# Patient Record
Sex: Female | Born: 1955 | Race: White | Hispanic: No | Marital: Married | Smoking: Never smoker
Health system: Southern US, Community
[De-identification: ages and names within clinical notes are randomized; demographics above are authoritative.]

## PROBLEM LIST (undated history)

## (undated) DIAGNOSIS — E669 Obesity, unspecified: Secondary | ICD-10-CM

## (undated) DIAGNOSIS — T7840XA Allergy, unspecified, initial encounter: Secondary | ICD-10-CM

## (undated) DIAGNOSIS — E039 Hypothyroidism, unspecified: Secondary | ICD-10-CM

## (undated) DIAGNOSIS — I7300 Raynaud's syndrome without gangrene: Secondary | ICD-10-CM

## (undated) DIAGNOSIS — M1990 Unspecified osteoarthritis, unspecified site: Secondary | ICD-10-CM

## (undated) DIAGNOSIS — I10 Essential (primary) hypertension: Secondary | ICD-10-CM

## (undated) DIAGNOSIS — R0989 Other specified symptoms and signs involving the circulatory and respiratory systems: Secondary | ICD-10-CM

## (undated) DIAGNOSIS — Z8619 Personal history of other infectious and parasitic diseases: Secondary | ICD-10-CM

## (undated) DIAGNOSIS — K5900 Constipation, unspecified: Secondary | ICD-10-CM

## (undated) DIAGNOSIS — R739 Hyperglycemia, unspecified: Secondary | ICD-10-CM

## (undated) DIAGNOSIS — N3281 Overactive bladder: Secondary | ICD-10-CM

## (undated) DIAGNOSIS — Z0000 Encounter for general adult medical examination without abnormal findings: Secondary | ICD-10-CM

## (undated) DIAGNOSIS — F411 Generalized anxiety disorder: Secondary | ICD-10-CM

## (undated) DIAGNOSIS — E1169 Type 2 diabetes mellitus with other specified complication: Secondary | ICD-10-CM

## (undated) DIAGNOSIS — E1165 Type 2 diabetes mellitus with hyperglycemia: Secondary | ICD-10-CM

## (undated) HISTORY — DX: Type 2 diabetes mellitus with hyperglycemia: E11.65

## (undated) HISTORY — DX: Other specified symptoms and signs involving the circulatory and respiratory systems: R09.89

## (undated) HISTORY — DX: Raynaud's syndrome without gangrene: I73.00

## (undated) HISTORY — DX: Constipation, unspecified: K59.00

## (undated) HISTORY — DX: Overactive bladder: N32.81

## (undated) HISTORY — DX: Obesity, unspecified: E66.9

## (undated) HISTORY — DX: Personal history of other infectious and parasitic diseases: Z86.19

## (undated) HISTORY — PX: TONSILLECTOMY: SHX5217

## (undated) HISTORY — DX: Allergy, unspecified, initial encounter: T78.40XA

## (undated) HISTORY — DX: Generalized anxiety disorder: F41.1

## (undated) HISTORY — DX: Essential (primary) hypertension: I10

## (undated) HISTORY — DX: Hyperglycemia, unspecified: R73.9

## (undated) HISTORY — DX: Type 2 diabetes mellitus with other specified complication: E11.69

## (undated) HISTORY — DX: Hypothyroidism, unspecified: E03.9

## (undated) HISTORY — DX: Unspecified osteoarthritis, unspecified site: M19.90

## (undated) HISTORY — DX: Encounter for general adult medical examination without abnormal findings: Z00.00

---

## 2000-02-17 ENCOUNTER — Other Ambulatory Visit: Admission: RE | Admit: 2000-02-17 | Discharge: 2000-02-17 | Payer: Self-pay | Admitting: Family Medicine

## 2000-12-14 HISTORY — PX: ABDOMINAL HYSTERECTOMY: SHX81

## 2010-02-24 LAB — HM COLONOSCOPY: HM Colonoscopy: NORMAL

## 2010-12-16 LAB — HM PAP SMEAR: HM Pap smear: NORMAL

## 2011-02-16 LAB — HM MAMMOGRAPHY: HM Mammogram: NORMAL

## 2012-02-09 ENCOUNTER — Telehealth: Payer: Self-pay | Admitting: Internal Medicine

## 2012-02-09 ENCOUNTER — Encounter: Payer: Self-pay | Admitting: Internal Medicine

## 2012-02-09 ENCOUNTER — Ambulatory Visit (INDEPENDENT_AMBULATORY_CARE_PROVIDER_SITE_OTHER): Payer: BC Managed Care – PPO | Admitting: Internal Medicine

## 2012-02-09 VITALS — BP 112/80 | HR 87 | Temp 98.3°F | Resp 18 | Ht 62.25 in | Wt 187.0 lb

## 2012-02-09 DIAGNOSIS — E039 Hypothyroidism, unspecified: Secondary | ICD-10-CM

## 2012-02-09 DIAGNOSIS — G5600 Carpal tunnel syndrome, unspecified upper limb: Secondary | ICD-10-CM

## 2012-02-09 DIAGNOSIS — Z1211 Encounter for screening for malignant neoplasm of colon: Secondary | ICD-10-CM | POA: Insufficient documentation

## 2012-02-09 DIAGNOSIS — L719 Rosacea, unspecified: Secondary | ICD-10-CM

## 2012-02-09 DIAGNOSIS — Z0000 Encounter for general adult medical examination without abnormal findings: Secondary | ICD-10-CM

## 2012-02-09 DIAGNOSIS — M549 Dorsalgia, unspecified: Secondary | ICD-10-CM

## 2012-02-09 DIAGNOSIS — J45909 Unspecified asthma, uncomplicated: Secondary | ICD-10-CM

## 2012-02-09 DIAGNOSIS — G8929 Other chronic pain: Secondary | ICD-10-CM | POA: Insufficient documentation

## 2012-02-09 HISTORY — DX: Encounter for general adult medical examination without abnormal findings: Z00.00

## 2012-02-09 LAB — CBC WITH DIFFERENTIAL/PLATELET
Basophils Absolute: 0.1 10*3/uL (ref 0.0–0.1)
Basophils Relative: 1 % (ref 0–1)
MCHC: 34.6 g/dL (ref 30.0–36.0)
Monocytes Absolute: 0.7 10*3/uL (ref 0.1–1.0)
Neutro Abs: 5 10*3/uL (ref 1.7–7.7)
Neutrophils Relative %: 65 % (ref 43–77)
RDW: 12.9 % (ref 11.5–15.5)

## 2012-02-09 LAB — BASIC METABOLIC PANEL
Glucose, Bld: 173 mg/dL — ABNORMAL HIGH (ref 70–99)
Potassium: 4.4 mEq/L (ref 3.5–5.3)
Sodium: 138 mEq/L (ref 135–145)

## 2012-02-09 LAB — HEPATIC FUNCTION PANEL
ALT: 34 U/L (ref 0–35)
Albumin: 4.2 g/dL (ref 3.5–5.2)
Bilirubin, Direct: 0.2 mg/dL (ref 0.0–0.3)
Total Protein: 6.4 g/dL (ref 6.0–8.3)

## 2012-02-09 LAB — LIPID PANEL
Cholesterol: 175 mg/dL (ref 0–200)
HDL: 38 mg/dL — ABNORMAL LOW (ref >39)
Total CHOL/HDL Ratio: 4.6 Ratio
VLDL: 23 mg/dL (ref 0–40)

## 2012-02-09 LAB — TSH: TSH: 2.628 u[IU]/mL (ref 0.350–4.500)

## 2012-02-09 LAB — VITAMIN B12: Vitamin B-12: 214 pg/mL (ref 211–911)

## 2012-02-09 MED ORDER — DICLOFENAC SODIUM 50 MG PO TBEC: DELAYED_RELEASE_TABLET | ORAL | Status: AC

## 2012-02-09 NOTE — Assessment & Plan Note (Signed)
Obtain tsh and free t4 

## 2012-02-09 NOTE — Progress Notes (Signed)
  Subjective:    Patient ID: Tiffany Mckenzie, female    DOB: 06-02-1956, 56 y.o.   MRN: [id number]  HPI Pt presents to clinic for annual physical and evaluation of multiple medical problems. Notes h/o chronic lbp without radicular sx's, paresthesia or weakness. Has been tx'ed by two chiropractors and underwent plain radiographs within past several months. Was told she had arthritis/degeneration. Sometimes takes aleve prn. Has not undergone PT. Also complains of intermittent numbness of hands worse at night. No neck pain or radicular arm pain. No other alleviating or exacerbating factors. Mammogram and pap smear utd through gyn.   Past Medical History  Diagnosis Date  . Asthma   . Arthritis   . History of chicken pox   . Allergic rhinitis   . History of hepatitis     age 60  . Hypertension   . Hypothyroidism    Past Surgical History  Procedure Date  . Tonsillectomy   . Abdominal hysterectomy 2002    reports that she has quit smoking. She has never used smokeless tobacco. She reports that she does not drink alcohol or use illicit drugs. family history includes Alcohol abuse in her father; Arthritis in her mother; Breast cancer in her maternal grandmother; Colon cancer in her paternal grandmother; Heart disease in her maternal grandfather and mother; Hypertension in her mother; and Stroke in her paternal grandmother. Allergies  Allergen Reactions  . Biaxin     Rash  . Codeine     vomiting    Review of Systems  Musculoskeletal: Positive for back pain and arthralgias.  Neurological: Positive for numbness. Negative for weakness.  All other systems reviewed and are negative.       Objective:   Physical Exam  Nursing note and vitals reviewed. Constitutional: She appears well-developed and well-nourished. No distress.  HENT:  Head: Normocephalic and atraumatic.  Right Ear: External ear normal.  Left Ear: External ear normal.  Nose: Nose normal.  Mouth/Throat: Oropharynx is clear and  moist. No oropharyngeal exudate.  Eyes: Conjunctivae and EOM are normal. Pupils are equal, round, and reactive to light. No scleral icterus.  Neck: Neck supple. No JVD present. No thyromegaly present.  Cardiovascular: Normal rate, regular rhythm and normal heart sounds.  Exam reveals no gallop and no friction rub.   No murmur heard. Pulmonary/Chest: Effort normal and breath sounds normal. No respiratory distress. She has no wheezes. She has no rales.  Abdominal: Soft. Bowel sounds are normal. She exhibits no distension and no mass. There is no hepatosplenomegaly. There is no tenderness. There is no rebound and no guarding.  Musculoskeletal:       No midline ls tenderness or bony abn. Mild tenderness to palpation at left lower paraspinal muscle insertion. Gait nl. +phalen's left. No thenar muscle wasting.  Lymphadenopathy:    She has no cervical adenopathy.  Neurological: She is alert.  Skin: Skin is warm and dry. She is not diaphoretic.  Psychiatric: She has a normal mood and affect.          Assessment & Plan:

## 2012-02-09 NOTE — Telephone Encounter (Signed)
Lab orders entered for August of 2013.

## 2012-02-09 NOTE — Assessment & Plan Note (Signed)
Nl exam. Obtain cpe labs. EKG obtained demonstrates nsr 86 with nl intervals and axis. Mammogram and pap smear utd through gyn

## 2012-02-09 NOTE — Assessment & Plan Note (Signed)
Attempt bilateral wrist splints at night. Followup if no improvement or worsening.

## 2012-02-09 NOTE — Assessment & Plan Note (Signed)
Stop aleve and attempt voltaren prn with food and no other nsaids. Recommend PT- states she will consider. Followup if no improvement or worsening.

## 2012-02-09 NOTE — Patient Instructions (Signed)
Please schedule tsh and free t4 prior to next visit (dx-hypothyroidism)

## 2012-02-10 LAB — URINALYSIS, MICROSCOPIC ONLY: Bacteria, UA: NONE SEEN

## 2012-02-10 LAB — URINALYSIS, ROUTINE W REFLEX MICROSCOPIC
Bilirubin Urine: NEGATIVE
Hgb urine dipstick: NEGATIVE
Protein, ur: NEGATIVE mg/dL
Urobilinogen, UA: 0.2 mg/dL (ref 0.0–1.0)

## 2012-02-18 ENCOUNTER — Telehealth: Payer: Self-pay | Admitting: *Deleted

## 2012-02-18 MED ORDER — OLMESARTAN MEDOXOMIL 20 MG PO TABS: 20.0000 mg | ORAL_TABLET | Freq: Every day | ORAL | Status: DC

## 2012-02-18 NOTE — Telephone Encounter (Signed)
Call placed to patient regarding lab results. She was informed per Dr Rodena Medin instructions and has verbalized understanding. She has requested a refill on Benicar for a 90 day supply to her pharmacy on file.  Rx sent

## 2012-03-22 ENCOUNTER — Other Ambulatory Visit: Payer: Self-pay | Admitting: *Deleted

## 2012-03-22 MED ORDER — LEVOTHYROXINE SODIUM 100 MCG PO TABS: 100.0000 ug | ORAL_TABLET | Freq: Every day | ORAL | Status: DC

## 2012-03-22 NOTE — Telephone Encounter (Signed)
Patient called and left voice message requesting a refill on Levothyroxine. Call was returned to patient to verify refill. She stated she has 2 days remaining and has requested the Rx be sent to pharmacy on file. Rx refill sent to pharmacy.

## 2012-05-11 ENCOUNTER — Telehealth: Payer: Self-pay | Admitting: Internal Medicine

## 2012-05-11 MED ORDER — LEVOTHYROXINE SODIUM 100 MCG PO TABS: 100.0000 ug | ORAL_TABLET | Freq: Every day | ORAL | Status: DC

## 2012-05-11 NOTE — Telephone Encounter (Signed)
Rx refill sent to pharmacy. 

## 2012-05-11 NOTE — Telephone Encounter (Signed)
90 day request  Levothyroxine 

## 2012-08-08 ENCOUNTER — Ambulatory Visit: Payer: BC Managed Care – PPO | Admitting: Internal Medicine

## 2012-08-15 ENCOUNTER — Other Ambulatory Visit: Payer: Self-pay | Admitting: Internal Medicine

## 2012-08-16 NOTE — Telephone Encounter (Signed)
Done/SLS 

## 2012-09-08 NOTE — Telephone Encounter (Signed)
Lab orders released/SLS 

## 2012-09-08 NOTE — Addendum Note (Signed)
Addended by: Regis Bill on: 09/08/2012 03:13 PM   Modules accepted: Orders

## 2012-09-13 ENCOUNTER — Ambulatory Visit (INDEPENDENT_AMBULATORY_CARE_PROVIDER_SITE_OTHER): Payer: BC Managed Care – PPO | Admitting: Internal Medicine

## 2012-09-13 ENCOUNTER — Encounter: Payer: Self-pay | Admitting: Internal Medicine

## 2012-09-13 VITALS — BP 122/78 | HR 83 | Temp 98.0°F | Resp 16 | Wt 188.2 lb

## 2012-09-13 DIAGNOSIS — E538 Deficiency of other specified B group vitamins: Secondary | ICD-10-CM

## 2012-09-13 DIAGNOSIS — R739 Hyperglycemia, unspecified: Secondary | ICD-10-CM

## 2012-09-13 DIAGNOSIS — E669 Obesity, unspecified: Secondary | ICD-10-CM | POA: Insufficient documentation

## 2012-09-13 DIAGNOSIS — E119 Type 2 diabetes mellitus without complications: Secondary | ICD-10-CM | POA: Insufficient documentation

## 2012-09-13 DIAGNOSIS — G8929 Other chronic pain: Secondary | ICD-10-CM

## 2012-09-13 DIAGNOSIS — R7309 Other abnormal glucose: Secondary | ICD-10-CM

## 2012-09-13 DIAGNOSIS — E1169 Type 2 diabetes mellitus with other specified complication: Secondary | ICD-10-CM

## 2012-09-13 DIAGNOSIS — Z23 Encounter for immunization: Secondary | ICD-10-CM

## 2012-09-13 DIAGNOSIS — IMO0001 Reserved for inherently not codable concepts without codable children: Secondary | ICD-10-CM

## 2012-09-13 DIAGNOSIS — M549 Dorsalgia, unspecified: Secondary | ICD-10-CM

## 2012-09-13 HISTORY — DX: Reserved for inherently not codable concepts without codable children: IMO0001

## 2012-09-13 HISTORY — DX: Type 2 diabetes mellitus with other specified complication: E66.9

## 2012-09-13 HISTORY — DX: Hyperglycemia, unspecified: R73.9

## 2012-09-13 HISTORY — DX: Type 2 diabetes mellitus with other specified complication: E11.69

## 2012-09-13 NOTE — Patient Instructions (Signed)
Please schedule non fasting labs in 3-4 weeks a1c-hyperglycemia and b12-b12 def Also please schedule tsh/free t4 -hypothyroidism prior to next visit

## 2012-09-13 NOTE — Assessment & Plan Note (Signed)
Isolated elevation. Obtain A1c when returns to lab in 3-4 weeks.

## 2012-09-13 NOTE — Assessment & Plan Note (Signed)
Proceed with physical therapy.

## 2012-09-13 NOTE — Assessment & Plan Note (Signed)
Take B12 1000 mg daily and check B12 level in approximately 3-4 weeks

## 2012-09-13 NOTE — Progress Notes (Signed)
  Subjective:    Patient ID: Tiffany Mckenzie, female    DOB: Aug 08, 1956, 56 y.o.   MRN: [id number]  HPI Pt presents to clinic for followup of multiple medical problems. Reviewed normal TSH and free T4 in the setting of hyperthyroidism. Reviewed past B12 level depressed. States has difficulty remembering to take her B12 and does not do so a regular basis. Continues to suffer from chronic lumbar back pain status post chiropractic evaluation and plain radiographs from chiropractor. Notes intermittent radiation to the left upper leg without weakness. Has not undergone physical therapy or MRI. Blood pressure reviewed as normotensive  Past Medical History  Diagnosis Date  . Asthma   . Arthritis   . History of chicken pox   . Allergic rhinitis   . History of hepatitis     age 56  . Hypertension   . Hypothyroidism    Past Surgical History  Procedure Date  . Tonsillectomy   . Abdominal hysterectomy 2002    reports that she has quit smoking. She has never used smokeless tobacco. She reports that she does not drink alcohol or use illicit drugs. family history includes Alcohol abuse in her father; Arthritis in her mother; Breast cancer in her maternal grandmother; Colon cancer in her paternal grandmother; Heart disease in her maternal grandfather and mother; Hypertension in her mother; and Stroke in her paternal grandmother. Allergies  Allergen Reactions  . Clarithromycin     Rash  . Codeine     vomiting      Review of Systems see hpi     Objective:   Physical Exam  Physical Exam  Nursing note and vitals reviewed. Constitutional: Appears well-developed and well-nourished. No distress.  HENT:  Head: Normocephalic and atraumatic.  Right Ear: External ear normal.  Left Ear: External ear normal.  Eyes: Conjunctivae are normal. No scleral icterus.  Neck: Neck supple. Carotid bruit is not present.  Cardiovascular: Normal rate, regular rhythm and normal heart sounds.  Exam reveals no gallop and  no friction rub.   No murmur heard. Pulmonary/Chest: Effort normal and breath sounds normal. No respiratory distress. He has no wheezes. no rales.  Lymphadenopathy:    He has no cervical adenopathy.  Neurological:Alert.  Skin: Skin is warm and dry. Not diaphoretic.  Psychiatric: Has a normal mood and affect.        Assessment & Plan:

## 2012-09-13 NOTE — Assessment & Plan Note (Signed)
>>  ASSESSMENT AND PLAN FOR TYPE 2 DIABETES MELLITUS WITH OBESITY WRITTEN ON 09/13/2012 10:28 AM BY HODGIN, DEBBY ORN, MD  Isolated elevation. Obtain A1c when returns to lab in 3-4 weeks.

## 2012-09-14 DIAGNOSIS — Z23 Encounter for immunization: Secondary | ICD-10-CM

## 2012-09-22 ENCOUNTER — Ambulatory Visit: Payer: BC Managed Care – PPO | Attending: Internal Medicine | Admitting: Physical Therapy

## 2012-09-22 DIAGNOSIS — M545 Low back pain, unspecified: Secondary | ICD-10-CM | POA: Insufficient documentation

## 2012-09-22 DIAGNOSIS — IMO0001 Reserved for inherently not codable concepts without codable children: Secondary | ICD-10-CM | POA: Insufficient documentation

## 2012-09-26 ENCOUNTER — Ambulatory Visit: Payer: BC Managed Care – PPO | Admitting: Rehabilitation

## 2012-09-28 ENCOUNTER — Ambulatory Visit: Payer: BC Managed Care – PPO | Admitting: Physical Therapy

## 2012-10-04 ENCOUNTER — Ambulatory Visit: Payer: BC Managed Care – PPO | Admitting: Physical Therapy

## 2012-10-05 ENCOUNTER — Ambulatory Visit: Payer: BC Managed Care – PPO | Admitting: Physical Therapy

## 2012-10-10 ENCOUNTER — Ambulatory Visit: Payer: BC Managed Care – PPO | Admitting: Rehabilitation

## 2012-10-12 ENCOUNTER — Ambulatory Visit: Payer: BC Managed Care – PPO | Admitting: Rehabilitation

## 2012-10-17 ENCOUNTER — Ambulatory Visit: Payer: BC Managed Care – PPO | Attending: Internal Medicine | Admitting: Rehabilitation

## 2012-10-17 DIAGNOSIS — IMO0001 Reserved for inherently not codable concepts without codable children: Secondary | ICD-10-CM | POA: Insufficient documentation

## 2012-10-17 DIAGNOSIS — M545 Low back pain, unspecified: Secondary | ICD-10-CM | POA: Insufficient documentation

## 2012-10-19 ENCOUNTER — Ambulatory Visit: Payer: BC Managed Care – PPO | Admitting: Rehabilitation

## 2012-10-24 ENCOUNTER — Encounter: Payer: BC Managed Care – PPO | Admitting: Rehabilitation

## 2012-10-25 ENCOUNTER — Ambulatory Visit: Payer: BC Managed Care – PPO | Admitting: Rehabilitation

## 2012-10-26 ENCOUNTER — Ambulatory Visit: Payer: BC Managed Care – PPO | Admitting: Physical Therapy

## 2012-10-31 ENCOUNTER — Ambulatory Visit: Payer: BC Managed Care – PPO | Admitting: Physical Therapy

## 2012-11-02 ENCOUNTER — Ambulatory Visit: Payer: BC Managed Care – PPO | Admitting: Rehabilitation

## 2012-11-08 ENCOUNTER — Ambulatory Visit: Payer: BC Managed Care – PPO | Admitting: Physical Therapy

## 2012-11-14 ENCOUNTER — Ambulatory Visit: Payer: BC Managed Care – PPO | Attending: Internal Medicine | Admitting: Physical Therapy

## 2012-11-14 DIAGNOSIS — M545 Low back pain, unspecified: Secondary | ICD-10-CM | POA: Insufficient documentation

## 2012-11-14 DIAGNOSIS — IMO0001 Reserved for inherently not codable concepts without codable children: Secondary | ICD-10-CM | POA: Insufficient documentation

## 2012-11-15 ENCOUNTER — Other Ambulatory Visit: Payer: Self-pay | Admitting: Internal Medicine

## 2012-11-15 NOTE — Telephone Encounter (Signed)
Rx to pharmacy/SLS 

## 2012-11-16 ENCOUNTER — Ambulatory Visit: Payer: BC Managed Care – PPO | Admitting: Physical Therapy

## 2012-11-21 ENCOUNTER — Ambulatory Visit: Payer: BC Managed Care – PPO | Admitting: Physical Therapy

## 2012-11-23 ENCOUNTER — Ambulatory Visit: Payer: BC Managed Care – PPO | Admitting: Physical Therapy

## 2012-11-28 ENCOUNTER — Ambulatory Visit: Payer: BC Managed Care – PPO | Admitting: Physical Therapy

## 2012-11-30 ENCOUNTER — Ambulatory Visit: Payer: BC Managed Care – PPO | Admitting: Physical Therapy

## 2012-12-05 ENCOUNTER — Ambulatory Visit: Payer: BC Managed Care – PPO | Admitting: Physical Therapy

## 2012-12-08 ENCOUNTER — Ambulatory Visit: Payer: BC Managed Care – PPO | Admitting: Physical Therapy

## 2013-02-14 ENCOUNTER — Other Ambulatory Visit: Payer: Self-pay | Admitting: Internal Medicine

## 2013-03-14 ENCOUNTER — Ambulatory Visit: Payer: BC Managed Care – PPO | Admitting: Family Medicine

## 2013-03-16 ENCOUNTER — Encounter: Payer: Self-pay | Admitting: Family Medicine

## 2013-03-16 ENCOUNTER — Ambulatory Visit (INDEPENDENT_AMBULATORY_CARE_PROVIDER_SITE_OTHER): Payer: BC Managed Care – PPO | Admitting: Family Medicine

## 2013-03-16 ENCOUNTER — Other Ambulatory Visit: Payer: Self-pay | Admitting: Family Medicine

## 2013-03-16 VITALS — BP 110/62 | HR 98 | Temp 98.1°F | Ht 62.25 in | Wt 185.1 lb

## 2013-03-16 DIAGNOSIS — N3281 Overactive bladder: Secondary | ICD-10-CM

## 2013-03-16 DIAGNOSIS — N318 Other neuromuscular dysfunction of bladder: Secondary | ICD-10-CM

## 2013-03-16 DIAGNOSIS — J45909 Unspecified asthma, uncomplicated: Secondary | ICD-10-CM

## 2013-03-16 DIAGNOSIS — M549 Dorsalgia, unspecified: Secondary | ICD-10-CM

## 2013-03-16 DIAGNOSIS — E538 Deficiency of other specified B group vitamins: Secondary | ICD-10-CM

## 2013-03-16 DIAGNOSIS — R739 Hyperglycemia, unspecified: Secondary | ICD-10-CM

## 2013-03-16 DIAGNOSIS — G8929 Other chronic pain: Secondary | ICD-10-CM

## 2013-03-16 DIAGNOSIS — E785 Hyperlipidemia, unspecified: Secondary | ICD-10-CM

## 2013-03-16 DIAGNOSIS — R7309 Other abnormal glucose: Secondary | ICD-10-CM

## 2013-03-16 DIAGNOSIS — E039 Hypothyroidism, unspecified: Secondary | ICD-10-CM

## 2013-03-16 LAB — CBC
HCT: 40.4 % (ref 36.0–46.0)
Hemoglobin: 14 g/dL (ref 12.0–15.0)
MCH: 29.8 pg (ref 26.0–34.0)
MCV: 86 fL (ref 78.0–100.0)
RBC: 4.7 MIL/uL (ref 3.87–5.11)

## 2013-03-16 NOTE — Patient Instructions (Addendum)
Next visit annual with labs prior renal, hepatic, tsh, cbc, hgba1c, lipid  DASH Diet The DASH diet stands for "Dietary Approaches to Stop Hypertension." It is a healthy eating plan that has been shown to reduce high blood pressure (hypertension) in as little as 14 days, while also possibly providing other significant health benefits. These other health benefits include reducing the risk of breast cancer after menopause and reducing the risk of type 2 diabetes, heart disease, colon cancer, and stroke. Health benefits also include weight loss and slowing kidney failure in patients with chronic kidney disease.  DIET GUIDELINES  Limit salt (sodium). Your diet should contain less than 1500 mg of sodium daily.  Limit refined or processed carbohydrates. Your diet should include mostly whole grains. Desserts and added sugars should be used sparingly.  Include small amounts of heart-healthy fats. These types of fats include nuts, oils, and tub margarine. Limit saturated and trans fats. These fats have been shown to be harmful in the body. CHOOSING FOODS  The following food groups are based on a 2000 calorie diet. See your Registered Dietitian for individual calorie needs. Grains and Grain Products (6 to 8 servings daily)  Eat More Often: Whole-wheat bread, brown rice, whole-grain or wheat pasta, quinoa, popcorn without added fat or salt (air popped).  Eat Less Often: White bread, white pasta, white rice, cornbread. Vegetables (4 to 5 servings daily)  Eat More Often: Fresh, frozen, and canned vegetables. Vegetables may be raw, steamed, roasted, or grilled with a minimal amount of fat.  Eat Less Often/Avoid: Creamed or fried vegetables. Vegetables in a cheese sauce. Fruit (4 to 5 servings daily)  Eat More Often: All fresh, canned (in natural juice), or frozen fruits. Dried fruits without added sugar. One hundred percent fruit juice ( cup [237 mL] daily).  Eat Less Often: Dried fruits with added  sugar. Canned fruit in light or heavy syrup. [employer], Fish, and Poultry (2 servings or less daily. One serving is 3 to 4 oz [85-114 g]).  Eat More Often: Ninety percent or leaner ground beef, tenderloin, sirloin. Round cuts of beef, chicken breast, [place] breast. All fish. Grill, bake, or broil your meat. Nothing should be fried.  Eat Less Often/Avoid: Fatty cuts of meat, [place], or chicken leg, thigh, or wing. Fried cuts of meat or fish. Dairy (2 to 3 servings)  Eat More Often: Low-fat or fat-free milk, low-fat plain or light yogurt, reduced-fat or part-skim cheese.  Eat Less Often/Avoid: Milk (whole, 2%).Whole milk yogurt. Full-fat cheeses. Nuts, Seeds, and Legumes (4 to 5 servings per week)  Eat More Often: All without added salt.  Eat Less Often/Avoid: Salted nuts and seeds, canned beans with added salt. Fats and Sweets (limited)  Eat More Often: Vegetable oils, tub margarines without trans fats, sugar-free gelatin. Mayonnaise and salad dressings.  Eat Less Often/Avoid: Coconut oils, palm oils, butter, stick margarine, cream, half and half, cookies, candy, pie. FOR MORE INFORMATION The Dash Diet Eating Plan: www.dashdiet.org Document Released: 11/19/2011 Document Revised: 02/22/2012 Document Reviewed: 11/19/2011 [facility] Patient Information 2013 [place], [place].

## 2013-03-17 ENCOUNTER — Other Ambulatory Visit: Payer: Self-pay

## 2013-03-17 ENCOUNTER — Encounter: Payer: Self-pay | Admitting: Family Medicine

## 2013-03-17 DIAGNOSIS — N3281 Overactive bladder: Secondary | ICD-10-CM | POA: Insufficient documentation

## 2013-03-17 HISTORY — DX: Overactive bladder: N32.81

## 2013-03-17 LAB — HEPATIC FUNCTION PANEL
ALT: 31 U/L (ref 0–35)
Albumin: 4.4 g/dL (ref 3.5–5.2)
Total Protein: 6.3 g/dL (ref 6.0–8.3)

## 2013-03-17 LAB — BASIC METABOLIC PANEL
CO2: 32 mEq/L (ref 19–32)
Calcium: 9.3 mg/dL (ref 8.4–10.5)
Chloride: 100 mEq/L (ref 96–112)
Potassium: 4.2 mEq/L (ref 3.5–5.3)
Sodium: 139 mEq/L (ref 135–145)

## 2013-03-17 LAB — HEMOGLOBIN A1C: Mean Plasma Glucose: 217 mg/dL — ABNORMAL HIGH (ref <117)

## 2013-03-17 LAB — TSH: TSH: 2.403 u[IU]/mL (ref 0.350–4.500)

## 2013-03-17 LAB — LIPID PANEL
Cholesterol: 189 mg/dL (ref 0–200)
Triglycerides: 126 mg/dL (ref <150)

## 2013-03-17 NOTE — Assessment & Plan Note (Signed)
>>  ASSESSMENT AND PLAN FOR TYPE 2 DIABETES MELLITUS WITH OBESITY WRITTEN ON 03/17/2013  1:16 PM BY BLYTH, STACEY A, MD  hgba1c returned at 9.2 after visit. Patient started on metformin  and offered a nutrition consult. Minimize simple carbs and increase exercise

## 2013-03-17 NOTE — Telephone Encounter (Signed)
Opened in error

## 2013-03-17 NOTE — Assessment & Plan Note (Signed)
Patient reports the urologist started her on a med.  Cannot say for sure it is working yet

## 2013-03-17 NOTE — Progress Notes (Signed)
Patient ID: Tiffany Mckenzie, female   DOB: 07/08/1956, 57 y.o.   MRN: [id number] Tiffany Mckenzie [id number] 1956-05-28 03/17/2013      Progress Note-Follow Up  Subjective  Chief Complaint  Chief Complaint  Patient presents with  . Follow-up    HPI  Patient is a 57 year old Caucasian female who is here today in followup. She's recently finished a course of physical therapy and did find that helpful for her back. She's fallen with the urologist and they've recently started her on a medication whose name she can't remember. The medication was for overactive bladder and she does think it's helping some. Otherwise she reports feeling well. No recent illness. No fevers or chills. No chest pain, palpitations, shortness of breath, GI complaints noted today  Past Medical History  Diagnosis Date  . Asthma   . Arthritis   . History of chicken pox   . Allergic rhinitis   . History of hepatitis     age 43  . Hypertension   . Hypothyroidism   . Hyperglycemia 09/13/2012  . Type II or unspecified type diabetes mellitus without mention of complication, uncontrolled 09/13/2012  . Overactive bladder 03/17/2013    Seeing urology    Past Surgical History  Procedure Laterality Date  . Tonsillectomy    . Abdominal hysterectomy  2002    Family History  Problem Relation Age of Onset  . Alcohol abuse Father   . Cancer Father     lung  . Diabetes Father   . Arthritis Mother   . Heart disease Mother     Blockage & double bypass  . Hypertension Mother   . Diabetes Mother   . Colon cancer Paternal Grandmother   . Stroke Paternal Grandmother     maternal grandfather  . Breast cancer Maternal Grandmother   . Heart disease Maternal Grandfather   . Scoliosis Daughter   . Cancer Paternal Grandfather     lung    History   Social History  . Marital Status: Married    Spouse Name: N/A    Number of Children: N/A  . Years of Education: N/A   Occupational History  . Not on file.   Social History Main  Topics  . Smoking status: Former Games developer  . Smokeless tobacco: Never Used  . Alcohol Use: No  . Drug Use: No  . Sexually Active: Not on file   Other Topics Concern  . Not on file   Social History Narrative  . No narrative on file    Current Outpatient Prescriptions on File Prior to Visit  Medication Sig Dispense Refill  . albuterol (VENTOLIN HFA) 108 (90 BASE) MCG/ACT inhaler Inhale 2 puffs into the lungs every 6 (six) hours as needed.      Marland Kitchen BENICAR 20 MG tablet TAKE 1 TABLET EVERY DAY  90 tablet  1  . Cyanocobalamin (VITAMIN B 12 PO) Take 1,000 mcg by mouth daily.      Marland Kitchen estradiol (ESTRACE) 1 MG tablet Take 1 mg by mouth daily.      . fluocinonide cream (LIDEX) 0.05 % Apply topically 2 (two) times daily.      Marland Kitchen levothyroxine (SYNTHROID, LEVOTHROID) 100 MCG tablet TAKE 1 TABLET EVERY DAY  90 tablet  1  . LORazepam (ATIVAN) 0.5 MG tablet Take 0.5 mg by mouth 2 (two) times daily as needed.       No current facility-administered medications on file prior to visit.    Allergies  Allergen Reactions  . Clarithromycin  Rash  . Codeine     vomiting    Review of Systems  Review of Systems  Constitutional: Negative for fever and malaise/fatigue.  HENT: Negative for congestion.   Eyes: Negative for discharge.  Respiratory: Negative for shortness of breath.   Cardiovascular: Negative for chest pain, palpitations and leg swelling.  Gastrointestinal: Negative for nausea, abdominal pain and diarrhea.  Genitourinary: Positive for frequency. Negative for dysuria.  Musculoskeletal: Positive for back pain. Negative for falls.  Skin: Negative for rash.  Neurological: Negative for loss of consciousness and headaches.  Endo/Heme/Allergies: Negative for polydipsia.  Psychiatric/Behavioral: Negative for depression and suicidal ideas. The patient is not nervous/anxious and does not have insomnia.     Objective  BP 110/62  Pulse 98  Temp(Src) 98.1 F (36.7 C) (Oral)  Ht 5' 2.25"  (1.581 m)  Wt 185 lb 1.9 oz (83.97 kg)  BMI 33.59 kg/m2  SpO2 94%  Physical Exam  Physical Exam  Constitutional: She is oriented to person, place, and time and well-developed, well-nourished, and in no distress. No distress.  HENT:  Head: Normocephalic and atraumatic.  Eyes: Conjunctivae are normal.  Neck: Neck supple. No thyromegaly present.  Cardiovascular: Normal rate, regular rhythm and normal heart sounds.   No murmur heard. Pulmonary/Chest: Effort normal and breath sounds normal. She has no wheezes.  Abdominal: She exhibits no distension and no mass.  Musculoskeletal: She exhibits no edema.  Lymphadenopathy:    She has no cervical adenopathy.  Neurological: She is alert and oriented to person, place, and time.  Skin: Skin is warm and dry. No rash noted. She is not diaphoretic.  Psychiatric: Memory, affect and judgment normal.    Lab Results  Component Value Date   TSH 2.403 03/16/2013   Lab Results  Component Value Date   WBC 7.6 03/16/2013   HGB 14.0 03/16/2013   HCT 40.4 03/16/2013   MCV 86.0 03/16/2013   PLT 247 03/16/2013   Lab Results  Component Value Date   CREATININE 0.68 03/16/2013   BUN 14 03/16/2013   NA 139 03/16/2013   K 4.2 03/16/2013   CL 100 03/16/2013   CO2 32 03/16/2013   Lab Results  Component Value Date   ALT 31 03/16/2013   AST 18 03/16/2013   ALKPHOS 138* 03/16/2013   BILITOT 0.5 03/16/2013   Lab Results  Component Value Date   CHOL 189 03/16/2013   Lab Results  Component Value Date   HDL 39* 03/16/2013   Lab Results  Component Value Date   LDLCALC 125* 03/16/2013   Lab Results  Component Value Date   TRIG 126 03/16/2013   Lab Results  Component Value Date   CHOLHDL 4.8 03/16/2013     Assessment & Plan  Type II or unspecified type diabetes mellitus without mention of complication, uncontrolled hgba1c returned at 9.2 after visit. Patient started on metformin and offered a nutrition consult. Minimize simple carbs and increase  exercise  Hypothyroidism Thyroid labs wnl, no changes  Chronic back pain Improved with physical therapy, encouraged ongoing physical activity.  Overactive bladder Patient reports the urologist started her on a med.  Cannot say for sure it is working yet  Asthma No recent need for albuterol

## 2013-03-17 NOTE — Assessment & Plan Note (Signed)
No recent need for albuterol 

## 2013-03-17 NOTE — Assessment & Plan Note (Signed)
Thyroid labs wnl, no changes

## 2013-03-17 NOTE — Assessment & Plan Note (Signed)
hgba1c returned at 9.2 after visit. Patient started on metformin and offered a nutrition consult. Minimize simple carbs and increase exercise

## 2013-03-17 NOTE — Assessment & Plan Note (Signed)
Improved with physical therapy, encouraged ongoing physical activity.

## 2013-03-23 ENCOUNTER — Telehealth: Payer: Self-pay | Admitting: *Deleted

## 2013-03-23 NOTE — Telephone Encounter (Signed)
Message copied by Regis Bill on Thu Mar 23, 2013  5:04 PM ------      Message from: Danise Edge A      Created: Fri Mar 17, 2013  8:23 AM       Notify hgba1c is up to 9.2 which means she is officially diabetic, needs to minimize carbs, sheis welcome to come in here to discuss and/or we can refer to nutrition consult. She should start Metformin 500 mg po bid, disp #60 3 rf. Warn her about diarrhea and stomach upset and let her know it usually clears up some in 1-2 weeks ------

## 2013-03-23 NOTE — Telephone Encounter (Signed)
LMOM with contact name and number for return call RE: results and further provider instructions; new Rx pending pharmacy verification/SLS

## 2013-03-27 MED ORDER — METFORMIN HCL 500 MG PO TABS: 500.0000 mg | ORAL_TABLET | Freq: Two times a day (BID) | ORAL | Status: DC

## 2013-03-27 NOTE — Telephone Encounter (Signed)
Pt informed and voiced understanding

## 2013-03-28 ENCOUNTER — Telehealth: Payer: Self-pay | Admitting: Internal Medicine

## 2013-03-28 NOTE — Telephone Encounter (Signed)
OK to have patient start checking sugars roughly once daily and as needed. Would have her check 1/2 the time prior to breakfast and 1/2 the time after her largest meal. Can rx generic glucometer (whatever her insurance will cover, she may need to check with pharmacist) and test strips and lancets. Have her come in with her numbers in 3-4 weeks and to call with any concerns. Want fasting numbers 80-130 and post prandial numbers 100 to 150

## 2013-03-28 NOTE — Telephone Encounter (Signed)
Patient is calling in regards to her new prescription for Metformin .  She was told to take Sig: Take 1 tablet (500 mg total) by mouth 2 (two) times daily with a meal.  She states does she have to take in morning /afternoon or evening or both at same times.  Reviewed instructions with patient on her medication.  She states she normally does not eat breakfast and usually has crackers.  Reviewed information on diabetic Diet and Carbohydrates. Discussed a Nutritional Consult. Caller believes she could benefit from the consult and would like the office to her help her with obtaining Nutritional consult.  She would also like to know should she be testing her blood sugar and how often and should she be given an Rx for supplies. Advised that I would forward questions, and concerns to office. PLEASE CONTACT PATIENT. [phone]

## 2013-03-28 NOTE — Telephone Encounter (Signed)
Spoke with pt & informed that Referral to Nutrition & Diabetic clinic ordered; Metformin should be taken two times daily w/meals [can take at lunch & dinner, if no breakfast eaten]; PCP out of office until Thurs, 04.16.14 RE: testing blood glucose. If provider answers note before, we will return call to patient with testing instructions & supplies needed if provider orders/SLS Patient informed, understood & agreed; Please advise as to if pt is to begin CBG testing & instructions/SLS

## 2013-03-29 NOTE — Telephone Encounter (Signed)
Left a message for patient to return my call. 

## 2013-03-30 MED ORDER — ONETOUCH ULTRA 2 W/DEVICE KIT: PACK | Status: DC

## 2013-03-30 NOTE — Telephone Encounter (Signed)
Patient informed, understood & agreed; she will call me directly back after speaking w/pharmacy to verify insurance coverage so that glucometer & supplies can be ordered/SLS Pharmacy refused to help pt with this matter d/t "not having time today"; informed pt will send OneTouch & if problem with Insurance, we will change on Mon, 04.21.14 [d/t 04.18.14 Holiday]/SLS

## 2013-04-04 ENCOUNTER — Ambulatory Visit: Payer: BC Managed Care – PPO

## 2013-04-21 ENCOUNTER — Ambulatory Visit (INDEPENDENT_AMBULATORY_CARE_PROVIDER_SITE_OTHER): Payer: BC Managed Care – PPO | Admitting: Family Medicine

## 2013-04-21 ENCOUNTER — Telehealth: Payer: Self-pay | Admitting: Family Medicine

## 2013-04-21 ENCOUNTER — Encounter: Payer: Self-pay | Admitting: Family Medicine

## 2013-04-21 VITALS — BP 118/68 | HR 93 | Temp 98.4°F | Ht 62.25 in | Wt 182.0 lb

## 2013-04-21 DIAGNOSIS — IMO0001 Reserved for inherently not codable concepts without codable children: Secondary | ICD-10-CM

## 2013-04-21 DIAGNOSIS — E039 Hypothyroidism, unspecified: Secondary | ICD-10-CM

## 2013-04-21 DIAGNOSIS — Z0000 Encounter for general adult medical examination without abnormal findings: Secondary | ICD-10-CM

## 2013-04-21 DIAGNOSIS — B354 Tinea corporis: Secondary | ICD-10-CM

## 2013-04-21 MED ORDER — FREESTYLE LANCETS MISC: Status: DC

## 2013-04-21 MED ORDER — KETOCONAZOLE 2 % EX SHAM: MEDICATED_SHAMPOO | CUTANEOUS | Status: DC

## 2013-04-21 MED ORDER — GLUCOSE BLOOD VI STRP: ORAL_STRIP | Status: DC

## 2013-04-21 MED ORDER — CLOTRIMAZOLE-BETAMETHASONE 1-0.05 % EX CREA: TOPICAL_CREAM | Freq: Every day | CUTANEOUS | Status: DC

## 2013-04-21 NOTE — Telephone Encounter (Signed)
Labs prior to next visit. Lipid, A1c, renal, cbc, tsh  Patient has appointment in July. She will be going to [facility] lab

## 2013-04-21 NOTE — Patient Instructions (Addendum)
MegaRed krill oil caps daily Avoid trans fats/partially oils in baked goods, peanut butter Probiotic such as Digestive Advantage daily   Diabetes and Exercise Regular exercise is important and can help:   Control blood glucose (sugar).  Decrease blood pressure.    Control blood lipids (cholesterol, triglycerides).  Improve overall health. BENEFITS FROM EXERCISE  Improved fitness.  Improved flexibility.  Improved endurance.  Increased bone density.  Weight control.  Increased muscle strength.  Decreased body fat.  Improvement of the body's use of insulin, a hormone.  Increased insulin sensitivity.  Reduction of insulin needs.  Reduced stress and tension.  Helps you feel better. People with diabetes who add exercise to their lifestyle gain additional benefits, including:  Weight loss.  Reduced appetite.  Improvement of the body's use of blood glucose.  Decreased risk factors for heart disease:  Lowering of cholesterol and triglycerides.  Raising the level of good cholesterol (high-density lipoproteins, HDL).  Lowering blood sugar.  Decreased blood pressure. TYPE 1 DIABETES AND EXERCISE  Exercise will usually lower your blood glucose.  If blood glucose is greater than 240 mg/dl, check urine ketones. If ketones are present, do not exercise.  Location of the insulin injection sites may need to be adjusted with exercise. Avoid injecting insulin into areas of the body that will be exercised. For example, avoid injecting insulin into:  The arms when playing tennis.  The legs when jogging. For more information, discuss this with your caregiver.  Keep a record of:  Food intake.  Type and amount of exercise.  Expected peak times of insulin action.  Blood glucose levels. Do this before, during, and after exercise. Review your records with your caregiver. This will help you to develop guidelines for adjusting food intake and insulin amounts.  TYPE 2  DIABETES AND EXERCISE  Regular physical activity can help control blood glucose.  Exercise is important because it may:  Increase the body's sensitivity to insulin.  Improve blood glucose control.  Exercise reduces the risk of heart disease. It decreases serum cholesterol and triglycerides. It also lowers blood pressure.  Those who take insulin or oral hypoglycemic agents should watch for signs of hypoglycemia. These signs include dizziness, shaking, sweating, chills, and confusion.  Body water is lost during exercise. It must be replaced. This will help to avoid loss of body fluids (dehydration) or heat stroke. Be sure to talk to your caregiver before starting an exercise program to make sure it is safe for you. Remember, any activity is better than none.  Document Released: 02/20/2004 Document Revised: 02/22/2012 Document Reviewed: 06/06/2009 [facility] Patient Information 2013 [place], [place].

## 2013-04-23 ENCOUNTER — Encounter: Payer: Self-pay | Admitting: Family Medicine

## 2013-04-23 NOTE — Assessment & Plan Note (Signed)
New onset, patient not happy with how we notified her, discussed at length and she is agreeable ot continue with plan, is going to see nutritionist. Avoid simple carbs, increase exercise. Tolerating Metformin bid, reassess in 8 weeks or as needed.

## 2013-04-23 NOTE — Progress Notes (Signed)
Patient ID: Tiffany Mckenzie, female   DOB: 1956-02-05, 57 y.o.   MRN: [id number] Tiffany Mckenzie [id number] 01/03/1956 04/23/2013      Progress Note-Follow Up  Subjective  Chief Complaint  Chief Complaint  Patient presents with  . Follow-up    diabetes    HPI  Patient is a 57 year old Caucasian female who has only recently been diagnosed with diabetes. Blood work at her last visit showed a hemoglobin A1c of 9.2. Since that visit we started our metformin and her glucometer. She expresses frustration with how she was notified about her diabetes. But acknowledges she is tolerating the metformin. No polyuria or polydipsia. No recent illness. No fevers or chills. No chest pain, palpitations, shortness of breath, GI or GU concerns. Did have one episode of feeling shaky since her last visit`  Past Medical History  Diagnosis Date  . Asthma   . Arthritis   . History of chicken pox   . Allergic rhinitis   . History of hepatitis     age 57  . Hypertension   . Hypothyroidism   . Hyperglycemia 09/13/2012  . Type II or unspecified type diabetes mellitus without mention of complication, uncontrolled 09/13/2012  . Overactive bladder 03/17/2013    Seeing urology    Past Surgical History  Procedure Laterality Date  . Tonsillectomy    . Abdominal hysterectomy  2002    Family History  Problem Relation Age of Onset  . Alcohol abuse Father   . Cancer Father     lung  . Diabetes Father   . Arthritis Mother   . Heart disease Mother     Blockage & double bypass  . Hypertension Mother   . Diabetes Mother   . Colon cancer Paternal Grandmother   . Stroke Paternal Grandmother     maternal grandfather  . Breast cancer Maternal Grandmother   . Heart disease Maternal Grandfather   . Scoliosis Daughter   . Cancer Paternal Grandfather     lung    History   Social History  . Marital Status: Married    Spouse Name: N/A    Number of Children: N/A  . Years of Education: N/A   Occupational History  .  Not on file.   Social History Main Topics  . Smoking status: Former Games developer  . Smokeless tobacco: Never Used  . Alcohol Use: No  . Drug Use: No  . Sexually Active: Not on file   Other Topics Concern  . Not on file   Social History Narrative  . No narrative on file    Current Outpatient Prescriptions on File Prior to Visit  Medication Sig Dispense Refill  . albuterol (VENTOLIN HFA) 108 (90 BASE) MCG/ACT inhaler Inhale 2 puffs into the lungs every 6 (six) hours as needed.      Marland Kitchen BENICAR 20 MG tablet TAKE 1 TABLET EVERY DAY  90 tablet  1  . Cyanocobalamin (VITAMIN B 12 PO) Take 1,000 mcg by mouth daily.      Marland Kitchen estradiol (ESTRACE) 1 MG tablet Take 1 mg by mouth daily.      . fluocinonide cream (LIDEX) 0.05 % Apply topically 2 (two) times daily.      Marland Kitchen levothyroxine (SYNTHROID, LEVOTHROID) 100 MCG tablet TAKE 1 TABLET EVERY DAY  90 tablet  1  . LORazepam (ATIVAN) 0.5 MG tablet Take 0.5 mg by mouth 2 (two) times daily as needed.      . metFORMIN (GLUCOPHAGE) 500 MG tablet Take 1 tablet (500 mg  total) by mouth 2 (two) times daily with a meal.  60 tablet  3   No current facility-administered medications on file prior to visit.    Allergies  Allergen Reactions  . Clarithromycin     Rash  . Codeine     vomiting    Review of Systems  Review of Systems  Constitutional: Negative for fever and malaise/fatigue.  HENT: Negative for congestion.   Eyes: Negative for discharge.  Respiratory: Negative for shortness of breath.   Cardiovascular: Negative for chest pain, palpitations and leg swelling.  Gastrointestinal: Negative for nausea, abdominal pain and diarrhea.  Genitourinary: Negative for dysuria.  Musculoskeletal: Negative for falls.  Skin: Negative for rash.  Neurological: Negative for loss of consciousness and headaches.  Endo/Heme/Allergies: Negative for polydipsia.  Psychiatric/Behavioral: Negative for depression and suicidal ideas. The patient is not nervous/anxious and  does not have insomnia.     Objective  BP 118/68  Pulse 93  Temp(Src) 98.4 F (36.9 C) (Oral)  Ht 5' 2.25" (1.581 m)  Wt 182 lb 0.6 oz (82.573 kg)  BMI 33.04 kg/m2  SpO2 96%  Physical Exam  Physical Exam  Constitutional: She is oriented to person, place, and time and well-developed, well-nourished, and in no distress. No distress.  HENT:  Head: Normocephalic and atraumatic.  Eyes: Conjunctivae are normal.  Neck: Neck supple. No thyromegaly present.  Cardiovascular: Normal rate, regular rhythm and normal heart sounds.   No murmur heard. Pulmonary/Chest: Effort normal and breath sounds normal. She has no wheezes.  Abdominal: She exhibits no distension and no mass.  Musculoskeletal: She exhibits no edema.  Lymphadenopathy:    She has no cervical adenopathy.  Neurological: She is alert and oriented to person, place, and time.  Skin: Skin is warm and dry. No rash noted. She is not diaphoretic.  Psychiatric: Memory, affect and judgment normal.    Lab Results  Component Value Date   TSH 2.403 03/16/2013   Lab Results  Component Value Date   WBC 7.6 03/16/2013   HGB 14.0 03/16/2013   HCT 40.4 03/16/2013   MCV 86.0 03/16/2013   PLT 247 03/16/2013   Lab Results  Component Value Date   CREATININE 0.68 03/16/2013   BUN 14 03/16/2013   NA 139 03/16/2013   K 4.2 03/16/2013   CL 100 03/16/2013   CO2 32 03/16/2013   Lab Results  Component Value Date   ALT 31 03/16/2013   AST 18 03/16/2013   ALKPHOS 138* 03/16/2013   BILITOT 0.5 03/16/2013   Lab Results  Component Value Date   CHOL 189 03/16/2013   Lab Results  Component Value Date   HDL 39* 03/16/2013   Lab Results  Component Value Date   LDLCALC 125* 03/16/2013   Lab Results  Component Value Date   TRIG 126 03/16/2013   Lab Results  Component Value Date   CHOLHDL 4.8 03/16/2013     Assessment & Plan  Type II or unspecified type diabetes mellitus without mention of complication, uncontrolled New onset, patient not happy with how we  notified her, discussed at length and she is agreeable ot continue with plan, is going to see nutritionist. Avoid simple carbs, increase exercise. Tolerating Metformin bid, reassess in 8 weeks or as needed.  Hypothyroidism Well treated on current dose of Levothyroxine.

## 2013-04-23 NOTE — Assessment & Plan Note (Signed)
>>  ASSESSMENT AND PLAN FOR TYPE 2 DIABETES MELLITUS WITH OBESITY WRITTEN ON 04/23/2013  5:24 PM BY BLYTH, STACEY A, MD  New onset, patient not happy with how we notified her, discussed at length and she is agreeable ot continue with plan, is going to see nutritionist. Avoid simple carbs, increase exercise. Tolerating Metformin  bid, reassess in 8 weeks or as needed.

## 2013-04-23 NOTE — Assessment & Plan Note (Signed)
Well treated on current dose of Levothyroxine 

## 2013-04-24 ENCOUNTER — Encounter: Payer: BC Managed Care – PPO | Attending: Family Medicine | Admitting: *Deleted

## 2013-04-24 ENCOUNTER — Encounter: Payer: Self-pay | Admitting: *Deleted

## 2013-04-24 VITALS — Ht 62.0 in | Wt 183.1 lb

## 2013-04-24 DIAGNOSIS — Z713 Dietary counseling and surveillance: Secondary | ICD-10-CM | POA: Insufficient documentation

## 2013-04-24 DIAGNOSIS — E119 Type 2 diabetes mellitus without complications: Secondary | ICD-10-CM | POA: Insufficient documentation

## 2013-04-24 NOTE — Progress Notes (Signed)
  Medical Nutrition Therapy:  Appt start time: 1630 end time:  1730.  Assessment:  Primary concerns today: new diagnosis of diabetes. Lives with husband. She shops and prepares the meals. Works as Airline pilot at radio station. 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 Enjoys being active and traveling as able. She reports she SMBG twice a day for the past month with range being 130 - 180 including pre and post meals.  MEDICATIONS: see list, Metformin is Diabetes medication   DIETARY INTAKE:  Usual eating pattern includes 2-3 meals and 0-1 snacks per day.  Everyday foods include good variety of all food groups.  Avoided foods include higher calorie foods.    24-hr recall:  B ( AM): skips less often lately. Now might have yogurt with Special K Bar, banana OR Kellogg's To Go Drink  Snk ( AM): not usually, spreads breakfast foods out throughout the morning  L ( PM): bringing more often now with salad OR ham sandwich and chips, unsweet tea or diet soda now. Snk ( PM): not now, used to have al candy bar D ( PM): grilled cheese or hamburger if at home, eat out at steakhouse with meat, starch and vegetable type meal Snk ( PM): not as often now, maybe some cheese OR 1/2 of a  Kellogg's drink Beverages: coffee with sweetener and a creamer, unsweet tea OR diet soda OR water  Usual physical activity: not in the past, starting to walk some now. Does have back and hip problems  Estimated energy needs: 1400 calories 158 g carbohydrates 105 g protein 39 g fat  Progress Towards Goal(s):  In progress.   Nutritional Diagnosis:  NB-1.1 Food and nutrition-related knowledge deficit As related to diabetes management.  As evidenced by A1c of 9.2% on 03/16/2013    Intervention:  Nutrition counseling and diabetes education initiated. Discussed basic physiology of diabetes, SMBG and rationale of checking BG at alternate times of day, A1c, Carb Counting and reading food labels, and benefits of  increased activity.  . Plan:  Aim for 3 Carb Choices per meal (45 grams) +/- 1 either way  Aim for 0-1 Carbs per snack if hungry  Consider reading food labels for Total Carbohydrate of foods Consider options for increasing your activity level as tolerated Contininue checking BG at alternate times per day as directed by MD  Handouts given during visit include: Living Well with Diabetes Carb Counting and Food Label handouts Meal Plan Card  Monitoring/Evaluation:  Dietary intake, exercise, reading food lables, and body weight prn.

## 2013-04-24 NOTE — Patient Instructions (Signed)
Plan:  Aim for 3 Carb Choices per meal (45 grams) +/- 1 either way  Aim for 0-1 Carbs per snack if hungry  Consider reading food labels for Total Carbohydrate of foods Consider options for increasing your activity level as tolerated Contininue checking BG at alternate times per day as directed by MD

## 2013-05-13 ENCOUNTER — Other Ambulatory Visit: Payer: Self-pay | Admitting: Internal Medicine

## 2013-05-15 ENCOUNTER — Other Ambulatory Visit: Payer: Self-pay | Admitting: Internal Medicine

## 2013-06-02 ENCOUNTER — Telehealth: Payer: Self-pay | Admitting: Family Medicine

## 2013-06-02 MED ORDER — OLMESARTAN MEDOXOMIL 20 MG PO TABS: 20.0000 mg | ORAL_TABLET | Freq: Every day | ORAL | Status: DC

## 2013-06-02 NOTE — Telephone Encounter (Signed)
benicar 20 mg tablet take 1 tablet every day last fill 05-13-2013 qty 90

## 2013-06-22 ENCOUNTER — Ambulatory Visit: Payer: BC Managed Care – PPO | Admitting: Family Medicine

## 2013-06-27 ENCOUNTER — Ambulatory Visit: Payer: BC Managed Care – PPO | Admitting: Family Medicine

## 2013-06-28 ENCOUNTER — Ambulatory Visit: Payer: BC Managed Care – PPO | Admitting: Family Medicine

## 2013-06-29 ENCOUNTER — Ambulatory Visit: Payer: BC Managed Care – PPO | Admitting: Family Medicine

## 2013-07-04 ENCOUNTER — Ambulatory Visit (INDEPENDENT_AMBULATORY_CARE_PROVIDER_SITE_OTHER): Payer: BC Managed Care – PPO | Admitting: Family Medicine

## 2013-07-04 ENCOUNTER — Encounter: Payer: Self-pay | Admitting: Family Medicine

## 2013-07-04 VITALS — BP 98/70 | HR 82 | Temp 98.3°F | Ht 62.25 in | Wt 175.0 lb

## 2013-07-04 DIAGNOSIS — I10 Essential (primary) hypertension: Secondary | ICD-10-CM

## 2013-07-04 DIAGNOSIS — E119 Type 2 diabetes mellitus without complications: Secondary | ICD-10-CM

## 2013-07-04 DIAGNOSIS — E039 Hypothyroidism, unspecified: Secondary | ICD-10-CM

## 2013-07-05 LAB — HEPATIC FUNCTION PANEL
ALT: 19 U/L (ref 0–35)
AST: 14 U/L (ref 0–37)
Alkaline Phosphatase: 85 U/L (ref 39–117)
Bilirubin, Direct: 0.1 mg/dL (ref 0.0–0.3)
Indirect Bilirubin: 0.4 mg/dL (ref 0.0–0.9)

## 2013-07-05 LAB — RENAL FUNCTION PANEL
Albumin: 4.1 g/dL (ref 3.5–5.2)
BUN: 14 mg/dL (ref 6–23)
Calcium: 9.6 mg/dL (ref 8.4–10.5)
Creat: 0.68 mg/dL (ref 0.50–1.10)
Glucose, Bld: 100 mg/dL — ABNORMAL HIGH (ref 70–99)
Sodium: 141 mEq/L (ref 135–145)

## 2013-07-05 LAB — CBC
MCH: 31.2 pg (ref 26.0–34.0)
MCHC: 35 g/dL (ref 30.0–36.0)
MCV: 89.2 fL (ref 78.0–100.0)
Platelets: 235 10*3/uL (ref 150–400)
RDW: 12.9 % (ref 11.5–15.5)

## 2013-07-05 LAB — LIPID PANEL: LDL Cholesterol: 78 mg/dL (ref 0–99)

## 2013-07-05 MED ORDER — METFORMIN HCL ER 500 MG PO TB24: 500.0000 mg | ORAL_TABLET | Freq: Two times a day (BID) | ORAL | Status: DC

## 2013-07-09 NOTE — Assessment & Plan Note (Signed)
>>  ASSESSMENT AND PLAN FOR TYPE 2 DIABETES MELLITUS WITH OBESITY WRITTEN ON 07/09/2013  6:27 PM BY BLYTH, STACEY A, MD  Great improvement in hgba1c, minimize simple carbs and continue current meds.

## 2013-07-09 NOTE — Assessment & Plan Note (Signed)
Continue current dose of Levothyroxine for now. T4 wnl

## 2013-07-09 NOTE — Assessment & Plan Note (Signed)
May use Albuterol prn

## 2013-07-09 NOTE — Assessment & Plan Note (Signed)
Great improvement in hgba1c, minimize simple carbs and continue current meds.

## 2013-07-09 NOTE — Progress Notes (Signed)
Patient ID: Tiffany Mckenzie, female   DOB: 19-Jun-1956, 57 y.o.   MRN: [id number] Ailynn Gow [id number] July 12, 1956 07/09/2013      Progress Note-Follow Up  Subjective  Chief Complaint  Chief Complaint  Patient presents with  . Follow-up    HPI  Patient is a 57 year old Caucasian female in today for followup on multiple medical conditions. Her blood sugars are improved ranging between 110 and 175. No polyuria or polydipsia. No recent illness. No headaches, chest pain, palpitations, shortness or breath, GI or GU concerns. Is taking medications as prescribed.  Past Medical History  Diagnosis Date  . Asthma   . Arthritis   . History of chicken pox   . Allergic rhinitis   . History of hepatitis     age 31  . Hypertension   . Hypothyroidism   . Hyperglycemia 09/13/2012  . Type II or unspecified type diabetes mellitus without mention of complication, uncontrolled 09/13/2012  . Overactive bladder 03/17/2013    Seeing urology    Past Surgical History  Procedure Laterality Date  . Tonsillectomy    . Abdominal hysterectomy  2002    Family History  Problem Relation Age of Onset  . Alcohol abuse Father   . Cancer Father     lung  . Diabetes Father   . Arthritis Mother   . Heart disease Mother     Blockage & double bypass  . Hypertension Mother   . Diabetes Mother   . Colon cancer Paternal Grandmother   . Stroke Paternal Grandmother     maternal grandfather  . Breast cancer Maternal Grandmother   . Heart disease Maternal Grandfather   . Scoliosis Daughter   . Cancer Paternal Grandfather     lung    History   Social History  . Marital Status: Married    Spouse Name: N/A    Number of Children: N/A  . Years of Education: N/A   Occupational History  . Not on file.   Social History Main Topics  . Smoking status: Never Smoker   . Smokeless tobacco: Never Used  . Alcohol Use: Yes     Comment: less than once a month, white wine or mixed drink  . Drug Use: No  . Sexually  Active: Not on file   Other Topics Concern  . Not on file   Social History Narrative  . No narrative on file    Current Outpatient Prescriptions on File Prior to Visit  Medication Sig Dispense Refill  . albuterol (VENTOLIN HFA) 108 (90 BASE) MCG/ACT inhaler Inhale 2 puffs into the lungs every 6 (six) hours as needed.      . clotrimazole-betamethasone (LOTRISONE) cream Apply topically at bedtime.  45 g  1  . Cyanocobalamin (VITAMIN B 12 PO) Take 1,000 mcg by mouth daily.      Marland Kitchen estradiol (ESTRACE) 1 MG tablet Take 1 mg by mouth daily.      . fluocinonide cream (LIDEX) 0.05 % Apply topically 2 (two) times daily.      Marland Kitchen glucose blood (FREESTYLE LITE) test strip Use as instructed  100 each  1  . ketoconazole (NIZORAL) 2 % shampoo Apply topically 2 (two) times a week. Wash, leave 5 minutes and wash off  120 mL  0  . Lancets (FREESTYLE) lancets Use as instructed  100 each  1  . LORazepam (ATIVAN) 0.5 MG tablet Take 0.5 mg by mouth 2 (two) times daily as needed.       No current  facility-administered medications on file prior to visit.    Allergies  Allergen Reactions  . Clarithromycin     Rash  . Codeine     vomiting    Review of Systems  Review of Systems  Constitutional: Negative for fever and malaise/fatigue.  HENT: Negative for congestion.   Eyes: Negative for discharge.  Respiratory: Negative for shortness of breath.   Cardiovascular: Negative for chest pain, palpitations and leg swelling.  Gastrointestinal: Negative for nausea, abdominal pain and diarrhea.  Genitourinary: Negative for dysuria.  Musculoskeletal: Negative for falls.  Skin: Negative for rash.  Neurological: Negative for loss of consciousness and headaches.  Endo/Heme/Allergies: Negative for polydipsia.  Psychiatric/Behavioral: Negative for depression and suicidal ideas. The patient is not nervous/anxious and does not have insomnia.     Objective  BP 98/70  Pulse 82  Temp(Src) 98.3 F (36.8 C) (Oral)   Ht 5' 2.25" (1.581 m)  Wt 175 lb (79.379 kg)  BMI 31.76 kg/m2  SpO2 96%  Physical Exam  Physical Exam  Constitutional: She is oriented to person, place, and time and well-developed, well-nourished, and in no distress. No distress.  HENT:  Head: Normocephalic and atraumatic.  Eyes: Conjunctivae are normal.  Neck: Neck supple. No thyromegaly present.  Cardiovascular: Normal rate, regular rhythm and normal heart sounds.   No murmur heard. Pulmonary/Chest: Effort normal and breath sounds normal. She has no wheezes.  Abdominal: She exhibits no distension and no mass.  Musculoskeletal: She exhibits no edema.  Lymphadenopathy:    She has no cervical adenopathy.  Neurological: She is alert and oriented to person, place, and time.  Skin: Skin is warm and dry. No rash noted. She is not diaphoretic.  Psychiatric: Memory, affect and judgment normal.    Lab Results  Component Value Date   TSH 0.343* 07/04/2013   Lab Results  Component Value Date   WBC 7.2 07/04/2013   HGB 13.6 07/04/2013   HCT 38.9 07/04/2013   MCV 89.2 07/04/2013   PLT 235 07/04/2013   Lab Results  Component Value Date   CREATININE 0.68 07/04/2013   BUN 14 07/04/2013   NA 141 07/04/2013   K 4.2 07/04/2013   CL 103 07/04/2013   CO2 30 07/04/2013   Lab Results  Component Value Date   ALT 19 07/04/2013   AST 14 07/04/2013   ALKPHOS 85 07/04/2013   BILITOT 0.5 07/04/2013   Lab Results  Component Value Date   CHOL 138 07/04/2013   Lab Results  Component Value Date   HDL 37* 07/04/2013   Lab Results  Component Value Date   LDLCALC 78 07/04/2013   Lab Results  Component Value Date   TRIG 114 07/04/2013   Lab Results  Component Value Date   CHOLHDL 3.7 07/04/2013     Assessment & Plan  Type II or unspecified type diabetes mellitus without mention of complication, uncontrolled Great improvement in hgba1c, minimize simple carbs and continue current meds.  Hypothyroidism Continue current dose of Levothyroxine  for now. T4 wnl  Asthma May use Albuterol prn

## 2013-08-28 ENCOUNTER — Other Ambulatory Visit: Payer: Self-pay | Admitting: Family Medicine

## 2013-09-20 ENCOUNTER — Encounter: Payer: BC Managed Care – PPO | Admitting: Family Medicine

## 2013-09-25 ENCOUNTER — Encounter: Payer: BC Managed Care – PPO | Admitting: Family Medicine

## 2013-10-19 ENCOUNTER — Other Ambulatory Visit: Payer: Self-pay

## 2013-11-06 ENCOUNTER — Ambulatory Visit (INDEPENDENT_AMBULATORY_CARE_PROVIDER_SITE_OTHER): Payer: BC Managed Care – PPO | Admitting: Family Medicine

## 2013-11-06 ENCOUNTER — Encounter: Payer: Self-pay | Admitting: Family Medicine

## 2013-11-06 ENCOUNTER — Telehealth: Payer: Self-pay | Admitting: Family Medicine

## 2013-11-06 VITALS — BP 102/68 | HR 67 | Temp 98.4°F | Ht 62.25 in | Wt 167.0 lb

## 2013-11-06 DIAGNOSIS — IMO0001 Reserved for inherently not codable concepts without codable children: Secondary | ICD-10-CM

## 2013-11-06 DIAGNOSIS — E039 Hypothyroidism, unspecified: Secondary | ICD-10-CM

## 2013-11-06 DIAGNOSIS — Z0000 Encounter for general adult medical examination without abnormal findings: Secondary | ICD-10-CM

## 2013-11-06 DIAGNOSIS — E538 Deficiency of other specified B group vitamins: Secondary | ICD-10-CM

## 2013-11-06 DIAGNOSIS — Z23 Encounter for immunization: Secondary | ICD-10-CM

## 2013-11-06 DIAGNOSIS — I10 Essential (primary) hypertension: Secondary | ICD-10-CM

## 2013-11-06 DIAGNOSIS — J45909 Unspecified asthma, uncomplicated: Secondary | ICD-10-CM

## 2013-11-06 MED ORDER — OLMESARTAN 10 MG HALF TABLET: ORAL_TABLET | ORAL | Status: DC

## 2013-11-06 NOTE — Telephone Encounter (Signed)
Lab orders needed for 12/14/2013 Labs prior lipid, renal, cbc, tsh, hepatic, hgba1c,

## 2013-11-06 NOTE — Progress Notes (Signed)
Pre visit review using our clinic review tool, if applicable. No additional management support is needed unless otherwise documented below in the visit note. 

## 2013-11-06 NOTE — Patient Instructions (Signed)

## 2013-11-07 NOTE — Telephone Encounter (Signed)
Lab order placed.

## 2013-11-11 NOTE — Assessment & Plan Note (Signed)
Pneumonia shot and flu shot today. Agrees to Tdap at next visit.

## 2013-11-11 NOTE — Assessment & Plan Note (Signed)
No recent flares 

## 2013-11-11 NOTE — Assessment & Plan Note (Signed)
>>  ASSESSMENT AND PLAN FOR TYPE 2 DIABETES MELLITUS WITH OBESITY WRITTEN ON 11/11/2013  6:03 PM BY BLYTH, STACEY A, MD  Patient notes improved control, no changes. Will continue current meds and check hgba1c at next visit.

## 2013-11-11 NOTE — Progress Notes (Signed)
Patient ID: Tiffany Mckenzie, female   DOB: 19-Dec-1955, 57 y.o.   MRN: [id number] Tiffany Mckenzie [id number] [birth year] 11/11/2013      Progress Note-Follow Up  Subjective  Chief Complaint  Chief Complaint  Patient presents with  . Follow-up    4 month  . Injections    flu and Prevnar    HPI  Patient is a 57 year old Caucasian in today for followup patient reports feeling well. Her sugars have been running well. No polyuria no polydipsia. Trying to eat hives. No chest pain no palpitations, shortness of breath, GI or GU complaints. He is taking medications as prescribed  Past Medical History  Diagnosis Date  . Asthma   . Arthritis   . History of chicken pox   . Allergic rhinitis   . History of hepatitis     age 57  . Hypertension   . Hypothyroidism   . Hyperglycemia 09/13/2012  . Type II or unspecified type diabetes mellitus without mention of complication, uncontrolled 09/13/2012  . Overactive bladder 03/17/2013    Seeing urology    Past Surgical History  Procedure Laterality Date  . Tonsillectomy    . Abdominal hysterectomy  2002    Family History  Problem Relation Age of Onset  . Alcohol abuse Father   . Cancer Father     lung  . Diabetes Father   . Arthritis Mother   . Heart disease Mother     Blockage & double bypass  . Hypertension Mother   . Diabetes Mother   . Colon cancer Paternal Grandmother   . Stroke Paternal Grandmother     maternal grandfather  . Breast cancer Maternal Grandmother   . Heart disease Maternal Grandfather   . Scoliosis Daughter   . Cancer Paternal Grandfather     lung    History   Social History  . Marital Status: Married    Spouse Name: N/A    Number of Children: N/A  . Years of Education: N/A   Occupational History  . Not on file.   Social History Main Topics  . Smoking status: Never Smoker   . Smokeless tobacco: Never Used  . Alcohol Use: Yes     Comment: less than once a month, white wine or mixed drink  . Drug Use: No  .  Sexual Activity: Not on file   Other Topics Concern  . Not on file   Social History Narrative  . No narrative on file    Current Outpatient Prescriptions on File Prior to Visit  Medication Sig Dispense Refill  . albuterol (VENTOLIN HFA) 108 (90 BASE) MCG/ACT inhaler Inhale 2 puffs into the lungs every 6 (six) hours as needed.      . Blood Glucose Monitoring Suppl (FREESTYLE LITE) DEVI       . clotrimazole-betamethasone (LOTRISONE) cream Apply topically at bedtime.  45 g  1  . Cyanocobalamin (VITAMIN B 12 PO) Take 1,000 mcg by mouth daily.      Marland Kitchen estradiol (ESTRACE) 1 MG tablet Take 1 mg by mouth daily.      . fluocinonide cream (LIDEX) 0.05 % Apply topically 2 (two) times daily.      Marland Kitchen glucose blood (FREESTYLE LITE) test strip Use as instructed  100 each  1  . ketoconazole (NIZORAL) 2 % shampoo Apply topically 2 (two) times a week. Wash, leave 5 minutes and wash off  120 mL  0  . Lancets (FREESTYLE) lancets Use as instructed  100 each  1  .  levothyroxine (SYNTHROID, LEVOTHROID) 100 MCG tablet Take 100 mcg by mouth as directed. Take 1 tab 6 days a week      . LORazepam (ATIVAN) 0.5 MG tablet Take 0.5 mg by mouth 2 (two) times daily as needed.      . metFORMIN (GLUCOPHAGE) 500 MG tablet TAKE 1 TABLET BY MOUTH TWO TIMES DAILY WITH A MEAL  60 tablet  3  . PROTOPIC 0.1 % ointment        No current facility-administered medications on file prior to visit.    Allergies  Allergen Reactions  . Clarithromycin     Rash  . Codeine     vomiting    Review of Systems  Review of Systems  Constitutional: Negative for fever and malaise/fatigue.  HENT: Negative for congestion.   Eyes: Negative for discharge.  Respiratory: Negative for shortness of breath.   Cardiovascular: Negative for chest pain, palpitations and leg swelling.  Gastrointestinal: Negative for nausea, abdominal pain and diarrhea.  Genitourinary: Negative for dysuria.  Musculoskeletal: Negative for falls.  Skin: Negative  for rash.  Neurological: Negative for loss of consciousness and headaches.  Endo/Heme/Allergies: Negative for polydipsia.  Psychiatric/Behavioral: Negative for depression and suicidal ideas. The patient is not nervous/anxious and does not have insomnia.     Objective  BP 102/68  Pulse 67  Temp(Src) 98.4 F (36.9 C) (Oral)  Ht 5' 2.25" (1.581 m)  Wt 167 lb (75.751 kg)  BMI 30.31 kg/m2  SpO2 98%  Physical Exam  Physical Exam  Constitutional: She is oriented to person, place, and time and well-developed, well-nourished, and in no distress. No distress.  HENT:  Head: Normocephalic and atraumatic.  Eyes: Conjunctivae are normal.  Neck: Neck supple. No thyromegaly present.  Cardiovascular: Normal rate, regular rhythm and normal heart sounds.   No murmur heard. Pulmonary/Chest: Effort normal and breath sounds normal. She has no wheezes.  Abdominal: She exhibits no distension and no mass.  Musculoskeletal: She exhibits no edema.  Lymphadenopathy:    She has no cervical adenopathy.  Neurological: She is alert and oriented to person, place, and time.  Skin: Skin is warm and dry. No rash noted. She is not diaphoretic.  Psychiatric: Memory, affect and judgment normal.    Lab Results  Component Value Date   TSH 0.343* 07/04/2013   Lab Results  Component Value Date   WBC 7.2 07/04/2013   HGB 13.6 07/04/2013   HCT 38.9 07/04/2013   MCV 89.2 07/04/2013   PLT 235 07/04/2013   Lab Results  Component Value Date   CREATININE 0.68 07/04/2013   BUN 14 07/04/2013   NA 141 07/04/2013   K 4.2 07/04/2013   CL 103 07/04/2013   CO2 30 07/04/2013   Lab Results  Component Value Date   ALT 19 07/04/2013   AST 14 07/04/2013   ALKPHOS 85 07/04/2013   BILITOT 0.5 07/04/2013   Lab Results  Component Value Date   CHOL 138 07/04/2013   Lab Results  Component Value Date   HDL 37* 07/04/2013   Lab Results  Component Value Date   LDLCALC 78 07/04/2013   Lab Results  Component Value Date   TRIG  114 07/04/2013   Lab Results  Component Value Date   CHOLHDL 3.7 07/04/2013     Assessment & Plan  Routine general medical examination at a health care facility Pneumonia shot and flu shot today. Agrees to Tdap at next visit.  Asthma No recent flares.   Type II or  unspecified type diabetes mellitus without mention of complication, uncontrolled Patient notes improved control, no changes. Will continue current meds and check hgba1c at next visit.

## 2013-11-11 NOTE — Assessment & Plan Note (Signed)
Patient notes improved control, no changes. Will continue current meds and check hgba1c at next visit.

## 2013-11-12 ENCOUNTER — Other Ambulatory Visit: Payer: Self-pay | Admitting: Family Medicine

## 2013-12-23 ENCOUNTER — Other Ambulatory Visit: Payer: Self-pay | Admitting: Family Medicine

## 2014-04-23 ENCOUNTER — Telehealth: Payer: Self-pay | Admitting: Family Medicine

## 2014-04-23 MED ORDER — GLUCOSE BLOOD VI STRP: ORAL_STRIP | Status: DC

## 2014-04-23 NOTE — Telephone Encounter (Signed)
Please call and schedule an appt. Pt was supposed to return around the end of January.

## 2014-04-23 NOTE — Telephone Encounter (Signed)
Refill-freestyle lite test strip  [id number] [address] [place]

## 2014-04-24 MED ORDER — METFORMIN HCL 500 MG PO TABS: 500.0000 mg | ORAL_TABLET | Freq: Two times a day (BID) | ORAL | Status: DC

## 2014-04-24 NOTE — Telephone Encounter (Signed)
[facility]- metformin  [id number]  [address] [place].  Please send back to me so I can call patient. Thanks!

## 2014-04-24 NOTE — Telephone Encounter (Signed)
Informed patient of medication refill and she scheduled appointment for 05/14/14

## 2014-05-07 ENCOUNTER — Other Ambulatory Visit: Payer: Self-pay | Admitting: Family Medicine

## 2014-05-08 ENCOUNTER — Telehealth: Payer: Self-pay | Admitting: Family Medicine

## 2014-05-08 NOTE — Telephone Encounter (Signed)
Ref[facility]-metformin  [facility] [id number] in [place]

## 2014-05-11 ENCOUNTER — Ambulatory Visit: Payer: BC Managed Care – PPO | Admitting: Family Medicine

## 2014-05-14 ENCOUNTER — Ambulatory Visit (INDEPENDENT_AMBULATORY_CARE_PROVIDER_SITE_OTHER): Payer: BC Managed Care – PPO | Admitting: Family Medicine

## 2014-05-14 ENCOUNTER — Encounter: Payer: Self-pay | Admitting: Family Medicine

## 2014-05-14 ENCOUNTER — Telehealth: Payer: Self-pay | Admitting: Family Medicine

## 2014-05-14 VITALS — BP 102/62 | HR 91 | Temp 98.2°F | Ht 62.25 in | Wt 167.1 lb

## 2014-05-14 DIAGNOSIS — N3281 Overactive bladder: Secondary | ICD-10-CM

## 2014-05-14 DIAGNOSIS — E1165 Type 2 diabetes mellitus with hyperglycemia: Secondary | ICD-10-CM

## 2014-05-14 DIAGNOSIS — R946 Abnormal results of thyroid function studies: Secondary | ICD-10-CM

## 2014-05-14 DIAGNOSIS — IMO0001 Reserved for inherently not codable concepts without codable children: Secondary | ICD-10-CM

## 2014-05-14 DIAGNOSIS — E119 Type 2 diabetes mellitus without complications: Secondary | ICD-10-CM

## 2014-05-14 DIAGNOSIS — I10 Essential (primary) hypertension: Secondary | ICD-10-CM

## 2014-05-14 DIAGNOSIS — E538 Deficiency of other specified B group vitamins: Secondary | ICD-10-CM

## 2014-05-14 DIAGNOSIS — R350 Frequency of micturition: Secondary | ICD-10-CM

## 2014-05-14 DIAGNOSIS — N318 Other neuromuscular dysfunction of bladder: Secondary | ICD-10-CM

## 2014-05-14 DIAGNOSIS — E039 Hypothyroidism, unspecified: Secondary | ICD-10-CM

## 2014-05-14 DIAGNOSIS — E079 Disorder of thyroid, unspecified: Secondary | ICD-10-CM

## 2014-05-14 LAB — RENAL FUNCTION PANEL
Albumin: 4.3 g/dL (ref 3.5–5.2)
BUN: 17 mg/dL (ref 6–23)
CALCIUM: 9.9 mg/dL (ref 8.4–10.5)
CO2: 25 mEq/L (ref 19–32)
Chloride: 102 mEq/L (ref 96–112)
Creat: 0.68 mg/dL (ref 0.50–1.10)
Glucose, Bld: 101 mg/dL — ABNORMAL HIGH (ref 70–99)
PHOSPHORUS: 3.2 mg/dL (ref 2.3–4.6)
POTASSIUM: 4.1 meq/L (ref 3.5–5.3)
SODIUM: 138 meq/L (ref 135–145)

## 2014-05-14 LAB — TSH: TSH: 0.51 u[IU]/mL (ref 0.350–4.500)

## 2014-05-14 LAB — HEMOGLOBIN A1C
Hgb A1c MFr Bld: 6.2 % — ABNORMAL HIGH (ref <5.7)
MEAN PLASMA GLUCOSE: 131 mg/dL — AB (ref <117)

## 2014-05-14 LAB — HEPATIC FUNCTION PANEL
ALBUMIN: 4.3 g/dL (ref 3.5–5.2)
ALT: 12 U/L (ref 0–35)
AST: 12 U/L (ref 0–37)
Alkaline Phosphatase: 83 U/L (ref 39–117)
Bilirubin, Direct: 0.1 mg/dL (ref 0.0–0.3)
Indirect Bilirubin: 0.6 mg/dL (ref 0.2–1.2)
Total Bilirubin: 0.7 mg/dL (ref 0.2–1.2)
Total Protein: 6.5 g/dL (ref 6.0–8.3)

## 2014-05-14 LAB — LIPID PANEL
CHOL/HDL RATIO: 3.5 ratio
CHOLESTEROL: 166 mg/dL (ref 0–200)
HDL: 48 mg/dL (ref >39)
LDL Cholesterol: 101 mg/dL — ABNORMAL HIGH (ref 0–99)
TRIGLYCERIDES: 86 mg/dL (ref <150)
VLDL: 17 mg/dL (ref 0–40)

## 2014-05-14 LAB — T4, FREE: FREE T4: 1.54 ng/dL (ref 0.80–1.80)

## 2014-05-14 LAB — VITAMIN B12: VITAMIN B 12: 192 pg/mL — AB (ref 211–911)

## 2014-05-14 NOTE — Patient Instructions (Signed)

## 2014-05-14 NOTE — Progress Notes (Signed)
Pre visit review using our clinic review tool, if applicable. No additional management support is needed unless otherwise documented below in the visit note. 

## 2014-05-14 NOTE — Telephone Encounter (Signed)
Relevant patient education assigned to patient using Emmi. ° °

## 2014-05-14 NOTE — Progress Notes (Signed)
Patient ID: Tiffany Mckenzie, female   DOB: 25-Jul-1956, 58 y.o.   MRN: [id number] Tiffany Mckenzie [id number] March 27, 1956 05/14/2014      Progress Note-Follow Up  Subjective  Chief Complaint  Chief Complaint  Patient presents with  . Medication Refill    HPI  Patient is a 58 year old female in today for routine medical care. Doing fairly well. Reports blood sugars ranging from 76-147 typical numbers in the 120s 130s. Denies any polyuria or polydipsia. Does complain of some trouble: Feet and aching in her feet recently. No rash. No injuries. Does have a long history of mild chronic low back pain but this has not recently worsened. There's been no trauma or injury. Declines Tdap today. Denies CP/palp/SOB/HA/congestion/fevers/GI or GU c/o. Taking meds as prescribed  Past Medical History  Diagnosis Date  . Asthma   . Arthritis   . History of chicken pox   . Allergic rhinitis   . History of hepatitis     age 46  . Hypertension   . Hypothyroidism   . Hyperglycemia 09/13/2012  . Type II or unspecified type diabetes mellitus without mention of complication, uncontrolled 09/13/2012  . Overactive bladder 03/17/2013    Seeing urology    Past Surgical History  Procedure Laterality Date  . Tonsillectomy    . Abdominal hysterectomy  2002    Family History  Problem Relation Age of Onset  . Alcohol abuse Father   . Cancer Father     lung  . Diabetes Father   . Arthritis Mother   . Heart disease Mother     Blockage & double bypass  . Hypertension Mother   . Diabetes Mother   . Colon cancer Paternal Grandmother   . Stroke Paternal Grandmother     maternal grandfather  . Breast cancer Maternal Grandmother   . Heart disease Maternal Grandfather   . Scoliosis Daughter   . Cancer Paternal Grandfather     lung    History   Social History  . Marital Status: Married    Spouse Name: N/A    Number of Children: N/A  . Years of Education: N/A   Occupational History  . Not on file.   Social  History Main Topics  . Smoking status: Never Smoker   . Smokeless tobacco: Never Used  . Alcohol Use: Yes     Comment: less than once a month, white wine or mixed drink  . Drug Use: No  . Sexual Activity: Not on file   Other Topics Concern  . Not on file   Social History Narrative  . No narrative on file    Current Outpatient Prescriptions on File Prior to Visit  Medication Sig Dispense Refill  . albuterol (VENTOLIN HFA) 108 (90 BASE) MCG/ACT inhaler Inhale 2 puffs into the lungs every 6 (six) hours as needed.      . Blood Glucose Monitoring Suppl (FREESTYLE LITE) DEVI       . clotrimazole-betamethasone (LOTRISONE) cream Apply topically at bedtime.  45 g  1  . Cyanocobalamin (VITAMIN B 12 PO) Take 1,000 mcg by mouth daily.      Marland Kitchen estradiol (ESTRACE) 1 MG tablet Take 1 mg by mouth daily.      . fluocinonide cream (LIDEX) 0.05 % Apply topically 2 (two) times daily.      Marland Kitchen glucose blood (FREESTYLE LITE) test strip Use as instructed  50 each  0  . ketoconazole (NIZORAL) 2 % shampoo Apply topically 2 (two) times a week.  Wash, leave 5 minutes and wash off  120 mL  0  . Lancets (FREESTYLE) lancets Use as instructed  100 each  1  . levothyroxine (SYNTHROID, LEVOTHROID) 100 MCG tablet TAKE 1 TABLET DAILY BEFORE BREAKFAST  90 tablet  1  . LORazepam (ATIVAN) 0.5 MG tablet Take 0.5 mg by mouth 2 (two) times daily as needed.      . metFORMIN (GLUCOPHAGE) 500 MG tablet Take 1 tablet (500 mg total) by mouth 2 (two) times daily with a meal.  60 tablet  0  . olmesartan (BENICAR) 10 mg TABS tablet 1 tab po daily  90 tablet  1  . PROTOPIC 0.1 % ointment        No current facility-administered medications on file prior to visit.    Allergies  Allergen Reactions  . Clarithromycin     Rash  . Codeine     vomiting    Review of Systems  Review of Systems  Constitutional: Negative for fever and malaise/fatigue.  HENT: Negative for congestion.   Eyes: Negative for discharge.  Respiratory:  Negative for shortness of breath.   Cardiovascular: Negative for chest pain, palpitations and leg swelling.  Gastrointestinal: Negative for nausea, abdominal pain and diarrhea.  Genitourinary: Positive for urgency and frequency. Negative for dysuria and hematuria.  Musculoskeletal: Negative for falls.  Skin: Negative for rash.  Neurological: Negative for loss of consciousness and headaches.  Endo/Heme/Allergies: Negative for polydipsia.  Psychiatric/Behavioral: Negative for depression and suicidal ideas. The patient is not nervous/anxious and does not have insomnia.     Objective  BP 102/62  Pulse 91  Temp(Src) 98.2 F (36.8 C) (Oral)  Ht 5' 2.25" (1.581 m)  Wt 167 lb 1.9 oz (75.805 kg)  BMI 30.33 kg/m2  SpO2 94%  Physical Exam  Physical Exam  Constitutional: She is oriented to person, place, and time and well-developed, well-nourished, and in no distress. No distress.  HENT:  Head: Normocephalic and atraumatic.  Eyes: Conjunctivae are normal.  Neck: Neck supple. No thyromegaly present.  Cardiovascular: Normal rate, regular rhythm and normal heart sounds.   No murmur heard. Pulmonary/Chest: Effort normal and breath sounds normal. She has no wheezes.  Abdominal: She exhibits no distension and no mass.  Musculoskeletal: She exhibits no edema.  Lymphadenopathy:    She has no cervical adenopathy.  Neurological: She is alert and oriented to person, place, and time.  Skin: Skin is warm and dry. No rash noted. She is not diaphoretic.  Psychiatric: Memory, affect and judgment normal.    Lab Results  Component Value Date   TSH 0.343* 07/04/2013   Lab Results  Component Value Date   WBC 7.2 07/04/2013   HGB 13.6 07/04/2013   HCT 38.9 07/04/2013   MCV 89.2 07/04/2013   PLT 235 07/04/2013   Lab Results  Component Value Date   CREATININE 0.68 07/04/2013   BUN 14 07/04/2013   NA 141 07/04/2013   K 4.2 07/04/2013   CL 103 07/04/2013   CO2 30 07/04/2013   Lab Results  Component  Value Date   ALT 19 07/04/2013   AST 14 07/04/2013   ALKPHOS 85 07/04/2013   BILITOT 0.5 07/04/2013   Lab Results  Component Value Date   CHOL 138 07/04/2013   Lab Results  Component Value Date   HDL 37* 07/04/2013   Lab Results  Component Value Date   LDLCALC 78 07/04/2013   Lab Results  Component Value Date   TRIG 114 07/04/2013  Lab Results  Component Value Date   CHOLHDL 3.7 07/04/2013     Assessment & Plan   B12 deficiency Negative Intrinsic Factor June 2015, has not been taking supplements routinely, encouraged daily po intake and monitor  Hypothyroidism On Levothyroxine, continue to monitor  Type II or unspecified type diabetes mellitus without mention of complication, uncontrolled hgba1c acceptable, minimize simple carbs. Increase exercise as tolerated. Continue current meds  Overactive bladder Negative urine culture, consider kegel exercises

## 2014-05-15 LAB — URINALYSIS
Bilirubin Urine: NEGATIVE
Glucose, UA: NEGATIVE mg/dL
HGB URINE DIPSTICK: NEGATIVE
Ketones, ur: NEGATIVE mg/dL
NITRITE: NEGATIVE
PROTEIN: NEGATIVE mg/dL
Specific Gravity, Urine: 1.016 (ref 1.005–1.030)
Urobilinogen, UA: 1 mg/dL (ref 0.0–1.0)
pH: 6 (ref 5.0–8.0)

## 2014-05-15 LAB — INTRINSIC FACTOR ANTIBODIES: Intrinsic Factor: NEGATIVE

## 2014-05-16 LAB — URINE CULTURE: Colony Count: 30000

## 2014-05-20 ENCOUNTER — Encounter: Payer: Self-pay | Admitting: Family Medicine

## 2014-05-20 NOTE — Assessment & Plan Note (Addendum)
Negative Intrinsic Factor June 2015, has not been taking supplements routinely, encouraged daily po intake and monitor

## 2014-05-20 NOTE — Assessment & Plan Note (Signed)
>>  ASSESSMENT AND PLAN FOR TYPE 2 DIABETES MELLITUS WITH OBESITY WRITTEN ON 05/20/2014  1:00 PM BY BLYTH, STACEY A, MD  hgba1c acceptable, minimize simple carbs. Increase exercise as tolerated. Continue current meds

## 2014-05-20 NOTE — Assessment & Plan Note (Signed)
hgba1c acceptable, minimize simple carbs. Increase exercise as tolerated. Continue current meds 

## 2014-05-20 NOTE — Assessment & Plan Note (Signed)
Negative urine culture, consider kegel exercises

## 2014-05-20 NOTE — Assessment & Plan Note (Signed)
On Levothyroxine, continue to monitor 

## 2014-05-22 ENCOUNTER — Telehealth: Payer: Self-pay | Admitting: Family Medicine

## 2014-05-22 ENCOUNTER — Other Ambulatory Visit: Payer: Self-pay | Admitting: Family Medicine

## 2014-05-22 MED ORDER — METFORMIN HCL 500 MG PO TABS: 500.0000 mg | ORAL_TABLET | Freq: Two times a day (BID) | ORAL | Status: DC

## 2014-05-22 NOTE — Telephone Encounter (Signed)
[facility]- [facility]  [facility] [id number] in [place]

## 2014-06-07 ENCOUNTER — Telehealth: Payer: Self-pay | Admitting: Family Medicine

## 2014-06-07 NOTE — Telephone Encounter (Signed)
Sig can read check blood sugar and as needed for symptoms

## 2014-06-07 NOTE — Telephone Encounter (Signed)
Please advise 

## 2014-06-07 NOTE — Telephone Encounter (Signed)
Freestyle lite test strip qty 50 needs sig other than use as directed  [facility] [place]

## 2014-06-08 MED ORDER — GLUCOSE BLOOD VI STRP: ORAL_STRIP | Status: DC

## 2014-06-08 NOTE — Telephone Encounter (Signed)
Rx sent 

## 2014-07-25 ENCOUNTER — Other Ambulatory Visit: Payer: Self-pay | Admitting: Family Medicine

## 2014-09-25 ENCOUNTER — Other Ambulatory Visit: Payer: Self-pay | Admitting: Family Medicine

## 2014-09-25 NOTE — Telephone Encounter (Signed)
Freestyle Lite test strips refilled per protocol. JG//CMA

## 2014-09-28 ENCOUNTER — Other Ambulatory Visit: Payer: Self-pay

## 2014-10-30 ENCOUNTER — Other Ambulatory Visit: Payer: Self-pay | Admitting: Family Medicine

## 2014-10-31 ENCOUNTER — Other Ambulatory Visit: Payer: Self-pay

## 2014-10-31 ENCOUNTER — Telehealth: Payer: Self-pay

## 2014-10-31 NOTE — Telephone Encounter (Signed)
Medication Detail      Disp Refills Start End     metFORMIN (GLUCOPHAGE) 500 MG tablet 60 tablet 4 10/30/2014     Sig: TAKE 1 TABLET TWICE A DAY WITH MEALS    E-Prescribing Status: Receipt confirmed by pharmacy (10/30/2014 7:33 AM EST)     Pharmacy    [id number] [place], [place] - [phone]

## 2014-10-31 NOTE — Telephone Encounter (Signed)
Rx sent on 10/30/14, #60 x 4 refills. Denial sent to pharmacy.

## 2014-11-12 ENCOUNTER — Other Ambulatory Visit: Payer: Self-pay | Admitting: Family Medicine

## 2014-11-29 ENCOUNTER — Other Ambulatory Visit: Payer: Self-pay | Admitting: Family Medicine

## 2014-11-29 ENCOUNTER — Other Ambulatory Visit: Payer: Self-pay

## 2014-12-04 ENCOUNTER — Emergency Department (HOSPITAL_BASED_OUTPATIENT_CLINIC_OR_DEPARTMENT_OTHER): Payer: BC Managed Care – PPO

## 2014-12-04 ENCOUNTER — Emergency Department (HOSPITAL_BASED_OUTPATIENT_CLINIC_OR_DEPARTMENT_OTHER)
Admission: EM | Admit: 2014-12-04 | Discharge: 2014-12-04 | Disposition: A | Discharge status: Home / Self Care | Payer: BC Managed Care – PPO | Attending: Emergency Medicine | Admitting: Emergency Medicine

## 2014-12-04 ENCOUNTER — Encounter (HOSPITAL_BASED_OUTPATIENT_CLINIC_OR_DEPARTMENT_OTHER): Payer: Self-pay | Admitting: Emergency Medicine

## 2014-12-04 DIAGNOSIS — Z23 Encounter for immunization: Secondary | ICD-10-CM | POA: Diagnosis not present

## 2014-12-04 DIAGNOSIS — W01198A Fall on same level from slipping, tripping and stumbling with subsequent striking against other object, initial encounter: Secondary | ICD-10-CM | POA: Diagnosis not present

## 2014-12-04 DIAGNOSIS — M79604 Pain in right leg: Secondary | ICD-10-CM

## 2014-12-04 DIAGNOSIS — Y9289 Other specified places as the place of occurrence of the external cause: Secondary | ICD-10-CM | POA: Insufficient documentation

## 2014-12-04 DIAGNOSIS — Z8719 Personal history of other diseases of the digestive system: Secondary | ICD-10-CM | POA: Insufficient documentation

## 2014-12-04 DIAGNOSIS — E039 Hypothyroidism, unspecified: Secondary | ICD-10-CM | POA: Insufficient documentation

## 2014-12-04 DIAGNOSIS — J45909 Unspecified asthma, uncomplicated: Secondary | ICD-10-CM | POA: Diagnosis not present

## 2014-12-04 DIAGNOSIS — Y998 Other external cause status: Secondary | ICD-10-CM | POA: Diagnosis not present

## 2014-12-04 DIAGNOSIS — Z8619 Personal history of other infectious and parasitic diseases: Secondary | ICD-10-CM | POA: Diagnosis not present

## 2014-12-04 DIAGNOSIS — Z8739 Personal history of other diseases of the musculoskeletal system and connective tissue: Secondary | ICD-10-CM | POA: Insufficient documentation

## 2014-12-04 DIAGNOSIS — E119 Type 2 diabetes mellitus without complications: Secondary | ICD-10-CM | POA: Diagnosis not present

## 2014-12-04 DIAGNOSIS — Y9389 Activity, other specified: Secondary | ICD-10-CM | POA: Insufficient documentation

## 2014-12-04 DIAGNOSIS — S8992XA Unspecified injury of left lower leg, initial encounter: Secondary | ICD-10-CM | POA: Insufficient documentation

## 2014-12-04 DIAGNOSIS — I10 Essential (primary) hypertension: Secondary | ICD-10-CM | POA: Diagnosis not present

## 2014-12-04 DIAGNOSIS — W19XXXA Unspecified fall, initial encounter: Secondary | ICD-10-CM

## 2014-12-04 IMAGING — CR DG KNEE COMPLETE 4+V*R*
4 series · 4 of 4 positions shown · non-contrast
Comparison: None.

CLINICAL DATA: Fall last night.  Pain.  Initial encounter

EXAM:
RIGHT KNEE - COMPLETE 4+ VIEW

[t knee ap right]
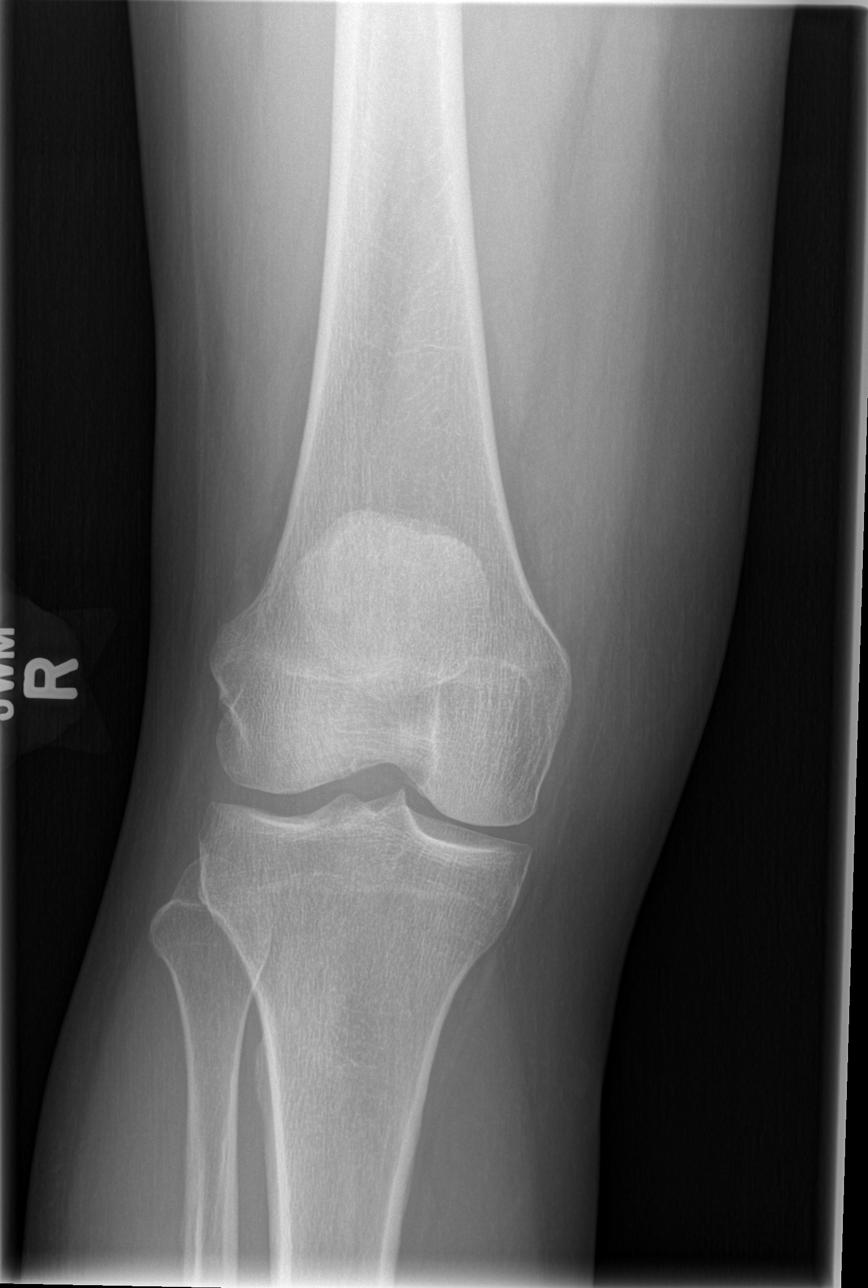

[t knee oblique right (1 of 2)]
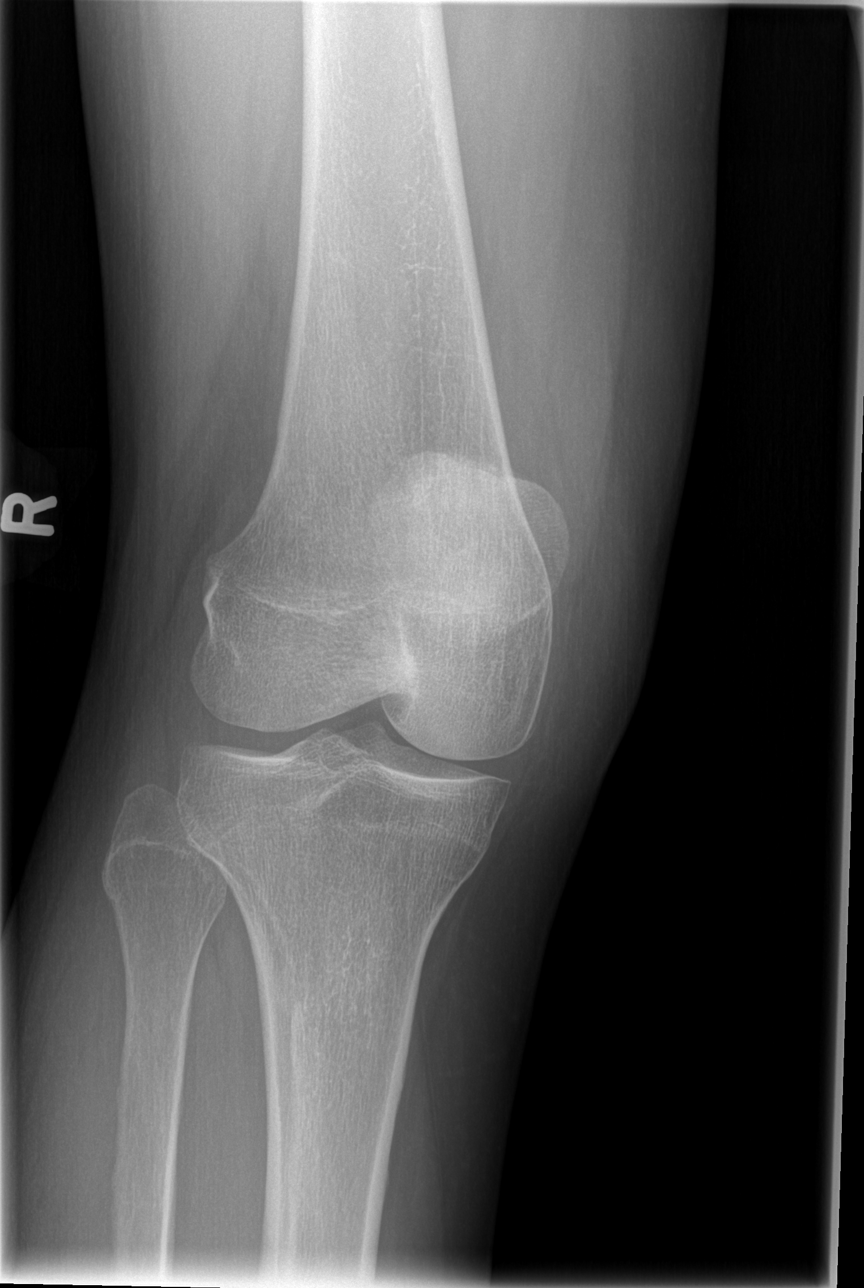

[t knee oblique right (2 of 2)]
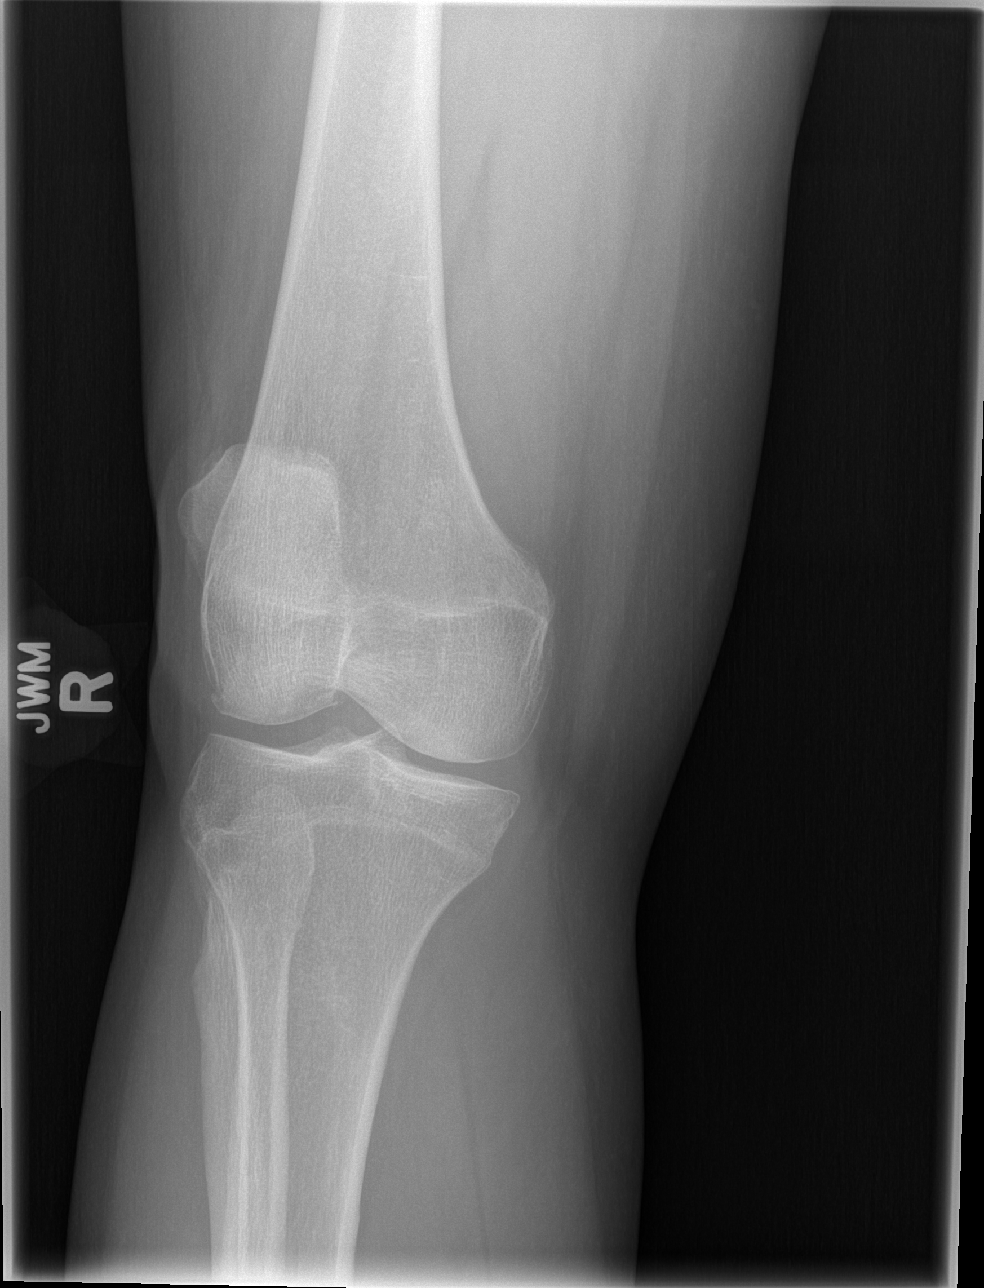

[t knee lat right]
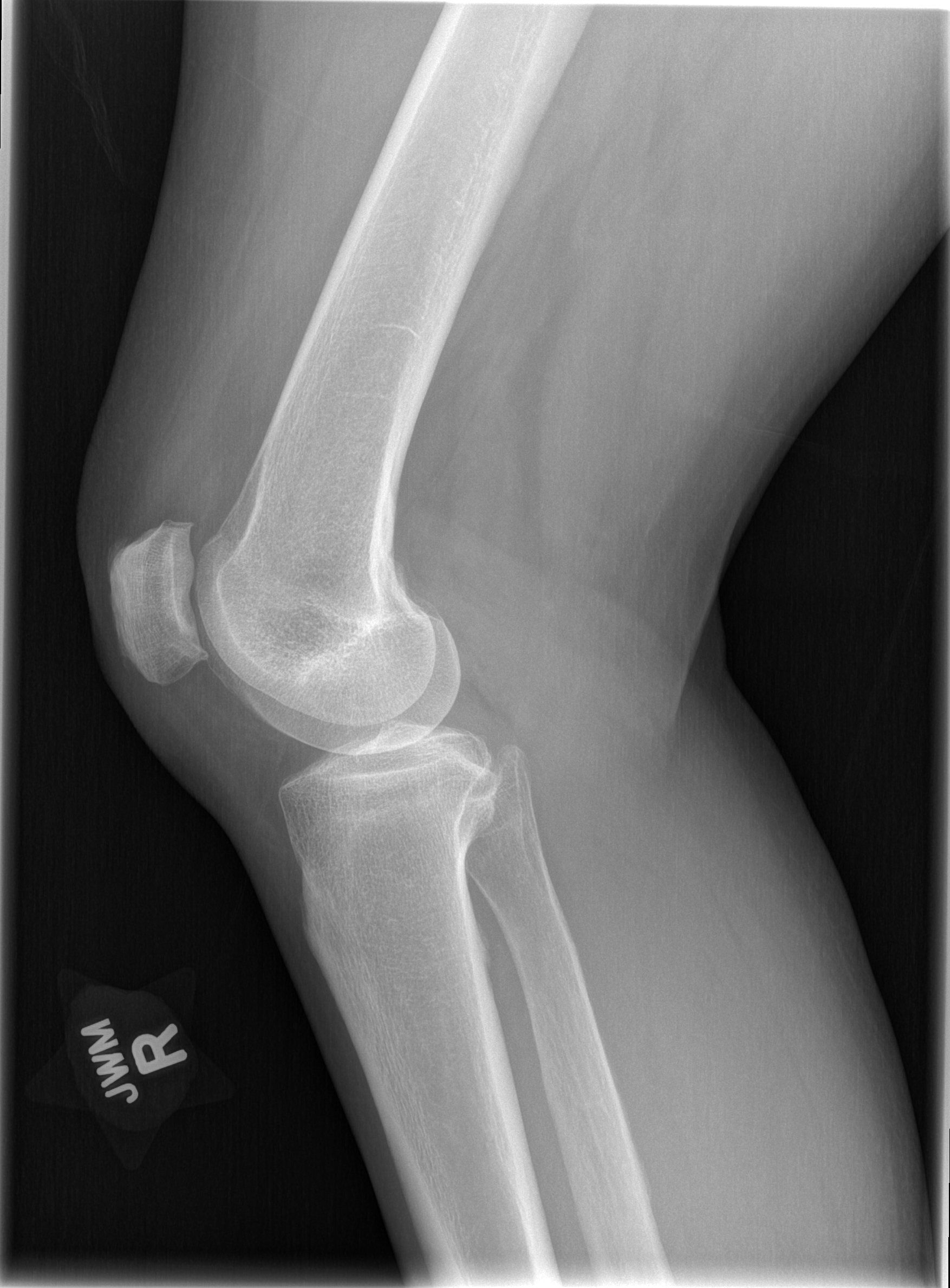

[4 of 4 positions shown; findings below may reference images not displayed]

FINDINGS: Normal alignment no fracture. Mild degenerative change in spurring
involving the patella. No joint effusion. Medial lateral joint
spaces intact.
IMPRESSION: Mild degenerative changes of  the patella.  Negative for fracture.

## 2014-12-04 MED ORDER — HYDROCODONE-ACETAMINOPHEN 5-325 MG PO TABS: 2.0000 | ORAL_TABLET | ORAL | Status: DC | PRN

## 2014-12-04 MED ORDER — IBUPROFEN 800 MG PO TABS: 800.0000 mg | ORAL_TABLET | Freq: Three times a day (TID) | ORAL | Status: DC

## 2014-12-04 MED ORDER — TETANUS-DIPHTH-ACELL PERTUSSIS 5-2.5-18.5 LF-MCG/0.5 IM SUSP
0.5000 mL | Freq: Once | INTRAMUSCULAR | Status: AC
  Administered 2014-12-04: 0.5 mL via INTRAMUSCULAR
  Filled 2014-12-04: qty 0.5

## 2014-12-04 NOTE — ED Notes (Signed)
Patient states she does not want a cam walker and has one at home.

## 2014-12-04 NOTE — Discharge Instructions (Signed)
Arthralgia Follow up with Dr. Abner GreenspanBlyth. If you continue to have pain in her calf you may need to have an MRI to assess for muscle damage. Keep the leg elevated, apply ice and anti-inflammatories. Return to the ED if he develop new or worsening symptoms Your caregiver has diagnosed you as suffering from an arthralgia. Arthralgia means there is pain in a joint. This can come from many reasons including:  Bruising the joint which causes soreness (inflammation) in the joint.  Wear and tear on the joints which occur as we grow older (osteoarthritis).  Overusing the joint.  Various forms of arthritis.  Infections of the joint. Regardless of the cause of pain in your joint, most of these different pains respond to anti-inflammatory drugs and rest. The exception to this is when a joint is infected, and these cases are treated with antibiotics, if it is a bacterial infection. HOME CARE INSTRUCTIONS   Rest the injured area for as long as directed by your caregiver. Then slowly start using the joint as directed by your caregiver and as the pain allows. Crutches as directed may be useful if the ankles, knees or hips are involved. If the knee was splinted or casted, continue use and care as directed. If an stretchy or elastic wrapping bandage has been applied today, it should be removed and re-applied every 3 to 4 hours. It should not be applied tightly, but firmly enough to keep swelling down. Watch toes and feet for swelling, bluish discoloration, coldness, numbness or excessive pain. If any of these problems (symptoms) occur, remove the ace bandage and re-apply more loosely. If these symptoms persist, contact your caregiver or return to this location.  For the first 24 hours, keep the injured extremity elevated on pillows while lying down.  Apply ice for 15-20 minutes to the sore joint every couple hours while awake for the first half day. Then 03-04 times per day for the first 48 hours. Put the ice in a  plastic bag and place a towel between the bag of ice and your skin.  Wear any splinting, casting, elastic bandage applications, or slings as instructed.  Only take over-the-counter or prescription medicines for pain, discomfort, or fever as directed by your caregiver. Do not use aspirin immediately after the injury unless instructed by your physician. Aspirin can cause increased bleeding and bruising of the tissues.  If you were given crutches, continue to use them as instructed and do not resume weight bearing on the sore joint until instructed. Persistent pain and inability to use the sore joint as directed for more than 2 to 3 days are warning signs indicating that you should see a caregiver for a follow-up visit as soon as possible. Initially, a hairline fracture (break in bone) may not be evident on X-rays. Persistent pain and swelling indicate that further evaluation, non-weight bearing or use of the joint (use of crutches or slings as instructed), or further X-rays are indicated. X-rays may sometimes not show a small fracture until a week or 10 days later. Make a follow-up appointment with your own caregiver or one to whom we have referred you. A radiologist (specialist in reading X-rays) may read your X-rays. Make sure you know how you are to obtain your X-ray results. Do not assume everything is normal if you do not hear from us. SEEK MEDICAL CARE IF: Bruising, swelling, or pain increases. SEEK IMMEDIATE MEDICAL CARE IF:   Your fingers or toes are numb or blue.  The pain is  not responding to medications and continues to stay the same or get worse.  The pain in your joint becomes severe.  You develop a fever over 102 F (38.9 C).  It becomes impossible to move or use the joint. MAKE SURE YOU:   Understand these instructions.  Will watch your condition.  Will get help right away if you are not doing well or get worse. Document Released: 11/30/2005 Document Revised: 02/22/2012  Document Reviewed: 07/18/2008 [facility] Patient Information 2015 [employer] [place]. This information is not intended to replace advice given to you by your health care provider. Make sure you discuss any questions you have with your health care provider.

## 2014-12-04 NOTE — ED Provider Notes (Signed)
CSN: [id number]     Arrival date & time 12/04/14  [id number] History  This chart was scribed for Tiffany OctaveStephen Shantell Belongia, MD by Leone PayorSonum Mckenzie, ED Scribe. This patient was seen in room MH11/MH11 and the patient's care was started 10:41 AM.    Chief Complaint  Patient presents with  . Fall  . Leg Pain    The history is provided by the patient. No language interpreter was used.     HPI Comments: Tiffany Mckenzie is a 58 y.o. female who presents to the Emergency Department complaining of a fall that occurred PTA. Patient states she was walking down steps at a theater when she slipped and fell, striking her right knee and right calf. She complains of constant right calf pain that is worse with bearing weight and ambulation. She also has abrasions to the right knee and right lower extremities. She denies chest pain, SOB, weakness, dizziness, lightheadedness. She denies taking anticoagulants. Last tetanus is unknown.   Past Medical History  Diagnosis Date  . Asthma   . Arthritis   . History of chicken pox   . Allergic rhinitis   . History of hepatitis     age 89  . Hypertension   . Hypothyroidism   . Hyperglycemia 09/13/2012  . Type II or unspecified type diabetes mellitus without mention of complication, uncontrolled 09/13/2012  . Overactive bladder 03/17/2013    Seeing urology   Past Surgical History  Procedure Laterality Date  . Tonsillectomy    . Abdominal hysterectomy  2002   Family History  Problem Relation Age of Onset  . Alcohol abuse Father   . Cancer Father     lung  . Diabetes Father   . Arthritis Mother   . Heart disease Mother     Blockage & double bypass  . Hypertension Mother   . Diabetes Mother   . Colon cancer Paternal Grandmother   . Stroke Paternal Grandmother     maternal grandfather  . Breast cancer Maternal Grandmother   . Heart disease Maternal Grandfather   . Scoliosis Daughter   . Cancer Paternal Grandfather     lung   History  Substance Use Topics  . Smoking status:  Never Smoker   . Smokeless tobacco: Never Used  . Alcohol Use: Yes     Comment: less than once a month, white wine or mixed drink   OB History    No data available     Review of Systems  A complete 10 system review of systems was obtained and all systems are negative except as noted in the HPI and PMH.    Allergies  Clarithromycin and Codeine  Home Medications   Prior to Admission medications   Medication Sig Start Date End Date Taking? Authorizing Provider  BENICAR 20 MG tablet TAKE 1/2 TABLET BY MOUTH DAILY 11/29/14  Yes Bradd CanaryStacey A Blyth, MD  levothyroxine (SYNTHROID, LEVOTHROID) 100 MCG tablet TAKE 1 TABLET DAILY BEFORE BREAKFAST 11/12/14  Yes Bradd CanaryStacey A Blyth, MD  metFORMIN (GLUCOPHAGE) 500 MG tablet TAKE 1 TABLET TWICE A DAY WITH MEALS 10/30/14  Yes Bradd CanaryStacey A Blyth, MD  albuterol (VENTOLIN HFA) 108 (90 BASE) MCG/ACT inhaler Inhale 2 puffs into the lungs every 6 (six) hours as needed.    Historical Provider, MD  Blood Glucose Monitoring Suppl (FREESTYLE LITE) DEVI  03/30/13   Historical Provider, MD  clotrimazole-betamethasone (LOTRISONE) cream Apply topically at bedtime. 04/21/13   Bradd CanaryStacey A Blyth, MD  Cyanocobalamin (VITAMIN B 12 PO) Take 1,000 mcg  by mouth daily. 02/18/12   Historical Provider, MD  estradiol (ESTRACE) 1 MG tablet Take 1 mg by mouth daily.    Historical Provider, MD  fluocinonide cream (LIDEX) 0.05 % Apply topically 2 (two) times daily.    Historical Provider, MD  FREESTYLE LITE test strip CHECK BLOOD SUGAR ONCE DAILY AS NEEDED FOR SYMPTOMS 09/25/14   [name], MD  glucose blood test strip Test Blood Sugar Once Daily As Needed For Sypmtoms 04/23/14   [name], MD  HYDROcodone-acetaminophen (NORCO/VICODIN) 5-325 MG per tablet Take 2 tablets by mouth every 4 (four) hours as needed. 12/04/14   Tiffany Octave, MD  ibuprofen (ADVIL,MOTRIN) 800 MG tablet Take 1 tablet (800 mg total) by mouth 3 (three) times daily. 12/04/14   Tiffany Octave, MD  ketoconazole  (NIZORAL) 2 % shampoo Apply topically 2 (two) times a week. Wash, leave 5 minutes and wash off 04/21/13   [name], MD  Lancets (FREESTYLE) lancets Test Blood Sugar Once Daily As Needed For Symptoms 04/21/13   [name], MD  LORazepam (ATIVAN) 0.5 MG tablet Take 0.5 mg by mouth 2 (two) times daily as needed.    Historical Provider, MD  PROTOPIC 0.1 % ointment  06/08/13   Historical Provider, MD   BP 130/88 mmHg  Pulse 80  Temp(Src) 98.4 F (36.9 C) (Oral)  Resp 16  Ht 5\' 2"  (1.575 m)  Wt 168 lb (76.204 kg)  BMI 30.72 kg/m2  SpO2 99% Physical Exam  Constitutional: She is oriented to person, place, and time. She appears well-developed and well-nourished. No distress.  HENT:  Head: Normocephalic and atraumatic.  Mouth/Throat: Oropharynx is clear and moist. No oropharyngeal exudate.  Eyes: Conjunctivae and EOM are normal. Pupils are equal, round, and reactive to light.  Neck: Normal range of motion. Neck supple.  No meningismus.  Cardiovascular: Normal rate, regular rhythm, normal heart sounds and intact distal pulses.   No murmur heard. Pulmonary/Chest: Effort normal and breath sounds normal. No respiratory distress.  Abdominal: Soft. There is no tenderness. There is no rebound and no guarding.  Musculoskeletal: Normal range of motion. She exhibits tenderness. She exhibits no edema.  RLE: Abrasion to right knee. Calf tenderness without bony tenderness. Compartments are soft. Achilles tendon intact. Intact DP/PT pulse.  ROM of hips and ankles intact.  Neurological: She is alert and oriented to person, place, and time. No cranial nerve deficit. She exhibits normal muscle tone. Coordination normal.  No ataxia on finger to nose bilaterally. No pronator drift. 5/5 strength throughout. CN 2-12 intact. Negative Romberg. Equal grip strength. Sensation intact. Gait is normal.   Skin: Skin is warm.  Psychiatric: She has a normal mood and affect. Her behavior is normal.  Nursing note and  vitals reviewed.   ED Course  Procedures (including critical care time)  DIAGNOSTIC STUDIES: Oxygen Saturation is 99% on RA, normal by my interpretation.    COORDINATION OF CARE: 10:45 AM Discussed treatment plan with pt at bedside and pt agreed to plan.  12:13 PM Updated patient on imaging results.     Labs Review Labs Reviewed - No data to display  Imaging Review Dg Tibia/fibula Right  12/04/2014   CLINICAL DATA:  58 year old female who fell last night with pain. Initial encounter.  EXAM: RIGHT TIBIA AND FIBULA - 2 VIEW  COMPARISON:  Right knee series today reported separately.  FINDINGS: The mid and distal right tibia and fibula are included. Bone mineralization within normal limits. Mortise joint alignment  preserved. Calcaneus intact. Cortical thickening along the proximal third of the right fibular shaft most resembles a healed fracture. No acute fracture dislocation of the right tibia or fibula identified.  IMPRESSION: 1. Suspect healed fracture of the proximal third right fibula shaft. 2. No acute fracture or dislocation identified about the visible right tib-fib.   Electronically Signed   By: Augusto GambleLee  Hall M.D.   On: 12/04/2014 11:32   Dg Knee Complete 4 Views Right  12/04/2014   CLINICAL DATA:  Fall last night.  Pain.  Initial encounter  EXAM: RIGHT KNEE - COMPLETE 4+ VIEW  COMPARISON:  None.  FINDINGS: Normal alignment no fracture. Mild degenerative change in spurring involving the patella. No joint effusion. Medial lateral joint spaces intact.  IMPRESSION: Mild degenerative changes of  the patella.  Negative for fracture.   Electronically Signed   By: Marlan Palauharles  Clark M.D.   On: 12/04/2014 11:32     EKG Interpretation None      MDM   Final diagnoses:  Fall  Leg pain, diffuse, right   Mechanical slip and fall down one step complaining of right calf and knee pain. Did not hit head or loss of consciousness.  Right calf tenderness on exam. Intact pulses. Achilles intact.  Doubt DVT as no pain before fall.  Xrays negative.  Questionable old fracture of proximal fibula. Patient does not recall this. Tetanus updated. Suspect soft tissue injury, possible muscle tear.  Compartments soft at this time. May need MRI to assess soft tissues.  Recommend ice, elevation, NSAIDs, cam walker.  Follow up with PCP/sports med.  Return to the ED with worsening pain, paresthesias, weakness, or any other concerns.  I personally performed the services described in this documentation, which was scribed in my presence. The recorded information has been reviewed and is accurate.   Tiffany OctaveStephen Wai Minotti, MD 12/04/14 [phone]

## 2014-12-04 NOTE — ED Notes (Signed)
Fell down one step while at movie theater. Pt complaining of abrasion to knee and right calf pain.

## 2014-12-17 ENCOUNTER — Encounter: Payer: BC Managed Care – PPO | Admitting: Family Medicine

## 2015-01-16 NOTE — Telephone Encounter (Signed)
Error encounter. 

## 2015-02-25 ENCOUNTER — Telehealth: Payer: Self-pay

## 2015-02-25 NOTE — Telephone Encounter (Signed)
LM for C/B. 

## 2015-02-26 ENCOUNTER — Ambulatory Visit (INDEPENDENT_AMBULATORY_CARE_PROVIDER_SITE_OTHER): Payer: BLUE CROSS/BLUE SHIELD | Admitting: Family Medicine

## 2015-02-26 ENCOUNTER — Encounter: Payer: Self-pay | Admitting: Family Medicine

## 2015-02-26 VITALS — BP 110/64 | HR 84 | Temp 98.6°F | Ht 62.0 in | Wt 173.2 lb

## 2015-02-26 DIAGNOSIS — I7300 Raynaud's syndrome without gangrene: Secondary | ICD-10-CM

## 2015-02-26 DIAGNOSIS — J4520 Mild intermittent asthma, uncomplicated: Secondary | ICD-10-CM

## 2015-02-26 DIAGNOSIS — E538 Deficiency of other specified B group vitamins: Secondary | ICD-10-CM

## 2015-02-26 DIAGNOSIS — E039 Hypothyroidism, unspecified: Secondary | ICD-10-CM

## 2015-02-26 DIAGNOSIS — E1169 Type 2 diabetes mellitus with other specified complication: Secondary | ICD-10-CM

## 2015-02-26 DIAGNOSIS — Z0000 Encounter for general adult medical examination without abnormal findings: Secondary | ICD-10-CM

## 2015-02-26 DIAGNOSIS — K5900 Constipation, unspecified: Secondary | ICD-10-CM

## 2015-02-26 DIAGNOSIS — T7840XD Allergy, unspecified, subsequent encounter: Secondary | ICD-10-CM

## 2015-02-26 DIAGNOSIS — E669 Obesity, unspecified: Secondary | ICD-10-CM

## 2015-02-26 MED ORDER — OLMESARTAN MEDOXOMIL 20 MG PO TABS: 10.0000 mg | ORAL_TABLET | Freq: Every day | ORAL | Status: DC

## 2015-02-26 MED ORDER — NEOMYCIN-POLYMYXIN-HC 3.5-10000-1 OT SOLN: 3.0000 [drp] | Freq: Three times a day (TID) | OTIC | Status: DC

## 2015-02-26 MED ORDER — LEVOTHYROXINE SODIUM 100 MCG PO TABS: 100.0000 ug | ORAL_TABLET | Freq: Every day | ORAL | Status: DC

## 2015-02-26 MED ORDER — METFORMIN HCL 500 MG PO TABS: 500.0000 mg | ORAL_TABLET | Freq: Two times a day (BID) | ORAL | Status: DC

## 2015-02-26 NOTE — Patient Instructions (Signed)
Probiotics daily such as Digestive Advantage or [facility] Fiber supplement daily Metamucil/Psyllium or Benefiber daily, 64 oz clear fluids   Preventive Care for Adults A healthy lifestyle and preventive care can promote health and wellness. Preventive health guidelines for women include the following key practices.  A routine yearly physical is a good way to check with your health care provider about your health and preventive screening. It is a chance to share any concerns and updates on your health and to receive a thorough exam.  Visit your dentist for a routine exam and preventive care every 6 months. Brush your teeth twice a day and floss once a day. Good oral hygiene prevents tooth decay and gum disease.  The frequency of eye exams is based on your age, health, family medical history, use of contact lenses, and other factors. Follow your health care provider's recommendations for frequency of eye exams.  Eat a healthy diet. Foods like vegetables, fruits, whole grains, low-fat dairy products, and lean protein foods contain the nutrients you need without too many calories. Decrease your intake of foods high in solid fats, added sugars, and salt. Eat the right amount of calories for you.Get information about a proper diet from your health care provider, if necessary.  Regular physical exercise is one of the most important things you can do for your health. Most adults should get at least 150 minutes of moderate-intensity exercise (any activity that increases your heart rate and causes you to sweat) each week. In addition, most adults need muscle-strengthening exercises on 2 or more days a week.  Maintain a healthy weight. The body mass index (BMI) is a screening tool to identify possible weight problems. It provides an estimate of body fat based on height and weight. Your health care provider can find your BMI and can help you achieve or maintain a healthy weight.For adults 20 years  and older:  A BMI below 18.5 is considered underweight.  A BMI of 18.5 to 24.9 is normal.  A BMI of 25 to 29.9 is considered overweight.  A BMI of 30 and above is considered obese.  Maintain normal blood lipids and cholesterol levels by exercising and minimizing your intake of saturated fat. Eat a balanced diet with plenty of fruit and vegetables. Blood tests for lipids and cholesterol should begin at age 62 and be repeated every 5 years. If your lipid or cholesterol levels are high, you are over 50, or you are at high risk for heart disease, you may need your cholesterol levels checked more frequently.Ongoing high lipid and cholesterol levels should be treated with medicines if diet and exercise are not working.  If you smoke, find out from your health care provider how to quit. If you do not use tobacco, do not start.  Lung cancer screening is recommended for adults aged 78-80 years who are at high risk for developing lung cancer because of a history of smoking. A yearly low-dose CT scan of the lungs is recommended for people who have at least a 30-pack-year history of smoking and are a current smoker or have quit within the past 15 years. A pack year of smoking is smoking an average of 1 pack of cigarettes a day for 1 year (for example: 1 pack a day for 30 years or 2 packs a day for 15 years). Yearly screening should continue until the smoker has stopped smoking for at least 15 years. Yearly screening should be stopped for people who develop a health  problem that would prevent them from having lung cancer treatment.  If you are pregnant, do not drink alcohol. If you are breastfeeding, be very cautious about drinking alcohol. If you are not pregnant and choose to drink alcohol, do not have more than 1 drink per day. One drink is considered to be 12 ounces (355 mL) of beer, 5 ounces (148 mL) of wine, or 1.5 ounces (44 mL) of liquor.  Avoid use of street drugs. Do not share needles with anyone.  Ask for help if you need support or instructions about stopping the use of drugs.  High blood pressure causes heart disease and increases the risk of stroke. Your blood pressure should be checked at least every 1 to 2 years. Ongoing high blood pressure should be treated with medicines if weight loss and exercise do not work.  If you are 64-63 years old, ask your health care provider if you should take aspirin to prevent strokes.  Diabetes screening involves taking a blood sample to check your fasting blood sugar level. This should be done once every 3 years, after age 52, if you are within normal weight and without risk factors for diabetes. Testing should be considered at a younger age or be carried out more frequently if you are overweight and have at least 1 risk factor for diabetes.  Breast cancer screening is essential preventive care for women. You should practice "breast self-awareness." This means understanding the normal appearance and feel of your breasts and may include breast self-examination. Any changes detected, no matter how small, should be reported to a health care provider. Women in their 36s and 30s should have a clinical breast exam (CBE) by a health care provider as part of a regular health exam every 1 to 3 years. After age 58, women should have a CBE every year. Starting at age 76, women should consider having a mammogram (breast X-ray test) every year. Women who have a family history of breast cancer should talk to their health care provider about genetic screening. Women at a high risk of breast cancer should talk to their health care providers about having an MRI and a mammogram every year.  Breast cancer gene (BRCA)-related cancer risk assessment is recommended for women who have family members with BRCA-related cancers. BRCA-related cancers include breast, ovarian, tubal, and peritoneal cancers. Having family members with these cancers may be associated with an increased risk  for harmful changes (mutations) in the breast cancer genes BRCA1 and BRCA2. Results of the assessment will determine the need for genetic counseling and BRCA1 and BRCA2 testing.  Routine pelvic exams to screen for cancer are no longer recommended for nonpregnant women who are considered low risk for cancer of the pelvic organs (ovaries, uterus, and vagina) and who do not have symptoms. Ask your health care provider if a screening pelvic exam is right for you.  If you have had past treatment for cervical cancer or a condition that could lead to cancer, you need Pap tests and screening for cancer for at least 20 years after your treatment. If Pap tests have been discontinued, your risk factors (such as having a new sexual partner) need to be reassessed to determine if screening should be resumed. Some women have medical problems that increase the chance of getting cervical cancer. In these cases, your health care provider may recommend more frequent screening and Pap tests.  The HPV test is an additional test that may be used for cervical cancer screening. The HPV  test looks for the virus that can cause the cell changes on the cervix. The cells collected during the Pap test can be tested for HPV. The HPV test could be used to screen women aged 75 years and older, and should be used in women of any age who have unclear Pap test results. After the age of 57, women should have HPV testing at the same frequency as a Pap test.  Colorectal cancer can be detected and often prevented. Most routine colorectal cancer screening begins at the age of 66 years and continues through age 78 years. However, your health care provider may recommend screening at an earlier age if you have risk factors for colon cancer. On a yearly basis, your health care provider may provide home test kits to check for hidden blood in the stool. Use of a small camera at the end of a tube, to directly examine the colon (sigmoidoscopy or  colonoscopy), can detect the earliest forms of colorectal cancer. Talk to your health care provider about this at age 23, when routine screening begins. Direct exam of the colon should be repeated every 5-10 years through age 18 years, unless early forms of pre-cancerous polyps or small growths are found.  People who are at an increased risk for hepatitis B should be screened for this virus. You are considered at high risk for hepatitis B if:  You were born in a country where hepatitis B occurs often. Talk with your health care provider about which countries are considered high risk.  Your parents were born in a high-risk country and you have not received a shot to protect against hepatitis B (hepatitis B vaccine).  You have HIV or AIDS.  You use needles to inject street drugs.  You live with, or have sex with, someone who has hepatitis B.  You get hemodialysis treatment.  You take certain medicines for conditions like cancer, organ transplantation, and autoimmune conditions.  Hepatitis C blood testing is recommended for all people born from [birth year] through 1965 and any individual with known risks for hepatitis C.  Practice safe sex. Use condoms and avoid high-risk sexual practices to reduce the spread of sexually transmitted infections (STIs). STIs include gonorrhea, chlamydia, syphilis, trichomonas, herpes, HPV, and human immunodeficiency virus (HIV). Herpes, HIV, and HPV are viral illnesses that have no cure. They can result in disability, cancer, and death.  You should be screened for sexually transmitted illnesses (STIs) including gonorrhea and chlamydia if:  You are sexually active and are younger than 24 years.  You are older than 24 years and your health care provider tells you that you are at risk for this type of infection.  Your sexual activity has changed since you were last screened and you are at an increased risk for chlamydia or gonorrhea. Ask your health care provider if  you are at risk.  If you are at risk of being infected with HIV, it is recommended that you take a prescription medicine daily to prevent HIV infection. This is called preexposure prophylaxis (PrEP). You are considered at risk if:  You are a heterosexual woman, are sexually active, and are at increased risk for HIV infection.  You take drugs by injection.  You are sexually active with a partner who has HIV.  Talk with your health care provider about whether you are at high risk of being infected with HIV. If you choose to begin PrEP, you should first be tested for HIV. You should then be tested every  3 months for as long as you are taking PrEP.  Osteoporosis is a disease in which the bones lose minerals and strength with aging. This can result in serious bone fractures or breaks. The risk of osteoporosis can be identified using a bone density scan. Women ages 73 years and over and women at risk for fractures or osteoporosis should discuss screening with their health care providers. Ask your health care provider whether you should take a calcium supplement or vitamin D to reduce the rate of osteoporosis.  Menopause can be associated with physical symptoms and risks. Hormone replacement therapy is available to decrease symptoms and risks. You should talk to your health care provider about whether hormone replacement therapy is right for you.  Use sunscreen. Apply sunscreen liberally and repeatedly throughout the day. You should seek shade when your shadow is shorter than you. Protect yourself by wearing long sleeves, pants, a wide-brimmed hat, and sunglasses year round, whenever you are outdoors.  Once a month, do a whole body skin exam, using a mirror to look at the skin on your back. Tell your health care provider of new moles, moles that have irregular borders, moles that are larger than a pencil eraser, or moles that have changed in shape or color.  Stay current with required vaccines  (immunizations).  Influenza vaccine. All adults should be immunized every year.  Tetanus, diphtheria, and acellular pertussis (Td, Tdap) vaccine. Pregnant women should receive 1 dose of Tdap vaccine during each pregnancy. The dose should be obtained regardless of the length of time since the last dose. Immunization is preferred during the 27th-36th week of gestation. An adult who has not previously received Tdap or who does not know her vaccine status should receive 1 dose of Tdap. This initial dose should be followed by tetanus and diphtheria toxoids (Td) booster doses every 10 years. Adults with an unknown or incomplete history of completing a 3-dose immunization series with Td-containing vaccines should begin or complete a primary immunization series including a Tdap dose. Adults should receive a Td booster every 10 years.  Varicella vaccine. An adult without evidence of immunity to varicella should receive 2 doses or a second dose if she has previously received 1 dose. Pregnant females who do not have evidence of immunity should receive the first dose after pregnancy. This first dose should be obtained before leaving the health care facility. The second dose should be obtained 4-8 weeks after the first dose.  Human papillomavirus (HPV) vaccine. Females aged 13-26 years who have not received the vaccine previously should obtain the 3-dose series. The vaccine is not recommended for use in pregnant females. However, pregnancy testing is not needed before receiving a dose. If a female is found to be pregnant after receiving a dose, no treatment is needed. In that case, the remaining doses should be delayed until after the pregnancy. Immunization is recommended for any person with an immunocompromised condition through the age of 83 years if she did not get any or all doses earlier. During the 3-dose series, the second dose should be obtained 4-8 weeks after the first dose. The third dose should be obtained  24 weeks after the first dose and 16 weeks after the second dose.  Zoster vaccine. One dose is recommended for adults aged 47 years or older unless certain conditions are present.  Measles, mumps, and rubella (MMR) vaccine. Adults born before [birth year] generally are considered immune to measles and mumps. Adults born in [birth year] or later should have  1 or more doses of MMR vaccine unless there is a contraindication to the vaccine or there is laboratory evidence of immunity to each of the three diseases. A routine second dose of MMR vaccine should be obtained at least 28 days after the first dose for students attending postsecondary schools, health care workers, or international travelers. People who received inactivated measles vaccine or an unknown type of measles vaccine during 1963-1967 should receive 2 doses of MMR vaccine. People who received inactivated mumps vaccine or an unknown type of mumps vaccine before 1979 and are at high risk for mumps infection should consider immunization with 2 doses of MMR vaccine. For females of childbearing age, rubella immunity should be determined. If there is no evidence of immunity, females who are not pregnant should be vaccinated. If there is no evidence of immunity, females who are pregnant should delay immunization until after pregnancy. Unvaccinated health care workers born before [birth year] who lack laboratory evidence of measles, mumps, or rubella immunity or laboratory confirmation of disease should consider measles and mumps immunization with 2 doses of MMR vaccine or rubella immunization with 1 dose of MMR vaccine.  Pneumococcal 13-valent conjugate (PCV13) vaccine. When indicated, a person who is uncertain of her immunization history and has no record of immunization should receive the PCV13 vaccine. An adult aged 73 years or older who has certain medical conditions and has not been previously immunized should receive 1 dose of PCV13 vaccine. This PCV13 should be followed  with a dose of pneumococcal polysaccharide (PPSV23) vaccine. The PPSV23 vaccine dose should be obtained at least 8 weeks after the dose of PCV13 vaccine. An adult aged 80 years or older who has certain medical conditions and previously received 1 or more doses of PPSV23 vaccine should receive 1 dose of PCV13. The PCV13 vaccine dose should be obtained 1 or more years after the last PPSV23 vaccine dose.  Pneumococcal polysaccharide (PPSV23) vaccine. When PCV13 is also indicated, PCV13 should be obtained first. All adults aged 52 years and older should be immunized. An adult younger than age 68 years who has certain medical conditions should be immunized. Any person who resides in a nursing home or long-term care facility should be immunized. An adult smoker should be immunized. People with an immunocompromised condition and certain other conditions should receive both PCV13 and PPSV23 vaccines. People with human immunodeficiency virus (HIV) infection should be immunized as soon as possible after diagnosis. Immunization during chemotherapy or radiation therapy should be avoided. Routine use of PPSV23 vaccine is not recommended for American Indians, [place] Natives, or people younger than 65 years unless there are medical conditions that require PPSV23 vaccine. When indicated, people who have unknown immunization and have no record of immunization should receive PPSV23 vaccine. One-time revaccination 5 years after the first dose of PPSV23 is recommended for people aged 19-64 years who have chronic kidney failure, nephrotic syndrome, asplenia, or immunocompromised conditions. People who received 1-2 doses of PPSV23 before age 80 years should receive another dose of PPSV23 vaccine at age 1 years or later if at least 5 years have passed since the previous dose. Doses of PPSV23 are not needed for people immunized with PPSV23 at or after age 40 years.  Meningococcal vaccine. Adults with asplenia or persistent complement  component deficiencies should receive 2 doses of quadrivalent meningococcal conjugate (MenACWY-D) vaccine. The doses should be obtained at least 2 months apart. Microbiologists working with certain meningococcal bacteria, [employer] recruits, people at risk during an outbreak, and people  who travel to or live in countries with a high rate of meningitis should be immunized. A first-year college student up through age 80 years who is living in a residence hall should receive a dose if she did not receive a dose on or after her 16th birthday. Adults who have certain high-risk conditions should receive one or more doses of vaccine.  Hepatitis A vaccine. Adults who wish to be protected from this disease, have certain high-risk conditions, work with hepatitis A-infected animals, work in hepatitis A research labs, or travel to or work in countries with a high rate of hepatitis A should be immunized. Adults who were previously unvaccinated and who anticipate close contact with an international adoptee during the first 60 days after arrival in the [place] from a country with a high rate of hepatitis A should be immunized.  Hepatitis B vaccine. Adults who wish to be protected from this disease, have certain high-risk conditions, may be exposed to blood or other infectious body fluids, are household contacts or sex partners of hepatitis B positive people, are clients or workers in certain care facilities, or travel to or work in countries with a high rate of hepatitis B should be immunized.  Haemophilus influenzae type b (Hib) vaccine. A previously unvaccinated person with asplenia or sickle cell disease or having a scheduled splenectomy should receive 1 dose of Hib vaccine. Regardless of previous immunization, a recipient of a hematopoietic stem cell transplant should receive a 3-dose series 6-12 months after her successful transplant. Hib vaccine is not recommended for adults with HIV infection. Preventive  Services / Frequency Ages 105 to 59 years  Blood pressure check.** / Every 1 to 2 years.  Lipid and cholesterol check.** / Every 5 years beginning at age 81.  Clinical breast exam.** / Every 3 years for women in their 1s and 27s.  BRCA-related cancer risk assessment.** / For women who have family members with a BRCA-related cancer (breast, ovarian, tubal, or peritoneal cancers).  Pap test.** / Every 2 years from ages 25 through 14. Every 3 years starting at age 37 through age 67 or 5 with a history of 3 consecutive normal Pap tests.  HPV screening.** / Every 3 years from ages 34 through ages 41 to 40 with a history of 3 consecutive normal Pap tests.  Hepatitis C blood test.** / For any individual with known risks for hepatitis C.  Skin self-exam. / Monthly.  Influenza vaccine. / Every year.  Tetanus, diphtheria, and acellular pertussis (Tdap, Td) vaccine.** / Consult your health care provider. Pregnant women should receive 1 dose of Tdap vaccine during each pregnancy. 1 dose of Td every 10 years.  Varicella vaccine.** / Consult your health care provider. Pregnant females who do not have evidence of immunity should receive the first dose after pregnancy.  HPV vaccine. / 3 doses over 6 months, if 19 and younger. The vaccine is not recommended for use in pregnant females. However, pregnancy testing is not needed before receiving a dose.  Measles, mumps, rubella (MMR) vaccine.** / You need at least 1 dose of MMR if you were born in 1957 or later. You may also need a 2nd dose. For females of childbearing age, rubella immunity should be determined. If there is no evidence of immunity, females who are not pregnant should be vaccinated. If there is no evidence of immunity, females who are pregnant should delay immunization until after pregnancy.  Pneumococcal 13-valent conjugate (PCV13) vaccine.** / Consult your health care provider.  Pneumococcal polysaccharide (PPSV23) vaccine.** / 1 to 2  doses if you smoke cigarettes or if you have certain conditions.  Meningococcal vaccine.** / 1 dose if you are age 27 to 56 years and a Market researcher living in a residence hall, or have one of several medical conditions, you need to get vaccinated against meningococcal disease. You may also need additional booster doses.  Hepatitis A vaccine.** / Consult your health care provider.  Hepatitis B vaccine.** / Consult your health care provider.  Haemophilus influenzae type b (Hib) vaccine.** / Consult your health care provider. Ages 79 to 73 years  Blood pressure check.** / Every 1 to 2 years.  Lipid and cholesterol check.** / Every 5 years beginning at age 38 years.  Lung cancer screening. / Every year if you are aged 56-80 years and have a 30-pack-year history of smoking and currently smoke or have quit within the past 15 years. Yearly screening is stopped once you have quit smoking for at least 15 years or develop a health problem that would prevent you from having lung cancer treatment.  Clinical breast exam.** / Every year after age 35 years.  BRCA-related cancer risk assessment.** / For women who have family members with a BRCA-related cancer (breast, ovarian, tubal, or peritoneal cancers).  Mammogram.** / Every year beginning at age 41 years and continuing for as long as you are in good health. Consult with your health care provider.  Pap test.** / Every 3 years starting at age 49 years through age 9 or 1 years with a history of 3 consecutive normal Pap tests.  HPV screening.** / Every 3 years from ages 79 years through ages 45 to 75 years with a history of 3 consecutive normal Pap tests.  Fecal occult blood test (FOBT) of stool. / Every year beginning at age 77 years and continuing until age 79 years. You may not need to do this test if you get a colonoscopy every 10 years.  Flexible sigmoidoscopy or colonoscopy.** / Every 5 years for a flexible sigmoidoscopy or every  10 years for a colonoscopy beginning at age 76 years and continuing until age 37 years.  Hepatitis C blood test.** / For all people born from [birth year] through 1967 and any individual with known risks for hepatitis C.  Skin self-exam. / Monthly.  Influenza vaccine. / Every year.  Tetanus, diphtheria, and acellular pertussis (Tdap/Td) vaccine.** / Consult your health care provider. Pregnant women should receive 1 dose of Tdap vaccine during each pregnancy. 1 dose of Td every 10 years.  Varicella vaccine.** / Consult your health care provider. Pregnant females who do not have evidence of immunity should receive the first dose after pregnancy.  Zoster vaccine.** / 1 dose for adults aged 54 years or older.  Measles, mumps, rubella (MMR) vaccine.** / You need at least 1 dose of MMR if you were born in 1959 or later. You may also need a 2nd dose. For females of childbearing age, rubella immunity should be determined. If there is no evidence of immunity, females who are not pregnant should be vaccinated. If there is no evidence of immunity, females who are pregnant should delay immunization until after pregnancy.  Pneumococcal 13-valent conjugate (PCV13) vaccine.** / Consult your health care provider.  Pneumococcal polysaccharide (PPSV23) vaccine.** / 1 to 2 doses if you smoke cigarettes or if you have certain conditions.  Meningococcal vaccine.** / Consult your health care provider.  Hepatitis A vaccine.** / Consult your health care provider.  Hepatitis  B vaccine.** / Consult your health care provider.  Haemophilus influenzae type b (Hib) vaccine.** / Consult your health care provider. Ages 79 years and over  Blood pressure check.** / Every 1 to 2 years.  Lipid and cholesterol check.** / Every 5 years beginning at age 31 years.  Lung cancer screening. / Every year if you are aged 71-80 years and have a 30-pack-year history of smoking and currently smoke or have quit within the past 15 years.  Yearly screening is stopped once you have quit smoking for at least 15 years or develop a health problem that would prevent you from having lung cancer treatment.  Clinical breast exam.** / Every year after age 27 years.  BRCA-related cancer risk assessment.** / For women who have family members with a BRCA-related cancer (breast, ovarian, tubal, or peritoneal cancers).  Mammogram.** / Every year beginning at age 58 years and continuing for as long as you are in good health. Consult with your health care provider.  Pap test.** / Every 3 years starting at age 56 years through age 63 or 56 years with 3 consecutive normal Pap tests. Testing can be stopped between 65 and 70 years with 3 consecutive normal Pap tests and no abnormal Pap or HPV tests in the past 10 years.  HPV screening.** / Every 3 years from ages 50 years through ages 2 or 66 years with a history of 3 consecutive normal Pap tests. Testing can be stopped between 65 and 70 years with 3 consecutive normal Pap tests and no abnormal Pap or HPV tests in the past 10 years.  Fecal occult blood test (FOBT) of stool. / Every year beginning at age 4 years and continuing until age 51 years. You may not need to do this test if you get a colonoscopy every 10 years.  Flexible sigmoidoscopy or colonoscopy.** / Every 5 years for a flexible sigmoidoscopy or every 10 years for a colonoscopy beginning at age 63 years and continuing until age 36 years.  Hepatitis C blood test.** / For all people born from [birth year] through 1967 and any individual with known risks for hepatitis C.  Osteoporosis screening.** / A one-time screening for women ages 36 years and over and women at risk for fractures or osteoporosis.  Skin self-exam. / Monthly.  Influenza vaccine. / Every year.  Tetanus, diphtheria, and acellular pertussis (Tdap/Td) vaccine.** / 1 dose of Td every 10 years.  Varicella vaccine.** / Consult your health care provider.  Zoster vaccine.** / 1  dose for adults aged 36 years or older.  Pneumococcal 13-valent conjugate (PCV13) vaccine.** / Consult your health care provider.  Pneumococcal polysaccharide (PPSV23) vaccine.** / 1 dose for all adults aged 72 years and older.  Meningococcal vaccine.** / Consult your health care provider.  Hepatitis A vaccine.** / Consult your health care provider.  Hepatitis B vaccine.** / Consult your health care provider.  Haemophilus influenzae type b (Hib) vaccine.** / Consult your health care provider. ** Family history and personal history of risk and conditions may change your health care provider's recommendations. Document Released: 01/26/2002 Document Revised: 04/16/2014 Document Reviewed: 04/27/2011 [facility] Patient Information 2015 [place], [place]. This information is not intended to replace advice given to you by your health care provider. Make sure you discuss any questions you have with your health care provider.

## 2015-02-26 NOTE — Progress Notes (Signed)
Pre visit review using our clinic review tool, if applicable. No additional management support is needed unless otherwise documented below in the visit note. 

## 2015-02-26 NOTE — Telephone Encounter (Signed)
See speciality notes 

## 2015-02-27 ENCOUNTER — Other Ambulatory Visit (INDEPENDENT_AMBULATORY_CARE_PROVIDER_SITE_OTHER): Payer: BLUE CROSS/BLUE SHIELD

## 2015-02-27 DIAGNOSIS — E039 Hypothyroidism, unspecified: Secondary | ICD-10-CM

## 2015-02-27 DIAGNOSIS — E669 Obesity, unspecified: Secondary | ICD-10-CM

## 2015-02-27 DIAGNOSIS — E119 Type 2 diabetes mellitus without complications: Secondary | ICD-10-CM

## 2015-02-27 DIAGNOSIS — E1169 Type 2 diabetes mellitus with other specified complication: Secondary | ICD-10-CM

## 2015-02-27 LAB — TSH: TSH: 2.28 u[IU]/mL (ref 0.35–4.50)

## 2015-02-27 LAB — COMPREHENSIVE METABOLIC PANEL
ALT: 14 U/L (ref 0–35)
AST: 12 U/L (ref 0–37)
Albumin: 4.1 g/dL (ref 3.5–5.2)
Alkaline Phosphatase: 92 U/L (ref 39–117)
BILIRUBIN TOTAL: 0.5 mg/dL (ref 0.2–1.2)
BUN: 17 mg/dL (ref 6–23)
CALCIUM: 9.2 mg/dL (ref 8.4–10.5)
CHLORIDE: 104 meq/L (ref 96–112)
CO2: 31 meq/L (ref 19–32)
Creatinine, Ser: 0.78 mg/dL (ref 0.40–1.20)
GFR: 79.83 mL/min (ref >60.00)
Glucose, Bld: 143 mg/dL — ABNORMAL HIGH (ref 70–99)
POTASSIUM: 4.2 meq/L (ref 3.5–5.1)
SODIUM: 139 meq/L (ref 135–145)
TOTAL PROTEIN: 6.5 g/dL (ref 6.0–8.3)

## 2015-02-27 LAB — HEMOGLOBIN A1C: HEMOGLOBIN A1C: 6.6 % — AB (ref 4.6–6.5)

## 2015-02-27 LAB — LIPID PANEL
CHOLESTEROL: 150 mg/dL (ref 0–200)
HDL: 40.3 mg/dL (ref >39.00)
LDL Cholesterol: 94 mg/dL (ref 0–99)
NonHDL: 109.7
Total CHOL/HDL Ratio: 4
Triglycerides: 78 mg/dL (ref 0.0–149.0)
VLDL: 15.6 mg/dL (ref 0.0–40.0)

## 2015-02-27 LAB — CBC
HCT: 38.6 % (ref 36.0–46.0)
HEMOGLOBIN: 13.3 g/dL (ref 12.0–15.0)
MCHC: 34.5 g/dL (ref 30.0–36.0)
MCV: 87.6 fl (ref 78.0–100.0)
Platelets: 220 10*3/uL (ref 150.0–400.0)
RBC: 4.41 Mil/uL (ref 3.87–5.11)
RDW: 13 % (ref 11.5–15.5)
WBC: 5.7 10*3/uL (ref 4.0–10.5)

## 2015-03-03 ENCOUNTER — Encounter: Payer: Self-pay | Admitting: Family Medicine

## 2015-03-03 DIAGNOSIS — T7840XA Allergy, unspecified, initial encounter: Secondary | ICD-10-CM

## 2015-03-03 DIAGNOSIS — K5900 Constipation, unspecified: Secondary | ICD-10-CM | POA: Insufficient documentation

## 2015-03-03 DIAGNOSIS — I7300 Raynaud's syndrome without gangrene: Secondary | ICD-10-CM

## 2015-03-03 HISTORY — DX: Raynaud's syndrome without gangrene: I73.00

## 2015-03-03 HISTORY — DX: Constipation, unspecified: K59.00

## 2015-03-03 HISTORY — DX: Allergy, unspecified, initial encounter: T78.40XA

## 2015-03-03 NOTE — Assessment & Plan Note (Signed)
On Levothyroxine, continue to monitor 

## 2015-03-03 NOTE — Assessment & Plan Note (Signed)
No recent flares 

## 2015-03-03 NOTE — Assessment & Plan Note (Signed)
Recent worsening of cold feet and tingling fingers. Educated patient on syndrome and encouraged warm socks and gloves.

## 2015-03-03 NOTE — Assessment & Plan Note (Signed)
Patient encouraged to maintain heart healthy diet, regular exercise, adequate sleep. Consider daily probiotics. Take medications as prescribed. Annual labs ordered and reviewed.  

## 2015-03-03 NOTE — Assessment & Plan Note (Signed)
>>  ASSESSMENT AND PLAN FOR TYPE 2 DIABETES MELLITUS WITH OBESITY WRITTEN ON 03/03/2015  7:17 PM BY BLYTH, STACEY A, MD  hgba1c acceptable, minimize simple carbs. Increase exercise as tolerated. Continue current meds

## 2015-03-03 NOTE — Assessment & Plan Note (Signed)
Patient taking some supplements but does have some paresthesias. Will repeat vitamin B12 level

## 2015-03-03 NOTE — Progress Notes (Signed)
Tiffany Mckenzie  [id number] [birth year] 03/03/2015      Progress Note-Follow Up  Subjective  Chief Complaint  Chief Complaint  Patient presents with  . Annual Exam    HPI  Patient is a 59 y.o. female in today for routine medical care. Patient is in today for annual exam. Generally doing well but has had some recent trouble with allergies. Notes some nasal congestion and mild pruritus. No fevers or chills. No significant cough. Is also complaining of some tingling in bilateral hands intermittently without associated pain or injury. Has a long history of some cold feet but they've recently become more intense. No purple or pain associated. Denies CP/palp/SOB/HA/congestion/fevers/GI or GU c/o. Taking meds as prescribed  Past Medical History  Diagnosis Date  . Asthma   . Arthritis   . History of chicken pox   . Allergic rhinitis   . History of hepatitis     age 89  . Hypertension   . Hypothyroidism   . Hyperglycemia 09/13/2012  . Type II or unspecified type diabetes mellitus without mention of complication, uncontrolled 09/13/2012  . Overactive bladder 03/17/2013    Seeing urology  . Allergic state 03/03/2015  . Raynaud phenomenon 03/03/2015  . Diabetes mellitus type 2 in obese 09/13/2012  . Annual physical exam 02/09/2012    Past Surgical History  Procedure Laterality Date  . Tonsillectomy    . Abdominal hysterectomy  2002    Family History  Problem Relation Age of Onset  . Alcohol abuse Father   . Cancer Father     lung  . Diabetes Father   . Arthritis Mother   . Heart disease Mother     Blockage & double bypass  . Hypertension Mother   . Diabetes Mother   . Colon cancer Paternal Grandmother   . Stroke Paternal Grandmother     maternal grandfather  . Breast cancer Maternal Grandmother   . Heart disease Maternal Grandfather   . Scoliosis Daughter   . Cancer Paternal Grandfather     lung    History   Social History  . Marital Status: Married    Spouse Name: N/A  .  Number of Children: N/A  . Years of Education: N/A   Occupational History  . Not on file.   Social History Main Topics  . Smoking status: Never Smoker   . Smokeless tobacco: Never Used  . Alcohol Use: Yes     Comment: less than once a month, white wine or mixed drink  . Drug Use: No  . Sexual Activity: Not on file   Other Topics Concern  . Not on file   Social History Narrative    Current Outpatient Prescriptions on File Prior to Visit  Medication Sig Dispense Refill  . Blood Glucose Monitoring Suppl (FREESTYLE LITE) DEVI     . clotrimazole-betamethasone (LOTRISONE) cream Apply topically at bedtime. 45 g 1  . Cyanocobalamin (VITAMIN B 12 PO) Take 1,000 mcg by mouth daily.    Marland Kitchen. estradiol (ESTRACE) 1 MG tablet Take 1 mg by mouth daily.    Marland Kitchen. FREESTYLE LITE test strip CHECK BLOOD SUGAR ONCE DAILY AS NEEDED FOR SYMPTOMS 50 each 11  . glucose blood test strip Test Blood Sugar Once Daily As Needed For Sypmtoms    . ibuprofen (ADVIL,MOTRIN) 800 MG tablet Take 1 tablet (800 mg total) by mouth 3 (three) times daily. 21 tablet 0  . Lancets (FREESTYLE) lancets Test Blood Sugar Once Daily As Needed For Symptoms    .  LORazepam (ATIVAN) 0.5 MG tablet Take 0.5 mg by mouth 2 (two) times daily as needed.    Marland Kitchen PROTOPIC 0.1 % ointment      No current facility-administered medications on file prior to visit.    Allergies  Allergen Reactions  . Clarithromycin     Rash  . Codeine     vomiting    Review of Systems  Review of Systems  Constitutional: Negative for fever, chills and malaise/fatigue.  HENT: Positive for congestion. Negative for hearing loss and nosebleeds.   Eyes: Negative for discharge.  Respiratory: Positive for sputum production. Negative for cough, shortness of breath and wheezing.   Cardiovascular: Negative for chest pain, palpitations and leg swelling.  Gastrointestinal: Negative for heartburn, nausea, vomiting, abdominal pain, diarrhea, constipation and blood in stool.   Genitourinary: Negative for dysuria, urgency, frequency and hematuria.  Musculoskeletal: Negative for myalgias, back pain and falls.  Skin: Negative for rash.  Neurological: Positive for tingling. Negative for dizziness, tremors, sensory change, focal weakness, loss of consciousness, weakness and headaches.  Endo/Heme/Allergies: Negative for polydipsia. Does not bruise/bleed easily.  Psychiatric/Behavioral: Negative for depression and suicidal ideas. The patient is not nervous/anxious and does not have insomnia.     Objective  BP 110/64 mmHg  Pulse 84  Temp(Src) 98.6 F (37 C) (Oral)  Ht  (1.575 m)  Wt 173 lb 4 oz (78.586 kg)  BMI 31.68 kg/m2  SpO2 94%  Physical Exam  Physical Exam  Constitutional: She is oriented to person, place, and time and well-developed, well-nourished, and in no distress. No distress.  HENT:  Head: Normocephalic and atraumatic.  Right Ear: External ear normal.  Left Ear: External ear normal.  Nose: Nose normal.  Mouth/Throat: Oropharynx is clear and moist. No oropharyngeal exudate.  Eyes: Conjunctivae are normal. Pupils are equal, round, and reactive to light. Right eye exhibits no discharge. Left eye exhibits no discharge. No scleral icterus.  Neck: Normal range of motion. Neck supple. No thyromegaly present.  Cardiovascular: Normal rate, regular rhythm, normal heart sounds and intact distal pulses.   No murmur heard. Pulmonary/Chest: Effort normal and breath sounds normal. No respiratory distress. She has no wheezes. She has no rales.  Abdominal: Soft. Bowel sounds are normal. She exhibits no distension and no mass. There is no tenderness.  Musculoskeletal: Normal range of motion. She exhibits no edema or tenderness.  Lymphadenopathy:    She has no cervical adenopathy.  Neurological: She is alert and oriented to person, place, and time. She has normal reflexes. No cranial nerve deficit. Coordination normal.  Skin: Skin is warm and dry. No rash  noted. She is not diaphoretic.  Psychiatric: Mood, memory and affect normal.    Lab Results  Component Value Date   TSH 2.28 02/27/2015   Lab Results  Component Value Date   WBC 5.7 02/27/2015   HGB 13.3 02/27/2015   HCT 38.6 02/27/2015   MCV 87.6 02/27/2015   PLT 220.0 02/27/2015   Lab Results  Component Value Date   CREATININE 0.78 02/27/2015   BUN 17 02/27/2015   NA 139 02/27/2015   K 4.2 02/27/2015   CL 104 02/27/2015   CO2 31 02/27/2015   Lab Results  Component Value Date   ALT 14 02/27/2015   AST 12 02/27/2015   ALKPHOS 92 02/27/2015   BILITOT 0.5 02/27/2015   Lab Results  Component Value Date   CHOL 150 02/27/2015   Lab Results  Component Value Date   HDL 40.30 02/27/2015  Lab Results  Component Value Date   LDLCALC 94 02/27/2015   Lab Results  Component Value Date   TRIG 78.0 02/27/2015   Lab Results  Component Value Date   CHOLHDL 4 02/27/2015     Assessment & Plan  Asthma No recent flares    Hypothyroidism On Levothyroxine, continue to monitor   Allergic state Recent flare in symptoms, encouraged Claritin and Flonase daily   Raynaud phenomenon Recent worsening of cold feet and tingling fingers. Educated patient on syndrome and encouraged warm socks and gloves.   Diabetes mellitus type 2 in obese hgba1c acceptable, minimize simple carbs. Increase exercise as tolerated. Continue current meds   Annual physical exam Patient encouraged to maintain heart healthy diet, regular exercise, adequate sleep. Consider daily probiotics. Take medications as prescribed. Annual labs ordered and reviewed.    Constipation Mild. Encouraged increased hydration and fiber in diet. Daily probiotics. If bowels not moving can use MOM 2 tbls po in 4 oz of warm prune juice by mouth every 2-3 days. If no results then repeat in 4 hours with  Dulcolax suppository pr, may repeat again in 4 more hours as needed. Seek care if symptoms worsen. Consider daily  Miralax and/or Dulcolax if symptoms persist.    B12 deficiency Patient taking some supplements but does have some paresthesias. Will repeat vitamin B12 level

## 2015-03-03 NOTE — Assessment & Plan Note (Signed)
Recent flare in symptoms, encouraged Claritin and Flonase daily

## 2015-03-03 NOTE — Assessment & Plan Note (Signed)
hgba1c acceptable, minimize simple carbs. Increase exercise as tolerated. Continue current meds 

## 2015-03-03 NOTE — Assessment & Plan Note (Signed)
Mild. Encouraged increased hydration and fiber in diet. Daily probiotics. If bowels not moving can use MOM 2 tbls po in 4 oz of warm prune juice by mouth every 2-3 days. If no results then repeat in 4 hours with  Dulcolax suppository pr, may repeat again in 4 more hours as needed. Seek care if symptoms worsen. Consider daily Miralax and/or Dulcolax if symptoms persist.

## 2015-03-04 ENCOUNTER — Telehealth: Payer: Self-pay | Admitting: General Practice

## 2015-03-04 DIAGNOSIS — E538 Deficiency of other specified B group vitamins: Secondary | ICD-10-CM

## 2015-03-04 NOTE — Addendum Note (Signed)
Addended by: Jackson LatinoYLER, Keara Pagliarulo L on: 03/04/2015 03:01 PM   Modules accepted: Orders

## 2015-03-04 NOTE — Telephone Encounter (Signed)
-----   Message from Bradd CanaryStacey A Blyth, MD sent at 03/03/2015  7:23 PM EDT ----- When I was reviewing her chart I noted her Vitamin B12 was not repeated and was low last year. Please see if she is willing to come in some time in next few weeks and repeat a vitamin B12 level for vit B12 deficiency. Thanks Dr B

## 2015-03-04 NOTE — Telephone Encounter (Signed)
Called pt and lmovm to call office to schedule a lab appt in the next couple of weeks. Labs ordered.

## 2015-05-14 ENCOUNTER — Other Ambulatory Visit: Payer: Self-pay | Admitting: Family Medicine

## 2015-05-28 ENCOUNTER — Encounter: Payer: Self-pay | Admitting: Family Medicine

## 2015-06-08 ENCOUNTER — Other Ambulatory Visit: Payer: Self-pay | Admitting: Family Medicine

## 2015-06-10 ENCOUNTER — Other Ambulatory Visit: Payer: Self-pay

## 2015-08-29 ENCOUNTER — Ambulatory Visit (INDEPENDENT_AMBULATORY_CARE_PROVIDER_SITE_OTHER): Payer: BLUE CROSS/BLUE SHIELD | Admitting: Family Medicine

## 2015-08-29 ENCOUNTER — Encounter: Payer: Self-pay | Admitting: Family Medicine

## 2015-08-29 VITALS — BP 114/63 | HR 82 | Temp 98.6°F | Ht 62.0 in | Wt 175.6 lb

## 2015-08-29 DIAGNOSIS — K5900 Constipation, unspecified: Secondary | ICD-10-CM

## 2015-08-29 DIAGNOSIS — Z23 Encounter for immunization: Secondary | ICD-10-CM

## 2015-08-29 DIAGNOSIS — M549 Dorsalgia, unspecified: Secondary | ICD-10-CM

## 2015-08-29 DIAGNOSIS — E538 Deficiency of other specified B group vitamins: Secondary | ICD-10-CM

## 2015-08-29 DIAGNOSIS — I7300 Raynaud's syndrome without gangrene: Secondary | ICD-10-CM

## 2015-08-29 DIAGNOSIS — E039 Hypothyroidism, unspecified: Secondary | ICD-10-CM

## 2015-08-29 DIAGNOSIS — G8929 Other chronic pain: Secondary | ICD-10-CM

## 2015-08-29 DIAGNOSIS — E669 Obesity, unspecified: Secondary | ICD-10-CM

## 2015-08-29 DIAGNOSIS — E119 Type 2 diabetes mellitus without complications: Secondary | ICD-10-CM | POA: Diagnosis not present

## 2015-08-29 DIAGNOSIS — E1169 Type 2 diabetes mellitus with other specified complication: Secondary | ICD-10-CM

## 2015-08-29 MED ORDER — INFLUENZA VAC SPLIT QUAD 0.5 ML IM SUSY
0.5000 mL | PREFILLED_SYRINGE | Freq: Once | INTRAMUSCULAR | Status: AC
  Administered 2015-08-29: 0.5 mL via INTRAMUSCULAR

## 2015-08-29 NOTE — Patient Instructions (Addendum)
Hydrate with 64 oz of clear fluids Magnesium 200 mg daily Increase potassium in diet OTC Hyland's night time leg cramp medicine   Consider a bone density to check the strength of your bones   Leg Cramps Leg cramps that occur during exercise can be caused by poor circulation or dehydration. However, muscle cramps that occur at rest or during the night are usually not due to any serious medical problem. Heat cramps may cause muscle spasms during hot weather.  CAUSES There is no clear cause for muscle cramps. However, dehydration may be a factor for those who do not drink enough fluids and those who exercise in the heat. Imbalances in the level of sodium, potassium, calcium or magnesium in the muscle tissue may also be a factor. Some medications, such as water pills (diuretics), may cause loss of chemicals that the body needs (like sodium and potassium) and cause muscle cramps. TREATMENT   Make sure your diet has enough fluids and essential minerals for the muscle to work normally.  Avoid strenuous exercise for several days if you have been having frequent leg cramps.  Stretch and massage the cramped muscle for several minutes.  Some medicines may be helpful in some patients with night cramps. Only take over-the-counter or prescription medicines as directed by your caregiver. SEEK IMMEDIATE MEDICAL CARE IF:   Your leg cramps become worse.  Your foot becomes cold, numb, or blue. Document Released: 01/07/2005 Document Revised: 02/22/2012 Document Reviewed: 12/25/2008 [facility] Patient Information 2015 [place], [place]. This information is not intended to replace advice given to you by your health care provider. Make sure you discuss any questions you have with your health care provider.

## 2015-08-29 NOTE — Progress Notes (Signed)
Pre visit review using our clinic review tool, if applicable. No additional management support is needed unless otherwise documented below in the visit note. 

## 2015-09-02 ENCOUNTER — Other Ambulatory Visit (INDEPENDENT_AMBULATORY_CARE_PROVIDER_SITE_OTHER): Payer: BLUE CROSS/BLUE SHIELD

## 2015-09-02 ENCOUNTER — Telehealth: Payer: Self-pay | Admitting: Family Medicine

## 2015-09-02 DIAGNOSIS — E669 Obesity, unspecified: Secondary | ICD-10-CM | POA: Diagnosis not present

## 2015-09-02 DIAGNOSIS — Z23 Encounter for immunization: Secondary | ICD-10-CM

## 2015-09-02 DIAGNOSIS — I7300 Raynaud's syndrome without gangrene: Secondary | ICD-10-CM

## 2015-09-02 DIAGNOSIS — E119 Type 2 diabetes mellitus without complications: Secondary | ICD-10-CM

## 2015-09-02 DIAGNOSIS — E1169 Type 2 diabetes mellitus with other specified complication: Secondary | ICD-10-CM

## 2015-09-02 DIAGNOSIS — M549 Dorsalgia, unspecified: Secondary | ICD-10-CM

## 2015-09-02 DIAGNOSIS — E039 Hypothyroidism, unspecified: Secondary | ICD-10-CM

## 2015-09-02 DIAGNOSIS — E538 Deficiency of other specified B group vitamins: Secondary | ICD-10-CM | POA: Diagnosis not present

## 2015-09-02 DIAGNOSIS — G8929 Other chronic pain: Secondary | ICD-10-CM

## 2015-09-02 LAB — LIPID PANEL
CHOL/HDL RATIO: 3
Cholesterol: 137 mg/dL (ref 0–200)
HDL: 39.4 mg/dL (ref >39.00)
LDL CALC: 87 mg/dL (ref 0–99)
NonHDL: 98.03
TRIGLYCERIDES: 54 mg/dL (ref 0.0–149.0)
VLDL: 10.8 mg/dL (ref 0.0–40.0)

## 2015-09-02 LAB — COMPREHENSIVE METABOLIC PANEL
ALT: 17 U/L (ref 0–35)
AST: 13 U/L (ref 0–37)
Albumin: 4 g/dL (ref 3.5–5.2)
Alkaline Phosphatase: 94 U/L (ref 39–117)
BUN: 15 mg/dL (ref 6–23)
CALCIUM: 9 mg/dL (ref 8.4–10.5)
CHLORIDE: 105 meq/L (ref 96–112)
CO2: 29 mEq/L (ref 19–32)
Creatinine, Ser: 0.67 mg/dL (ref 0.40–1.20)
GFR: 94.98 mL/min (ref >60.00)
Glucose, Bld: 167 mg/dL — ABNORMAL HIGH (ref 70–99)
Potassium: 4 mEq/L (ref 3.5–5.1)
Sodium: 141 mEq/L (ref 135–145)
Total Bilirubin: 0.5 mg/dL (ref 0.2–1.2)
Total Protein: 6.7 g/dL (ref 6.0–8.3)

## 2015-09-02 LAB — VITAMIN B12: Vitamin B-12: 456 pg/mL (ref 211–911)

## 2015-09-02 LAB — CBC
HCT: 39.4 % (ref 36.0–46.0)
HEMOGLOBIN: 13.3 g/dL (ref 12.0–15.0)
MCHC: 33.8 g/dL (ref 30.0–36.0)
MCV: 89.8 fl (ref 78.0–100.0)
PLATELETS: 217 10*3/uL (ref 150.0–400.0)
RBC: 4.38 Mil/uL (ref 3.87–5.11)
RDW: 13.1 % (ref 11.5–15.5)
WBC: 5.6 10*3/uL (ref 4.0–10.5)

## 2015-09-02 LAB — MICROALBUMIN / CREATININE URINE RATIO
Creatinine,U: 166.6 mg/dL
MICROALB/CREAT RATIO: 0.4 mg/g (ref 0.0–30.0)

## 2015-09-02 LAB — TSH: TSH: 0.81 u[IU]/mL (ref 0.35–4.50)

## 2015-09-02 LAB — HEMOGLOBIN A1C: HEMOGLOBIN A1C: 6.8 % — AB (ref 4.6–6.5)

## 2015-09-02 NOTE — Telephone Encounter (Signed)
She is certainly eligible for the Zostavax and a good candidate, I recommend she take one here or we can send a prescription to the pharmacy it just has to be separated from any pneumonia vaccine by 30 days

## 2015-09-02 NOTE — Telephone Encounter (Signed)
Patient informed of PCP instructions.  She will decide which to do that would work better for her schedule.

## 2015-09-02 NOTE — Telephone Encounter (Signed)
Pt came in for labs but had questions about getting a shingles vac. She says that it was discussed in her previous visit.   Please advise pt.    CB#: [phone]

## 2015-09-08 ENCOUNTER — Encounter: Payer: Self-pay | Admitting: Family Medicine

## 2015-09-08 NOTE — Progress Notes (Signed)
Subjective:    Patient ID: Tiffany Mckenzie, female    DOB: 1956-06-29, 59 y.o.   MRN: [id number]  Chief Complaint  Patient presents with  . Follow-up    HPI Patient is in today for follow-up. Generally doing well. Is complaining of some worsening cramping and pain in hands and feet with cold exposure. No falls or injury. Ongoing back pain is stable. No incontinence, GI or GU concerns noted. Denies CP/palp/SOB/HA/congestion/fevers/GI or GU c/o. Taking meds as prescribed  Past Medical History  Diagnosis Date  . Asthma   . Arthritis   . History of chicken pox   . Allergic rhinitis   . History of hepatitis     age 63  . Hypertension   . Hypothyroidism   . Hyperglycemia 09/13/2012  . Type II or unspecified type diabetes mellitus without mention of complication, uncontrolled 09/13/2012  . Overactive bladder 03/17/2013    Seeing urology  . Allergic state 03/03/2015  . Raynaud phenomenon 03/03/2015  . Diabetes mellitus type 2 in obese 09/13/2012  . Annual physical exam 02/09/2012  . Constipation 03/03/2015    Past Surgical History  Procedure Laterality Date  . Tonsillectomy    . Abdominal hysterectomy  2002    Family History  Problem Relation Age of Onset  . Alcohol abuse Father   . Cancer Father     lung  . Diabetes Father   . Arthritis Mother   . Heart disease Mother     Blockage & double bypass  . Hypertension Mother   . Diabetes Mother   . Colon cancer Paternal Grandmother   . Stroke Paternal Grandmother     maternal grandfather  . Breast cancer Maternal Grandmother   . Heart disease Maternal Grandfather   . Scoliosis Daughter   . Cancer Paternal Grandfather     lung    Social History   Social History  . Marital Status: Married    Spouse Name: N/A  . Number of Children: N/A  . Years of Education: N/A   Occupational History  . Not on file.   Social History Main Topics  . Smoking status: Never Smoker   . Smokeless tobacco: Never Used  . Alcohol Use: Yes   Comment: less than once a month, white wine or mixed drink  . Drug Use: No  . Sexual Activity: Not on file   Other Topics Concern  . Not on file   Social History Narrative    Outpatient Prescriptions Prior to Visit  Medication Sig Dispense Refill  . Blood Glucose Monitoring Suppl (FREESTYLE LITE) DEVI     . clotrimazole-betamethasone (LOTRISONE) cream Apply topically at bedtime. 45 g 1  . Cyanocobalamin (VITAMIN B 12 PO) Take 1,000 mcg by mouth daily.    Marland Kitchen estradiol (ESTRACE) 1 MG tablet Take 1 mg by mouth daily.    Marland Kitchen FREESTYLE LITE test strip CHECK BLOOD SUGAR ONCE DAILY AS NEEDED FOR SYMPTOMS 50 each 11  . glucose blood test strip Test Blood Sugar Once Daily As Needed For Sypmtoms    . ibuprofen (ADVIL,MOTRIN) 800 MG tablet Take 1 tablet (800 mg total) by mouth 3 (three) times daily. 21 tablet 0  . Lancets (FREESTYLE) lancets Test Blood Sugar Once Daily As Needed For Symptoms    . levothyroxine (SYNTHROID, LEVOTHROID) 100 MCG tablet Take 1 tablet (100 mcg total) by mouth daily before breakfast. 90 tablet 3  . levothyroxine (SYNTHROID, LEVOTHROID) 100 MCG tablet TAKE 1 TABLET DAILY BEFORE BREAKFAST 90 tablet 1  .  LORazepam (ATIVAN) 0.5 MG tablet Take 0.5 mg by mouth 2 (two) times daily as needed.    . metFORMIN (GLUCOPHAGE) 500 MG tablet Take 1 tablet (500 mg total) by mouth 2 (two) times daily with a meal. 60 tablet 6  . neomycin-polymyxin-hydrocortisone (CORTISPORIN) 3.5-10000-1 otic suspension PLACE 3 DROPS INTO BOTH EARS 3 (THREE) TIMES DAILY. 10 mL 0  . olmesartan (BENICAR) 20 MG tablet Take 0.5 tablets (10 mg total) by mouth daily. 45 tablet 3  . PROTOPIC 0.1 % ointment      No facility-administered medications prior to visit.    Allergies  Allergen Reactions  . Clarithromycin     Rash  . Codeine     vomiting    Review of Systems  Constitutional: Negative for fever and malaise/fatigue.  HENT: Negative for congestion.   Eyes: Negative for discharge.  Respiratory:  Negative for shortness of breath.   Cardiovascular: Negative for chest pain, palpitations and leg swelling.  Gastrointestinal: Negative for nausea and abdominal pain.  Genitourinary: Negative for dysuria.  Musculoskeletal: Positive for myalgias and back pain. Negative for falls.  Skin: Negative for rash.  Neurological: Negative for loss of consciousness and headaches.  Endo/Heme/Allergies: Negative for environmental allergies.  Psychiatric/Behavioral: Negative for depression. The patient is not nervous/anxious.        Objective:    Physical Exam  Constitutional: She is oriented to person, place, and time. She appears well-developed and well-nourished. No distress.  HENT:  Head: Normocephalic and atraumatic.  Nose: Nose normal.  Eyes: Right eye exhibits no discharge. Left eye exhibits no discharge.  Neck: Normal range of motion. Neck supple.  Cardiovascular: Normal rate and regular rhythm.   No murmur heard. Pulmonary/Chest: Effort normal and breath sounds normal.  Abdominal: Soft. Bowel sounds are normal. There is no tenderness.  Musculoskeletal: She exhibits no edema.  Neurological: She is alert and oriented to person, place, and time.  Skin: Skin is warm and dry.  Psychiatric: She has a normal mood and affect.  Nursing note and vitals reviewed.   BP 114/63 mmHg  Pulse 82  Temp(Src) 98.6 F (37 C) (Oral)  Ht  (1.575 m)  Wt 175 lb 9.6 oz (79.652 kg)  BMI 32.11 kg/m2  SpO2 97% Wt Readings from Last 3 Encounters:  08/29/15 175 lb 9.6 oz (79.652 kg)  02/26/15 173 lb 4 oz (78.586 kg)  12/04/14 168 lb (76.204 kg)     Lab Results  Component Value Date   WBC 5.6 09/02/2015   HGB 13.3 09/02/2015   HCT 39.4 09/02/2015   PLT 217.0 09/02/2015   GLUCOSE 167* 09/02/2015   CHOL 137 09/02/2015   TRIG 54.0 09/02/2015   HDL 39.40 09/02/2015   LDLCALC 87 09/02/2015   ALT 17 09/02/2015   AST 13 09/02/2015   NA 141 09/02/2015   K 4.0 09/02/2015   CL 105 09/02/2015    CREATININE 0.67 09/02/2015   BUN 15 09/02/2015   CO2 29 09/02/2015   TSH 0.81 09/02/2015   HGBA1C 6.8* 09/02/2015   MICROALBUR <0.7 09/02/2015    Lab Results  Component Value Date   TSH 0.81 09/02/2015   Lab Results  Component Value Date   WBC 5.6 09/02/2015   HGB 13.3 09/02/2015   HCT 39.4 09/02/2015   MCV 89.8 09/02/2015   PLT 217.0 09/02/2015   Lab Results  Component Value Date   NA 141 09/02/2015   K 4.0 09/02/2015   CO2 29 09/02/2015   GLUCOSE 167* 09/02/2015  BUN 15 09/02/2015   CREATININE 0.67 09/02/2015   BILITOT 0.5 09/02/2015   ALKPHOS 94 09/02/2015   AST 13 09/02/2015   ALT 17 09/02/2015   PROT 6.7 09/02/2015   ALBUMIN 4.0 09/02/2015   CALCIUM 9.0 09/02/2015   GFR 94.98 09/02/2015   Lab Results  Component Value Date   CHOL 137 09/02/2015   Lab Results  Component Value Date   HDL 39.40 09/02/2015   Lab Results  Component Value Date   LDLCALC 87 09/02/2015   Lab Results  Component Value Date   TRIG 54.0 09/02/2015   Lab Results  Component Value Date   CHOLHDL 3 09/02/2015   Lab Results  Component Value Date   HGBA1C 6.8* 09/02/2015       Assessment & Plan:   Problem List Items Addressed This Visit    Raynaud phenomenon    Notes pain and cramping in feet and hands when cold.       Relevant Orders   CBC (Completed)   TSH (Completed)   Lipid panel (Completed)   Comprehensive metabolic panel (Completed)   Vitamin B12 (Completed)   Hemoglobin A1c (Completed)   TSH   CBC   Hemoglobin A1c   Lipid panel   Comprehensive metabolic panel   Vitamin B12   Hypothyroidism    On Levothyroxine, continue to monitor      Relevant Orders   CBC (Completed)   TSH (Completed)   Lipid panel (Completed)   Comprehensive metabolic panel (Completed)   Vitamin B12 (Completed)   Hemoglobin A1c (Completed)   TSH   CBC   Hemoglobin A1c   Lipid panel   Comprehensive metabolic panel   Vitamin B12   Diabetes mellitus type 2 in obese (Chronic)     hgba1c acceptable, minimize simple carbs. Increase exercise as tolerated. Continue current meds. Given flu shot today. Sees [facility]      Relevant Orders   CBC (Completed)   TSH (Completed)   Lipid panel (Completed)   Comprehensive metabolic panel (Completed)   Vitamin B12 (Completed)   Hemoglobin A1c (Completed)   TSH   CBC   Hemoglobin A1c   Lipid panel   Comprehensive metabolic panel   Vitamin B12   Microalbumin / creatinine urine ratio (Completed)   Constipation    Encouraged increased hydration and fiber in diet. Daily probiotics. If bowels not moving can use MOM 2 tbls po in 4 oz of warm prune juice by mouth every 2-3 days. If no results then repeat in 4 hours with  Dulcolax suppository pr, may repeat again in 4 more hours as needed. Seek care if symptoms worsen. Consider daily Miralax and/or Dulcolax if symptoms persist.       Chronic back pain   Relevant Orders   CBC (Completed)   TSH (Completed)   Lipid panel (Completed)   Comprehensive metabolic panel (Completed)   Vitamin B12 (Completed)   Hemoglobin A1c (Completed)   TSH   CBC   Hemoglobin A1c   Lipid panel   Comprehensive metabolic panel   Vitamin B12   B12 deficiency   Relevant Orders   CBC (Completed)   TSH (Completed)   Lipid panel (Completed)   Comprehensive metabolic panel (Completed)   Vitamin B12 (Completed)   Hemoglobin A1c (Completed)   TSH   CBC   Hemoglobin A1c   Lipid panel   Comprehensive metabolic panel   Vitamin B12    Other Visit Diagnoses    Need for prophylactic vaccination  and inoculation against influenza    -  Primary    Relevant Medications    Influenza vac split quadrivalent PF (FLUARIX) injection 0.5 mL (Completed)    Other Relevant Orders    CBC (Completed)    TSH (Completed)    Lipid panel (Completed)    Comprehensive metabolic panel (Completed)    Vitamin B12 (Completed)    Hemoglobin A1c (Completed)    TSH    CBC    Hemoglobin A1c    Lipid  panel    Comprehensive metabolic panel    Vitamin B12       I am having Ms. Easterwood maintain her estradiol, LORazepam, Cyanocobalamin (VITAMIN B 12 PO), clotrimazole-betamethasone, FREESTYLE LITE, PROTOPIC, freestyle, glucose blood, FREESTYLE LITE, ibuprofen, levothyroxine, olmesartan, metFORMIN, levothyroxine, and neomycin-polymyxin-hydrocortisone. We administered Influenza vac split quadrivalent PF.  Meds ordered this encounter  Medications  . Influenza vac split quadrivalent PF (FLUARIX) injection 0.5 mL    Sig:      Danise Edge, MD

## 2015-09-08 NOTE — Assessment & Plan Note (Signed)
On Levothyroxine, continue to monitor 

## 2015-09-08 NOTE — Assessment & Plan Note (Signed)
Notes pain and cramping in feet and hands when cold.

## 2015-09-08 NOTE — Assessment & Plan Note (Addendum)
hgba1c acceptable, minimize simple carbs. Increase exercise as tolerated. Continue current meds. Given flu shot today. Sees [facility]

## 2015-09-08 NOTE — Assessment & Plan Note (Signed)
>>  ASSESSMENT AND PLAN FOR TYPE 2 DIABETES MELLITUS WITH OBESITY WRITTEN ON 09/08/2015  8:29 PM BY BLYTH, STACEY A, MD  hgba1c acceptable, minimize simple carbs. Increase exercise as tolerated. Continue current meds. Given flu shot today. Sees [facility]

## 2015-09-08 NOTE — Assessment & Plan Note (Signed)
Encouraged increased hydration and fiber in diet. Daily probiotics. If bowels not moving can use MOM 2 tbls po in 4 oz of warm prune juice by mouth every 2-3 days. If no results then repeat in 4 hours with  Dulcolax suppository pr, may repeat again in 4 more hours as needed. Seek care if symptoms worsen. Consider daily Miralax and/or Dulcolax if symptoms persist.  

## 2015-10-07 ENCOUNTER — Other Ambulatory Visit: Payer: Self-pay | Admitting: Family Medicine

## 2015-11-03 ENCOUNTER — Other Ambulatory Visit: Payer: Self-pay | Admitting: Family Medicine

## 2015-11-11 ENCOUNTER — Other Ambulatory Visit: Payer: Self-pay | Admitting: Family Medicine

## 2016-01-15 ENCOUNTER — Ambulatory Visit (INDEPENDENT_AMBULATORY_CARE_PROVIDER_SITE_OTHER): Payer: BLUE CROSS/BLUE SHIELD | Admitting: Internal Medicine

## 2016-01-15 ENCOUNTER — Encounter: Payer: Self-pay | Admitting: Internal Medicine

## 2016-01-15 VITALS — BP 108/66 | HR 94 | Temp 98.4°F | Ht 62.0 in | Wt 173.1 lb

## 2016-01-15 DIAGNOSIS — J4521 Mild intermittent asthma with (acute) exacerbation: Secondary | ICD-10-CM

## 2016-01-15 MED ORDER — DOXYCYCLINE HYCLATE 100 MG PO CAPS: 100.0000 mg | ORAL_CAPSULE | Freq: Two times a day (BID) | ORAL | Status: DC

## 2016-01-15 MED ORDER — ALBUTEROL SULFATE HFA 108 (90 BASE) MCG/ACT IN AERS: 2.0000 | INHALATION_SPRAY | Freq: Four times a day (QID) | RESPIRATORY_TRACT | Status: DC | PRN

## 2016-01-15 NOTE — Progress Notes (Signed)
Subjective:    Patient ID: Tiffany Mckenzie, female    DOB: 1956-11-14, 60 y.o.   MRN: [id number]  DOS:  01/15/2016 Type of visit - description : Acute visit Interval history: Symptoms started 4 days ago with cough, frontal headache, malaise. She has a history of asthma but does not have a inhaler  But feel she needed it. Not taking any OTCs   Review of Systems  + Subjective fever 1 No sinus pain or congestion No aches or pains. No nausea vomiting Minimal sputum production, no wheezing per se  Past Medical History  Diagnosis Date  . Asthma   . Arthritis   . History of chicken pox   . Allergic rhinitis   . History of hepatitis     age 62  . Hypertension   . Hypothyroidism   . Hyperglycemia 09/13/2012  . Type II or unspecified type diabetes mellitus without mention of complication, uncontrolled 09/13/2012  . Overactive bladder 03/17/2013    Seeing urology  . Allergic state 03/03/2015  . Raynaud phenomenon 03/03/2015  . Diabetes mellitus type 2 in obese ([facility]) 09/13/2012  . Annual physical exam 02/09/2012  . Constipation 03/03/2015    Past Surgical History  Procedure Laterality Date  . Tonsillectomy    . Abdominal hysterectomy  2002    Social History   Social History  . Marital Status: Married    Spouse Name: N/A  . Number of Children: N/A  . Years of Education: N/A   Occupational History  . Not on file.   Social History Main Topics  . Smoking status: Never Smoker   . Smokeless tobacco: Never Used  . Alcohol Use: Yes     Comment: less than once a month, white wine or mixed drink  . Drug Use: No  . Sexual Activity: Not on file   Other Topics Concern  . Not on file   Social History Narrative        Medication List       This list is accurate as of: 01/15/16 11:59 PM.  Always use your most recent med list.               albuterol 108 (90 Base) MCG/ACT inhaler  Commonly known as:  VENTOLIN HFA  Inhale 2 puffs into the lungs every 6 (six) hours as needed  for wheezing or shortness of breath.     doxycycline 100 MG capsule  Commonly known as:  VIBRAMYCIN  Take 1 capsule (100 mg total) by mouth 2 (two) times daily.     estradiol 1 MG tablet  Commonly known as:  ESTRACE  Take 1 mg by mouth daily.     freestyle lancets  Reported on 01/15/2016     FREESTYLE LITE Devi  Reported on 01/15/2016     glucose blood test strip  Reported on 01/15/2016     FREESTYLE LITE test strip  Generic drug:  glucose blood  CHECK BLOOD SUGAR ONCE DAILY AS NEEDED FOR SYMPTOMS     ibuprofen 800 MG tablet  Commonly known as:  ADVIL,MOTRIN  Take 1 tablet (800 mg total) by mouth 3 (three) times daily.     levothyroxine 100 MCG tablet  Commonly known as:  SYNTHROID, LEVOTHROID  Take 1 tablet (100 mcg total) by mouth daily before breakfast.     LORazepam 0.5 MG tablet  Commonly known as:  ATIVAN  Take 0.5 mg by mouth 2 (two) times daily as needed.     metFORMIN 500  MG tablet  Commonly known as:  GLUCOPHAGE  TAKE 1 TABLET (500 MG TOTAL) BY MOUTH 2 (TWO) TIMES DAILY WITH A MEAL.     olmesartan 20 MG tablet  Commonly known as:  BENICAR  Take 0.5 tablets (10 mg total) by mouth daily.     VITAMIN B 12 PO  Take 1,000 mcg by mouth daily.           Objective:   Physical Exam BP 108/66 mmHg  Pulse 94  Temp(Src) 98.4 F (36.9 C) (Oral)  Ht  (1.575 m)  Wt 173 lb 2 oz (78.529 kg)  BMI 31.66 kg/m2  SpO2 97% General:   Well developed, well nourished . NAD.  HEENT:  Normocephalic . Face symmetric, atraumatic. Nose not congested, sinuses not tachypnea, throat symmetric. Lungs:  CTA B but has prolonged expiratory time and few end expiratory wheezes. Freq Cough noted. Normal respiratory effort, no intercostal retractions, no accessory muscle use. Heart: RRR,  no murmur.  No pretibial edema bilaterally  Skin: Not pale. Not jaundice Neurologic:  alert & oriented X3.  Speech normal, gait appropriate for age and unassisted Psych--  Cognition and  judgment appear intact.  Cooperative with normal attention span and concentration.  Behavior appropriate. No anxious or depressed appearing.      Assessment & Plan:   60 year old lady with history of HTN, DM, hypothyroidism, allergies, asthma, Raynaud phenomenon presents today with a mild asthma exacerbation. See  instructions

## 2016-01-15 NOTE — Progress Notes (Signed)
Pre visit review using our clinic review tool, if applicable. No additional management support is needed unless otherwise documented below in the visit note. 

## 2016-01-15 NOTE — Patient Instructions (Signed)
Rest, fluids , tylenol  For cough:  Take Mucinex DM twice a day as needed until better If the cough is severe or you hear wheezing, use albuterol as needed  If  nasal congestion: Use OTC Nasocort or Flonase : 2 nasal sprays on each side of the nose in the morning until you feel better   Avoid decongestants such as  Pseudoephedrine or phenylephrine    Take the antibiotic as prescribed  (doxy) only if no better in 3-4 days   Call if not gradually better over the next  10 days  Call anytime if the symptoms are severe

## 2016-02-10 ENCOUNTER — Ambulatory Visit (INDEPENDENT_AMBULATORY_CARE_PROVIDER_SITE_OTHER): Payer: BLUE CROSS/BLUE SHIELD | Admitting: Family Medicine

## 2016-02-10 ENCOUNTER — Encounter: Payer: Self-pay | Admitting: Family Medicine

## 2016-02-10 VITALS — BP 132/77 | HR 113 | Temp 98.8°F | Resp 16 | Ht 62.0 in | Wt 173.2 lb

## 2016-02-10 DIAGNOSIS — J209 Acute bronchitis, unspecified: Secondary | ICD-10-CM | POA: Diagnosis not present

## 2016-02-10 DIAGNOSIS — J019 Acute sinusitis, unspecified: Secondary | ICD-10-CM

## 2016-02-10 MED ORDER — AZITHROMYCIN 250 MG PO TABS
ORAL_TABLET | ORAL | Status: DC
Start: 2016-02-10 — End: 2016-03-10

## 2016-02-10 MED ORDER — TRIAMCINOLONE ACETONIDE 0.1 % EX CREA: 1.0000 "application " | TOPICAL_CREAM | Freq: Two times a day (BID) | CUTANEOUS | Status: DC

## 2016-02-10 MED ORDER — METHYLPREDNISOLONE ACETATE 40 MG/ML IJ SUSP
40.0000 mg | Freq: Once | INTRAMUSCULAR | Status: AC
  Administered 2016-02-10: 40 mg via INTRAMUSCULAR

## 2016-02-10 MED ORDER — METHYLPREDNISOLONE ACETATE 40 MG/ML IJ SUSP: 40.0000 mg | Freq: Once | INTRAMUSCULAR | Status: DC

## 2016-02-10 MED ORDER — HYDROCODONE-HOMATROPINE 5-1.5 MG/5ML PO SYRP: 5.0000 mL | ORAL_SOLUTION | Freq: Three times a day (TID) | ORAL | Status: DC | PRN

## 2016-02-10 NOTE — Patient Instructions (Signed)

## 2016-02-10 NOTE — Progress Notes (Signed)
Pre visit review using our clinic review tool, if applicable. No additional management support is needed unless otherwise documented below in the visit note. 

## 2016-02-21 ENCOUNTER — Other Ambulatory Visit: Payer: Self-pay | Admitting: Family Medicine

## 2016-02-23 NOTE — Progress Notes (Signed)
  Subjective:     Tiffany Mckenzie is a 60 y.o. female who presents for evaluation of symptoms of a URI. Symptoms include bilateral ear pressure/pain. Onset of symptoms was 1 month ago, and has been gradually worsening since that time. Treatment to date: cough suppressants. Notes sputum production is yellow, has head and chest congestion, sore throat, malaise and fatigue. Myalgias noted with SOB and CP with coughing. No GI complaints The following portions of the patient's history were reviewed and updated as appropriate: allergies, current medications, past family history, past medical history, past social history, past surgical history and problem list.  Review of Systems Pertinent items are noted in HPI.   Objective:    BP 132/77 mmHg  Pulse 113  Temp(Src) 98.8 F (37.1 C) (Oral)  Resp 16  Ht 5\' 2"  (1.575 m)  Wt 173 lb 3.2 oz (78.563 kg)  BMI 31.67 kg/m2  SpO2 97%  General Appearance:    Alert, cooperative, no distress, appears stated age  Head:    Normocephalic, without obvious abnormality, atraumatic  Eyes:    PERRL, conjunctiva/corneas clear, EOM's intact, fundi    benign, both eyes  Ears:    Normal TM's and external ear canals, both ears  Nose:   Nares normal, septum midline, mucosa normal, no drainage    or sinus tenderness  Throat:   Lips, mucosa, and tongue normal; teeth and gums normal  Neck:   Supple, symmetrical, trachea midline, no adenopathy;    thyroid:  no enlargement/tenderness/nodules; no carotid   bruit or JVD  Back:     Symmetric, no curvature, ROM normal, no CVA tenderness  Lungs:     Clear to auscultation bilaterally, respirations unlabored  Chest Wall:    No tenderness or deformity   Heart:    Regular rate and rhythm, S1 and S2 normal, no murmur, rub   or gallop  Breast Exam:    No tenderness, masses, or nipple abnormality  Abdomen:     Soft, non-tender, bowel sounds active all four quadrants,    no masses, no organomegaly  Genitalia:    Normal female without  lesion, discharge or tenderness  Rectal:    Normal tone, normal prostate, no masses or tenderness;   guaiac negative stool  Extremities:   Extremities normal, atraumatic, no cyanosis or edema  Pulses:   2+ and symmetric all extremities  Skin:   Skin color, texture, turgor normal, no rashes or lesions  Lymph nodes:   Cervical, supraclavicular, and axillary nodes normal  Neurologic:   CNII-XII intact, normal strength, sensation and reflexes    throughout     Assessment:    sinusitis and viral upper respiratory illness  developing bronchitis as well  Plan:    Discussed diagnosis and treatment of URI. Discussed the diagnosis and treatment of sinusitis. Suggested symptomatic OTC remedies. Nasal saline spray for congestion. Follow up as needed. , Azithromycin

## 2016-03-03 ENCOUNTER — Other Ambulatory Visit: Payer: Self-pay | Admitting: Family Medicine

## 2016-03-03 ENCOUNTER — Other Ambulatory Visit (INDEPENDENT_AMBULATORY_CARE_PROVIDER_SITE_OTHER): Payer: BLUE CROSS/BLUE SHIELD

## 2016-03-03 DIAGNOSIS — E538 Deficiency of other specified B group vitamins: Secondary | ICD-10-CM

## 2016-03-03 LAB — VITAMIN B12: VITAMIN B 12: 419 pg/mL (ref 211–911)

## 2016-03-09 ENCOUNTER — Telehealth: Payer: Self-pay | Admitting: *Deleted

## 2016-03-09 NOTE — Telephone Encounter (Signed)
Unable to reach patient at time of pre-visit call. Left message for patient to return call when available.  

## 2016-03-10 ENCOUNTER — Encounter: Payer: Self-pay | Admitting: Family Medicine

## 2016-03-10 ENCOUNTER — Ambulatory Visit (INDEPENDENT_AMBULATORY_CARE_PROVIDER_SITE_OTHER): Payer: BLUE CROSS/BLUE SHIELD | Admitting: Family Medicine

## 2016-03-10 VITALS — BP 114/78 | HR 97 | Temp 97.0°F | Ht 62.0 in | Wt 173.0 lb

## 2016-03-10 DIAGNOSIS — E538 Deficiency of other specified B group vitamins: Secondary | ICD-10-CM

## 2016-03-10 DIAGNOSIS — E1169 Type 2 diabetes mellitus with other specified complication: Secondary | ICD-10-CM

## 2016-03-10 DIAGNOSIS — Z1159 Encounter for screening for other viral diseases: Secondary | ICD-10-CM | POA: Diagnosis not present

## 2016-03-10 DIAGNOSIS — E039 Hypothyroidism, unspecified: Secondary | ICD-10-CM

## 2016-03-10 DIAGNOSIS — K5900 Constipation, unspecified: Secondary | ICD-10-CM | POA: Diagnosis not present

## 2016-03-10 DIAGNOSIS — Z23 Encounter for immunization: Secondary | ICD-10-CM | POA: Diagnosis not present

## 2016-03-10 DIAGNOSIS — R0989 Other specified symptoms and signs involving the circulatory and respiratory systems: Secondary | ICD-10-CM

## 2016-03-10 DIAGNOSIS — E119 Type 2 diabetes mellitus without complications: Secondary | ICD-10-CM | POA: Diagnosis not present

## 2016-03-10 DIAGNOSIS — E669 Obesity, unspecified: Secondary | ICD-10-CM

## 2016-03-10 DIAGNOSIS — J4520 Mild intermittent asthma, uncomplicated: Secondary | ICD-10-CM

## 2016-03-10 NOTE — Progress Notes (Signed)
Pre visit review using our clinic review tool, if applicable. No additional management support is needed unless otherwise documented below in the visit note. 

## 2016-03-10 NOTE — Assessment & Plan Note (Signed)
hgba1c acceptable, minimize simple carbs. Increase exercise as tolerated. Continue current meds 

## 2016-03-10 NOTE — Progress Notes (Signed)
Subjective:    Patient ID: Tiffany Mckenzie, female    DOB: 1955-12-24, 60 y.o.   MRN: [id number]  Chief Complaint  Patient presents with  . Follow-up    HPI Patient is in today for follow up.  She is feeling well today. She denies any recent illness. No recent hospitalization or acute concerns. She notes her am sugars have been generally in 120s and 130s, she denies any polyuria or polydipsia. Denies CP/palp/SOB/HA/congestion/fevers/GI or GU c/o. Taking meds as prescribed  Past Medical History  Diagnosis Date  . Asthma   . Arthritis   . History of chicken pox   . Allergic rhinitis   . History of hepatitis     age 89  . Hypertension   . Hypothyroidism   . Hyperglycemia 09/13/2012  . Type II or unspecified type diabetes mellitus without mention of complication, uncontrolled 09/13/2012  . Overactive bladder 03/17/2013    Seeing urology  . Allergic state 03/03/2015  . Raynaud phenomenon 03/03/2015  . Diabetes mellitus type 2 in obese (HCC) 09/13/2012  . Annual physical exam 02/09/2012  . Constipation 03/03/2015  . Left carotid bruit 03/11/2016    Past Surgical History  Procedure Laterality Date  . Tonsillectomy    . Abdominal hysterectomy  2002    Family History  Problem Relation Age of Onset  . Alcohol abuse Father   . Cancer Father     lung  . Diabetes Father   . Arthritis Mother   . Heart disease Mother     Blockage & double bypass  . Hypertension Mother   . Diabetes Mother   . Colon cancer Paternal Grandmother   . Stroke Paternal Grandmother     maternal grandfather  . Breast cancer Maternal Grandmother   . Heart disease Maternal Grandfather   . Scoliosis Daughter   . Cancer Paternal Grandfather     lung    Social History   Social History  . Marital Status: Married    Spouse Name: N/A  . Number of Children: N/A  . Years of Education: N/A   Occupational History  . Not on file.   Social History Main Topics  . Smoking status: Never Smoker   . Smokeless  tobacco: Never Used  . Alcohol Use: Yes     Comment: less than once a month, white wine or mixed drink  . Drug Use: No  . Sexual Activity: Not on file   Other Topics Concern  . Not on file   Social History Narrative    Outpatient Prescriptions Prior to Visit  Medication Sig Dispense Refill  . albuterol (VENTOLIN HFA) 108 (90 Base) MCG/ACT inhaler Inhale 2 puffs into the lungs every 6 (six) hours as needed for wheezing or shortness of breath. 1 Inhaler 1  . Blood Glucose Monitoring Suppl (FREESTYLE LITE) DEVI Reported on 01/15/2016    . Cyanocobalamin (VITAMIN B 12 PO) Take 1,000 mcg by mouth daily.    Marland Kitchen. estradiol (ESTRACE) 1 MG tablet Take 1 mg by mouth daily.    Marland Kitchen. FREESTYLE LITE test strip CHECK BLOOD SUGAR ONCE DAILY AS NEEDED FOR SYMPTOMS 50 each 6  . glucose blood test strip Reported on 01/15/2016    . ibuprofen (ADVIL,MOTRIN) 800 MG tablet Take 1 tablet (800 mg total) by mouth 3 (three) times daily. 21 tablet 0  . Lancets (FREESTYLE) lancets Reported on 01/15/2016    . levothyroxine (SYNTHROID, LEVOTHROID) 100 MCG tablet Take 1 tablet (100 mcg total) by mouth daily before  breakfast. 90 tablet 3  . loratadine (CLARITIN) 10 MG tablet Take 10 mg by mouth daily.    Marland Kitchen LORazepam (ATIVAN) 0.5 MG tablet Take 0.5 mg by mouth 2 (two) times daily as needed.    . metFORMIN (GLUCOPHAGE) 500 MG tablet TAKE 1 TABLET (500 MG TOTAL) BY MOUTH 2 (TWO) TIMES DAILY WITH A MEAL. 60 tablet 6  . neomycin-polymyxin-hydrocortisone (CORTISPORIN) 3.5-10000-1 otic suspension PLACE 3 DROPS INTO BOTH EARS 3 TIMES DAILY. 10 mL 0  . olmesartan (BENICAR) 20 MG tablet TAKE 1/2 TABLET DAILY 45 tablet 3  . triamcinolone cream (KENALOG) 0.1 % Apply 1 application topically 2 (two) times daily. 30 g 0  . azithromycin (ZITHROMAX) 250 MG tablet 2 tabs po once and then 1 tab po daily x 4 days 6 each 0  . HYDROcodone-homatropine (HYCODAN) 5-1.5 MG/5ML syrup Take 5 mLs by mouth every 8 (eight) hours as needed for cough. 180 mL 0    No facility-administered medications prior to visit.    Allergies  Allergen Reactions  . Codeine Nausea And Vomiting    vomiting  . Clarithromycin Rash    Review of Systems  Constitutional: Negative for fever and malaise/fatigue.  HENT: Negative for congestion.   Eyes: Negative for blurred vision.  Respiratory: Negative for shortness of breath.   Cardiovascular: Negative for chest pain, palpitations and leg swelling.       Left carotid bruit  Gastrointestinal: Negative for nausea, abdominal pain and blood in stool.  Genitourinary: Negative for dysuria and frequency.  Musculoskeletal: Negative for falls.  Skin: Negative for rash.  Neurological: Negative for dizziness, loss of consciousness and headaches.  Endo/Heme/Allergies: Negative for environmental allergies.  Psychiatric/Behavioral: Negative for depression. The patient is not nervous/anxious.        Objective:    Physical Exam  Constitutional: She is oriented to person, place, and time. She appears well-developed and well-nourished. No distress.  HENT:  Head: Normocephalic and atraumatic.  Eyes: Conjunctivae are normal.  Neck: Neck supple. No thyromegaly present.  Cardiovascular: Normal rate, regular rhythm and normal heart sounds.   No murmur heard. Pulmonary/Chest: Effort normal and breath sounds normal. No respiratory distress.  Abdominal: Soft. Bowel sounds are normal. She exhibits no distension and no mass. There is no tenderness.  Musculoskeletal: She exhibits no edema.  Lymphadenopathy:    She has no cervical adenopathy.  Neurological: She is alert and oriented to person, place, and time.  Skin: Skin is warm and dry.  Psychiatric: She has a normal mood and affect. Her behavior is normal.    BP 114/78 mmHg  Pulse 97  Temp(Src) 97 F (36.1 C) (Oral)  Ht 5\' 2"  (1.575 m)  Wt 173 lb (78.472 kg)  BMI 31.63 kg/m2  SpO2 95% Wt Readings from Last 3 Encounters:  03/10/16 173 lb (78.472 kg)  02/10/16 173  lb 3.2 oz (78.563 kg)  01/15/16 173 lb 2 oz (78.529 kg)     Lab Results  Component Value Date   WBC 5.6 09/02/2015   HGB 13.3 09/02/2015   HCT 39.4 09/02/2015   PLT 217.0 09/02/2015   GLUCOSE 167* 09/02/2015   CHOL 137 09/02/2015   TRIG 54.0 09/02/2015   HDL 39.40 09/02/2015   LDLCALC 87 09/02/2015   ALT 17 09/02/2015   AST 13 09/02/2015   NA 141 09/02/2015   K 4.0 09/02/2015   CL 105 09/02/2015   CREATININE 0.67 09/02/2015   BUN 15 09/02/2015   CO2 29 09/02/2015   TSH 0.81  09/02/2015   HGBA1C 6.8* 09/02/2015   MICROALBUR <0.7 09/02/2015    Lab Results  Component Value Date   TSH 0.81 09/02/2015   Lab Results  Component Value Date   WBC 5.6 09/02/2015   HGB 13.3 09/02/2015   HCT 39.4 09/02/2015   MCV 89.8 09/02/2015   PLT 217.0 09/02/2015   Lab Results  Component Value Date   NA 141 09/02/2015   K 4.0 09/02/2015   CO2 29 09/02/2015   GLUCOSE 167* 09/02/2015   BUN 15 09/02/2015   CREATININE 0.67 09/02/2015   BILITOT 0.5 09/02/2015   ALKPHOS 94 09/02/2015   AST 13 09/02/2015   ALT 17 09/02/2015   PROT 6.7 09/02/2015   ALBUMIN 4.0 09/02/2015   CALCIUM 9.0 09/02/2015   GFR 94.98 09/02/2015   Lab Results  Component Value Date   CHOL 137 09/02/2015   Lab Results  Component Value Date   HDL 39.40 09/02/2015   Lab Results  Component Value Date   LDLCALC 87 09/02/2015   Lab Results  Component Value Date   TRIG 54.0 09/02/2015   Lab Results  Component Value Date   CHOLHDL 3 09/02/2015   Lab Results  Component Value Date   HGBA1C 6.8* 09/02/2015       Assessment & Plan:   Problem List Items Addressed This Visit    Asthma    No recent exacerbations      B12 deficiency    B12 labs wnl with oral supplementation      Relevant Orders   CBC   TSH   Lipid panel   Comprehensive metabolic panel   Hepatitis C antibody   Microalbumin / creatinine urine ratio   Vitamin B12   Hemoglobin A1c   Constipation   Relevant Orders   CBC   TSH    Lipid panel   Comprehensive metabolic panel   Hepatitis C antibody   Microalbumin / creatinine urine ratio   Vitamin B12   Hemoglobin A1c   Diabetes mellitus type 2 in obese (HCC) (Chronic)    hgba1c acceptable, minimize simple carbs. Increase exercise as tolerated. Continue current meds      Relevant Orders   CBC   TSH   Lipid panel   Comprehensive metabolic panel   Hepatitis C antibody   Microalbumin / creatinine urine ratio   Vitamin B12   Hemoglobin A1c   Hypothyroidism    Patient declines labwork today continue Levothyroxine      Relevant Orders   CBC   TSH   Lipid panel   Comprehensive metabolic panel   Hepatitis C antibody   Microalbumin / creatinine urine ratio   Vitamin B12   Hemoglobin A1c   Left carotid bruit    She is offered Carotid Dopplers today to investigate but she declines for now. She is assymptomatic, she does agree to start ECASA 81 mg daily       Other Visit Diagnoses    Need for hepatitis C screening test    -  Primary    Relevant Orders    CBC    TSH    Lipid panel    Comprehensive metabolic panel    Hepatitis C antibody    Microalbumin / creatinine urine ratio    Vitamin B12    Hemoglobin A1c    Need for shingles vaccine        Relevant Orders    Varicella-zoster vaccine subcutaneous (Completed)       I have discontinued  Ms. Borden HYDROcodone-homatropine and azithromycin. I am also having her maintain her estradiol, LORazepam, Cyanocobalamin (VITAMIN B 12 PO), FREESTYLE LITE, freestyle, glucose blood, ibuprofen, levothyroxine, FREESTYLE LITE, metFORMIN, albuterol, loratadine, triamcinolone cream, neomycin-polymyxin-hydrocortisone, and olmesartan.  No orders of the defined types were placed in this encounter.     Danise Edge, MD

## 2016-03-10 NOTE — Assessment & Plan Note (Signed)
>>  ASSESSMENT AND PLAN FOR TYPE 2 DIABETES MELLITUS WITH OBESITY WRITTEN ON 03/10/2016  5:15 PM BY BLYTH, STACEY A, MD  hgba1c acceptable, minimize simple carbs. Increase exercise as tolerated. Continue current meds

## 2016-03-10 NOTE — Assessment & Plan Note (Signed)
Patient declines labwork today continue Levothyroxine

## 2016-03-10 NOTE — Patient Instructions (Addendum)
NOW Probiotic Vitamin. Available at [employer]. Take a baby pirin, if you start feeling lightheaded or  Any worries, call and we can schedule ultrasound for arterys.  Preventive Care for Adults, Female A healthy lifestyle and preventive care can promote health and wellness. Preventive health guidelines for women include the following key practices.  A routine yearly physical is a good way to check with your health care provider about your health and preventive screening. It is a chance to share any concerns and updates on your health and to receive a thorough exam.  Visit your dentist for a routine exam and preventive care every 6 months. Brush your teeth twice a day and floss once a day. Good oral hygiene prevents tooth decay and gum disease.  The frequency of eye exams is based on your age, health, family medical history, use of contact lenses, and other factors. Follow your health care provider's recommendations for frequency of eye exams.  Eat a healthy diet. Foods like vegetables, fruits, whole grains, low-fat dairy products, and lean protein foods contain the nutrients you need without too many calories. Decrease your intake of foods high in solid fats, added sugars, and salt. Eat the right amount of calories for you.Get information about a proper diet from your health care provider, if necessary.  Regular physical exercise is one of the most important things you can do for your health. Most adults should get at least 150 minutes of moderate-intensity exercise (any activity that increases your heart rate and causes you to sweat) each week. In addition, most adults need muscle-strengthening exercises on 2 or more days a week.  Maintain a healthy weight. The body mass index (BMI) is a screening tool to identify possible weight problems. It provides an estimate of body fat based on height and weight. Your health care provider can find your BMI and can help you achieve or maintain a healthy  weight.For adults 20 years and older:  A BMI below 18.5 is considered underweight.  A BMI of 18.5 to 24.9 is normal.  A BMI of 25 to 29.9 is considered overweight.  A BMI of 30 and above is considered obese.  Maintain normal blood lipids and cholesterol levels by exercising and minimizing your intake of saturated fat. Eat a balanced diet with plenty of fruit and vegetables. Blood tests for lipids and cholesterol should begin at age 64 and be repeated every 5 years. If your lipid or cholesterol levels are high, you are over 50, or you are at high risk for heart disease, you may need your cholesterol levels checked more frequently.Ongoing high lipid and cholesterol levels should be treated with medicines if diet and exercise are not working.  If you smoke, find out from your health care provider how to quit. If you do not use tobacco, do not start.  Lung cancer screening is recommended for adults aged 80-80 years who are at high risk for developing lung cancer because of a history of smoking. A yearly low-dose CT scan of the lungs is recommended for people who have at least a 30-pack-year history of smoking and are a current smoker or have quit within the past 15 years. A pack year of smoking is smoking an average of 1 pack of cigarettes a day for 1 year (for example: 1 pack a day for 30 years or 2 packs a day for 15 years). Yearly screening should continue until the smoker has stopped smoking for at least 15 years. Yearly screening should be  stopped for people who develop a health problem that would prevent them from having lung cancer treatment.  If you are pregnant, do not drink alcohol. If you are breastfeeding, be very cautious about drinking alcohol. If you are not pregnant and choose to drink alcohol, do not have more than 1 drink per day. One drink is considered to be 12 ounces (355 mL) of beer, 5 ounces (148 mL) of wine, or 1.5 ounces (44 mL) of liquor.  Avoid use of street drugs. Do not  share needles with anyone. Ask for help if you need support or instructions about stopping the use of drugs.  High blood pressure causes heart disease and increases the risk of stroke. Your blood pressure should be checked at least every 1 to 2 years. Ongoing high blood pressure should be treated with medicines if weight loss and exercise do not work.  If you are 44-78 years old, ask your health care provider if you should take aspirin to prevent strokes.  Diabetes screening is done by taking a blood sample to check your blood glucose level after you have not eaten for a certain period of time (fasting). If you are not overweight and you do not have risk factors for diabetes, you should be screened once every 3 years starting at age 59. If you are overweight or obese and you are 36-48 years of age, you should be screened for diabetes every year as part of your cardiovascular risk assessment.  Breast cancer screening is essential preventive care for women. You should practice "breast self-awareness." This means understanding the normal appearance and feel of your breasts and may include breast self-examination. Any changes detected, no matter how small, should be reported to a health care provider. Women in their 64s and 30s should have a clinical breast exam (CBE) by a health care provider as part of a regular health exam every 1 to 3 years. After age 41, women should have a CBE every year. Starting at age 71, women should consider having a mammogram (breast X-ray test) every year. Women who have a family history of breast cancer should talk to their health care provider about genetic screening. Women at a high risk of breast cancer should talk to their health care providers about having an MRI and a mammogram every year.  Breast cancer gene (BRCA)-related cancer risk assessment is recommended for women who have family members with BRCA-related cancers. BRCA-related cancers include breast, ovarian, tubal,  and peritoneal cancers. Having family members with these cancers may be associated with an increased risk for harmful changes (mutations) in the breast cancer genes BRCA1 and BRCA2. Results of the assessment will determine the need for genetic counseling and BRCA1 and BRCA2 testing.  Your health care provider may recommend that you be screened regularly for cancer of the pelvic organs (ovaries, uterus, and vagina). This screening involves a pelvic examination, including checking for microscopic changes to the surface of your cervix (Pap test). You may be encouraged to have this screening done every 3 years, beginning at age 6.  For women ages 22-65, health care providers may recommend pelvic exams and Pap testing every 3 years, or they may recommend the Pap and pelvic exam, combined with testing for human papilloma virus (HPV), every 5 years. Some types of HPV increase your risk of cervical cancer. Testing for HPV may also be done on women of any age with unclear Pap test results.  Other health care providers may not recommend any screening for  nonpregnant women who are considered low risk for pelvic cancer and who do not have symptoms. Ask your health care provider if a screening pelvic exam is right for you.  If you have had past treatment for cervical cancer or a condition that could lead to cancer, you need Pap tests and screening for cancer for at least 20 years after your treatment. If Pap tests have been discontinued, your risk factors (such as having a new sexual partner) need to be reassessed to determine if screening should resume. Some women have medical problems that increase the chance of getting cervical cancer. In these cases, your health care provider may recommend more frequent screening and Pap tests.  Colorectal cancer can be detected and often prevented. Most routine colorectal cancer screening begins at the age of 89 years and continues through age 57 years. However, your health care  provider may recommend screening at an earlier age if you have risk factors for colon cancer. On a yearly basis, your health care provider may provide home test kits to check for hidden blood in the stool. Use of a small camera at the end of a tube, to directly examine the colon (sigmoidoscopy or colonoscopy), can detect the earliest forms of colorectal cancer. Talk to your health care provider about this at age 46, when routine screening begins. Direct exam of the colon should be repeated every 5-10 years through age 43 years, unless early forms of precancerous polyps or small growths are found.  People who are at an increased risk for hepatitis B should be screened for this virus. You are considered at high risk for hepatitis B if:  You were born in a country where hepatitis B occurs often. Talk with your health care provider about which countries are considered high risk.  Your parents were born in a high-risk country and you have not received a shot to protect against hepatitis B (hepatitis B vaccine).  You have HIV or AIDS.  You use needles to inject street drugs.  You live with, or have sex with, someone who has hepatitis B.  You get hemodialysis treatment.  You take certain medicines for conditions like cancer, organ transplantation, and autoimmune conditions.  Hepatitis C blood testing is recommended for all people born from [birth year] through 1965 and any individual with known risks for hepatitis C.  Practice safe sex. Use condoms and avoid high-risk sexual practices to reduce the spread of sexually transmitted infections (STIs). STIs include gonorrhea, chlamydia, syphilis, trichomonas, herpes, HPV, and human immunodeficiency virus (HIV). Herpes, HIV, and HPV are viral illnesses that have no cure. They can result in disability, cancer, and death.  You should be screened for sexually transmitted illnesses (STIs) including gonorrhea and chlamydia if:  You are sexually active and are younger  than 24 years.  You are older than 24 years and your health care provider tells you that you are at risk for this type of infection.  Your sexual activity has changed since you were last screened and you are at an increased risk for chlamydia or gonorrhea. Ask your health care provider if you are at risk.  If you are at risk of being infected with HIV, it is recommended that you take a prescription medicine daily to prevent HIV infection. This is called preexposure prophylaxis (PrEP). You are considered at risk if:  You are sexually active and do not regularly use condoms or know the HIV status of your partner(s).  You take drugs by injection.  You  are sexually active with a partner who has HIV.  Talk with your health care provider about whether you are at high risk of being infected with HIV. If you choose to begin PrEP, you should first be tested for HIV. You should then be tested every 3 months for as long as you are taking PrEP.  Osteoporosis is a disease in which the bones lose minerals and strength with aging. This can result in serious bone fractures or breaks. The risk of osteoporosis can be identified using a bone density scan. Women ages 42 years and over and women at risk for fractures or osteoporosis should discuss screening with their health care providers. Ask your health care provider whether you should take a calcium supplement or vitamin D to reduce the rate of osteoporosis.  Menopause can be associated with physical symptoms and risks. Hormone replacement therapy is available to decrease symptoms and risks. You should talk to your health care provider about whether hormone replacement therapy is right for you.  Use sunscreen. Apply sunscreen liberally and repeatedly throughout the day. You should seek shade when your shadow is shorter than you. Protect yourself by wearing long sleeves, pants, a wide-brimmed hat, and sunglasses year round, whenever you are outdoors.  Once a  month, do a whole body skin exam, using a mirror to look at the skin on your back. Tell your health care provider of new moles, moles that have irregular borders, moles that are larger than a pencil eraser, or moles that have changed in shape or color.  Stay current with required vaccines (immunizations).  Influenza vaccine. All adults should be immunized every year.  Tetanus, diphtheria, and acellular pertussis (Td, Tdap) vaccine. Pregnant women should receive 1 dose of Tdap vaccine during each pregnancy. The dose should be obtained regardless of the length of time since the last dose. Immunization is preferred during the 27th-36th week of gestation. An adult who has not previously received Tdap or who does not know her vaccine status should receive 1 dose of Tdap. This initial dose should be followed by tetanus and diphtheria toxoids (Td) booster doses every 10 years. Adults with an unknown or incomplete history of completing a 3-dose immunization series with Td-containing vaccines should begin or complete a primary immunization series including a Tdap dose. Adults should receive a Td booster every 10 years.  Varicella vaccine. An adult without evidence of immunity to varicella should receive 2 doses or a second dose if she has previously received 1 dose. Pregnant females who do not have evidence of immunity should receive the first dose after pregnancy. This first dose should be obtained before leaving the health care facility. The second dose should be obtained 4-8 weeks after the first dose.  Human papillomavirus (HPV) vaccine. Females aged 13-26 years who have not received the vaccine previously should obtain the 3-dose series. The vaccine is not recommended for use in pregnant females. However, pregnancy testing is not needed before receiving a dose. If a female is found to be pregnant after receiving a dose, no treatment is needed. In that case, the remaining doses should be delayed until after the  pregnancy. Immunization is recommended for any person with an immunocompromised condition through the age of 65 years if she did not get any or all doses earlier. During the 3-dose series, the second dose should be obtained 4-8 weeks after the first dose. The third dose should be obtained 24 weeks after the first dose and 16 weeks after the second  dose.  Zoster vaccine. One dose is recommended for adults aged 90 years or older unless certain conditions are present.  Measles, mumps, and rubella (MMR) vaccine. Adults born before [birth year] generally are considered immune to measles and mumps. Adults born in [birth year] or later should have 1 or more doses of MMR vaccine unless there is a contraindication to the vaccine or there is laboratory evidence of immunity to each of the three diseases. A routine second dose of MMR vaccine should be obtained at least 28 days after the first dose for students attending postsecondary schools, health care workers, or international travelers. People who received inactivated measles vaccine or an unknown type of measles vaccine during 1963-1967 should receive 2 doses of MMR vaccine. People who received inactivated mumps vaccine or an unknown type of mumps vaccine before 1979 and are at high risk for mumps infection should consider immunization with 2 doses of MMR vaccine. For females of childbearing age, rubella immunity should be determined. If there is no evidence of immunity, females who are not pregnant should be vaccinated. If there is no evidence of immunity, females who are pregnant should delay immunization until after pregnancy. Unvaccinated health care workers born before [birth year] who lack laboratory evidence of measles, mumps, or rubella immunity or laboratory confirmation of disease should consider measles and mumps immunization with 2 doses of MMR vaccine or rubella immunization with 1 dose of MMR vaccine.  Pneumococcal 13-valent conjugate (PCV13) vaccine. When indicated, a person  who is uncertain of his immunization history and has no record of immunization should receive the PCV13 vaccine. All adults 104 years of age and older should receive this vaccine. An adult aged 21 years or older who has certain medical conditions and has not been previously immunized should receive 1 dose of PCV13 vaccine. This PCV13 should be followed with a dose of pneumococcal polysaccharide (PPSV23) vaccine. Adults who are at high risk for pneumococcal disease should obtain the PPSV23 vaccine at least 8 weeks after the dose of PCV13 vaccine. Adults older than 62 years of age who have normal immune system function should obtain the PPSV23 vaccine dose at least 1 year after the dose of PCV13 vaccine.  Pneumococcal polysaccharide (PPSV23) vaccine. When PCV13 is also indicated, PCV13 should be obtained first. All adults aged 64 years and older should be immunized. An adult younger than age 61 years who has certain medical conditions should be immunized. Any person who resides in a nursing home or long-term care facility should be immunized. An adult smoker should be immunized. People with an immunocompromised condition and certain other conditions should receive both PCV13 and PPSV23 vaccines. People with human immunodeficiency virus (HIV) infection should be immunized as soon as possible after diagnosis. Immunization during chemotherapy or radiation therapy should be avoided. Routine use of PPSV23 vaccine is not recommended for American Indians, [place] Natives, or people younger than 65 years unless there are medical conditions that require PPSV23 vaccine. When indicated, people who have unknown immunization and have no record of immunization should receive PPSV23 vaccine. One-time revaccination 5 years after the first dose of PPSV23 is recommended for people aged 19-64 years who have chronic kidney failure, nephrotic syndrome, asplenia, or immunocompromised conditions. People who received 1-2 doses of PPSV23  before age 70 years should receive another dose of PPSV23 vaccine at age 3 years or later if at least 5 years have passed since the previous dose. Doses of PPSV23 are not needed for people immunized with PPSV23 at or after  age 28 years.  Meningococcal vaccine. Adults with asplenia or persistent complement component deficiencies should receive 2 doses of quadrivalent meningococcal conjugate (MenACWY-D) vaccine. The doses should be obtained at least 2 months apart. Microbiologists working with certain meningococcal bacteria, [employer] recruits, people at risk during an outbreak, and people who travel to or live in countries with a high rate of meningitis should be immunized. A first-year college student up through age 25 years who is living in a residence hall should receive a dose if she did not receive a dose on or after her 16th birthday. Adults who have certain high-risk conditions should receive one or more doses of vaccine.  Hepatitis A vaccine. Adults who wish to be protected from this disease, have certain high-risk conditions, work with hepatitis A-infected animals, work in hepatitis A research labs, or travel to or work in countries with a high rate of hepatitis A should be immunized. Adults who were previously unvaccinated and who anticipate close contact with an international adoptee during the first 60 days after arrival in the [place] from a country with a high rate of hepatitis A should be immunized.  Hepatitis B vaccine. Adults who wish to be protected from this disease, have certain high-risk conditions, may be exposed to blood or other infectious body fluids, are household contacts or sex partners of hepatitis B positive people, are clients or workers in certain care facilities, or travel to or work in countries with a high rate of hepatitis B should be immunized.  Haemophilus influenzae type b (Hib) vaccine. A previously unvaccinated person with asplenia or sickle cell disease or  having a scheduled splenectomy should receive 1 dose of Hib vaccine. Regardless of previous immunization, a recipient of a hematopoietic stem cell transplant should receive a 3-dose series 6-12 months after her successful transplant. Hib vaccine is not recommended for adults with HIV infection. Preventive Services / Frequency Ages 39 to 63 years  Blood pressure check.** / Every 3-5 years.  Lipid and cholesterol check.** / Every 5 years beginning at age 33.  Clinical breast exam.** / Every 3 years for women in their 55s and 48s.  BRCA-related cancer risk assessment.** / For women who have family members with a BRCA-related cancer (breast, ovarian, tubal, or peritoneal cancers).  Pap test.** / Every 2 years from ages 49 through 11. Every 3 years starting at age 30 through age 23 or 11 with a history of 3 consecutive normal Pap tests.  HPV screening.** / Every 3 years from ages 85 through ages 61 to 72 with a history of 3 consecutive normal Pap tests.  Hepatitis C blood test.** / For any individual with known risks for hepatitis C.  Skin self-exam. / Monthly.  Influenza vaccine. / Every year.  Tetanus, diphtheria, and acellular pertussis (Tdap, Td) vaccine.** / Consult your health care provider. Pregnant women should receive 1 dose of Tdap vaccine during each pregnancy. 1 dose of Td every 10 years.  Varicella vaccine.** / Consult your health care provider. Pregnant females who do not have evidence of immunity should receive the first dose after pregnancy.  HPV vaccine. / 3 doses over 6 months, if 20 and younger. The vaccine is not recommended for use in pregnant females. However, pregnancy testing is not needed before receiving a dose.  Measles, mumps, rubella (MMR) vaccine.** / You need at least 1 dose of MMR if you were born in 1957 or later. You may also need a 2nd dose. For females of childbearing age, rubella  immunity should be determined. If there is no evidence of immunity, females  who are not pregnant should be vaccinated. If there is no evidence of immunity, females who are pregnant should delay immunization until after pregnancy.  Pneumococcal 13-valent conjugate (PCV13) vaccine.** / Consult your health care provider.  Pneumococcal polysaccharide (PPSV23) vaccine.** / 1 to 2 doses if you smoke cigarettes or if you have certain conditions.  Meningococcal vaccine.** / 1 dose if you are age 50 to 12 years and a Market researcher living in a residence hall, or have one of several medical conditions, you need to get vaccinated against meningococcal disease. You may also need additional booster doses.  Hepatitis A vaccine.** / Consult your health care provider.  Hepatitis B vaccine.** / Consult your health care provider.  Haemophilus influenzae type b (Hib) vaccine.** / Consult your health care provider. Ages 4 to 80 years  Blood pressure check.** / Every year.  Lipid and cholesterol check.** / Every 5 years beginning at age 65 years.  Lung cancer screening. / Every year if you are aged 65-80 years and have a 30-pack-year history of smoking and currently smoke or have quit within the past 15 years. Yearly screening is stopped once you have quit smoking for at least 15 years or develop a health problem that would prevent you from having lung cancer treatment.  Clinical breast exam.** / Every year after age 73 years.  BRCA-related cancer risk assessment.** / For women who have family members with a BRCA-related cancer (breast, ovarian, tubal, or peritoneal cancers).  Mammogram.** / Every year beginning at age 67 years and continuing for as long as you are in good health. Consult with your health care provider.  Pap test.** / Every 3 years starting at age 32 years through age 7 or 71 years with a history of 3 consecutive normal Pap tests.  HPV screening.** / Every 3 years from ages 65 years through ages 67 to 80 years with a history of 3 consecutive normal  Pap tests.  Fecal occult blood test (FOBT) of stool. / Every year beginning at age 12 years and continuing until age 65 years. You may not need to do this test if you get a colonoscopy every 10 years.  Flexible sigmoidoscopy or colonoscopy.** / Every 5 years for a flexible sigmoidoscopy or every 10 years for a colonoscopy beginning at age 75 years and continuing until age 36 years.  Hepatitis C blood test.** / For all people born from [birth year] through 1967 and any individual with known risks for hepatitis C.  Skin self-exam. / Monthly.  Influenza vaccine. / Every year.  Tetanus, diphtheria, and acellular pertussis (Tdap/Td) vaccine.** / Consult your health care provider. Pregnant women should receive 1 dose of Tdap vaccine during each pregnancy. 1 dose of Td every 10 years.  Varicella vaccine.** / Consult your health care provider. Pregnant females who do not have evidence of immunity should receive the first dose after pregnancy.  Zoster vaccine.** / 1 dose for adults aged 61 years or older.  Measles, mumps, rubella (MMR) vaccine.** / You need at least 1 dose of MMR if you were born in 1957 or later. You may also need a second dose. For females of childbearing age, rubella immunity should be determined. If there is no evidence of immunity, females who are not pregnant should be vaccinated. If there is no evidence of immunity, females who are pregnant should delay immunization until after pregnancy.  Pneumococcal 13-valent conjugate (PCV13) vaccine.** /  Consult your health care provider.  Pneumococcal polysaccharide (PPSV23) vaccine.** / 1 to 2 doses if you smoke cigarettes or if you have certain conditions.  Meningococcal vaccine.** / Consult your health care provider.  Hepatitis A vaccine.** / Consult your health care provider.  Hepatitis B vaccine.** / Consult your health care provider.  Haemophilus influenzae type b (Hib) vaccine.** / Consult your health care provider. Ages 2 years  and over  Blood pressure check.** / Every year.  Lipid and cholesterol check.** / Every 5 years beginning at age 5 years.  Lung cancer screening. / Every year if you are aged 71-80 years and have a 30-pack-year history of smoking and currently smoke or have quit within the past 15 years. Yearly screening is stopped once you have quit smoking for at least 15 years or develop a health problem that would prevent you from having lung cancer treatment.  Clinical breast exam.** / Every year after age 35 years.  BRCA-related cancer risk assessment.** / For women who have family members with a BRCA-related cancer (breast, ovarian, tubal, or peritoneal cancers).  Mammogram.** / Every year beginning at age 51 years and continuing for as long as you are in good health. Consult with your health care provider.  Pap test.** / Every 3 years starting at age 48 years through age 5 or 52 years with 3 consecutive normal Pap tests. Testing can be stopped between 65 and 70 years with 3 consecutive normal Pap tests and no abnormal Pap or HPV tests in the past 10 years.  HPV screening.** / Every 3 years from ages 48 years through ages 100 or 29 years with a history of 3 consecutive normal Pap tests. Testing can be stopped between 65 and 70 years with 3 consecutive normal Pap tests and no abnormal Pap or HPV tests in the past 10 years.  Fecal occult blood test (FOBT) of stool. / Every year beginning at age 74 years and continuing until age 38 years. You may not need to do this test if you get a colonoscopy every 10 years.  Flexible sigmoidoscopy or colonoscopy.** / Every 5 years for a flexible sigmoidoscopy or every 10 years for a colonoscopy beginning at age 74 years and continuing until age 10 years.  Hepatitis C blood test.** / For all people born from [birth year] through 1967 and any individual with known risks for hepatitis C.  Osteoporosis screening.** / A one-time screening for women ages 1 years and over and  women at risk for fractures or osteoporosis.  Skin self-exam. / Monthly.  Influenza vaccine. / Every year.  Tetanus, diphtheria, and acellular pertussis (Tdap/Td) vaccine.** / 1 dose of Td every 10 years.  Varicella vaccine.** / Consult your health care provider.  Zoster vaccine.** / 1 dose for adults aged 58 years or older.  Pneumococcal 13-valent conjugate (PCV13) vaccine.** / Consult your health care provider.  Pneumococcal polysaccharide (PPSV23) vaccine.** / 1 dose for all adults aged 38 years and older.  Meningococcal vaccine.** / Consult your health care provider.  Hepatitis A vaccine.** / Consult your health care provider.  Hepatitis B vaccine.** / Consult your health care provider.  Haemophilus influenzae type b (Hib) vaccine.** / Consult your health care provider. ** Family history and personal history of risk and conditions may change your health care provider's recommendations.   This information is not intended to replace advice given to you by your health care provider. Make sure you discuss any questions you have with your health care provider.  Document Released: 01/26/2002 Document Revised: 12/21/2014 Document Reviewed: 04/27/2011 Elsevier Interactive Patient Education [employer].

## 2016-03-10 NOTE — Assessment & Plan Note (Addendum)
B12 labs wnl with oral supplementation

## 2016-03-11 ENCOUNTER — Encounter: Payer: Self-pay | Admitting: Family Medicine

## 2016-03-11 DIAGNOSIS — R0989 Other specified symptoms and signs involving the circulatory and respiratory systems: Secondary | ICD-10-CM

## 2016-03-11 HISTORY — DX: Other specified symptoms and signs involving the circulatory and respiratory systems: R09.89

## 2016-03-11 NOTE — Assessment & Plan Note (Signed)
No recent exacerbations.  

## 2016-03-11 NOTE — Assessment & Plan Note (Signed)
She is offered Carotid Dopplers today to investigate but she declines for now. She is assymptomatic, she does agree to start ECASA 81 mg daily

## 2016-05-06 ENCOUNTER — Other Ambulatory Visit: Payer: Self-pay | Admitting: Family Medicine

## 2016-06-15 ENCOUNTER — Other Ambulatory Visit: Payer: Self-pay | Admitting: Family Medicine

## 2016-06-15 MED ORDER — METFORMIN HCL 500 MG PO TABS: ORAL_TABLET | ORAL | Status: DC

## 2016-09-07 ENCOUNTER — Other Ambulatory Visit (INDEPENDENT_AMBULATORY_CARE_PROVIDER_SITE_OTHER): Payer: BLUE CROSS/BLUE SHIELD

## 2016-09-07 DIAGNOSIS — K5900 Constipation, unspecified: Secondary | ICD-10-CM

## 2016-09-07 DIAGNOSIS — E119 Type 2 diabetes mellitus without complications: Secondary | ICD-10-CM | POA: Diagnosis not present

## 2016-09-07 DIAGNOSIS — E538 Deficiency of other specified B group vitamins: Secondary | ICD-10-CM | POA: Diagnosis not present

## 2016-09-07 DIAGNOSIS — E039 Hypothyroidism, unspecified: Secondary | ICD-10-CM | POA: Diagnosis not present

## 2016-09-07 DIAGNOSIS — E669 Obesity, unspecified: Secondary | ICD-10-CM

## 2016-09-07 DIAGNOSIS — Z1159 Encounter for screening for other viral diseases: Secondary | ICD-10-CM

## 2016-09-07 DIAGNOSIS — E1169 Type 2 diabetes mellitus with other specified complication: Secondary | ICD-10-CM

## 2016-09-07 LAB — CBC
HEMATOCRIT: 39.4 % (ref 36.0–46.0)
HEMOGLOBIN: 13.5 g/dL (ref 12.0–15.0)
MCHC: 34.3 g/dL (ref 30.0–36.0)
MCV: 88.6 fl (ref 78.0–100.0)
PLATELETS: 232 10*3/uL (ref 150.0–400.0)
RBC: 4.45 Mil/uL (ref 3.87–5.11)
RDW: 13.1 % (ref 11.5–15.5)
WBC: 7.1 10*3/uL (ref 4.0–10.5)

## 2016-09-07 LAB — COMPREHENSIVE METABOLIC PANEL
ALK PHOS: 91 U/L (ref 39–117)
ALT: 17 U/L (ref 0–35)
AST: 14 U/L (ref 0–37)
Albumin: 3.8 g/dL (ref 3.5–5.2)
BILIRUBIN TOTAL: 0.7 mg/dL (ref 0.2–1.2)
BUN: 17 mg/dL (ref 6–23)
CALCIUM: 8.8 mg/dL (ref 8.4–10.5)
CO2: 30 mEq/L (ref 19–32)
CREATININE: 0.78 mg/dL (ref 0.40–1.20)
Chloride: 104 mEq/L (ref 96–112)
GFR: 79.43 mL/min (ref >60.00)
GLUCOSE: 193 mg/dL — AB (ref 70–99)
Potassium: 3.8 mEq/L (ref 3.5–5.1)
Sodium: 141 mEq/L (ref 135–145)
TOTAL PROTEIN: 6.5 g/dL (ref 6.0–8.3)

## 2016-09-07 LAB — LIPID PANEL
CHOLESTEROL: 145 mg/dL (ref 0–200)
HDL: 42.3 mg/dL (ref >39.00)
LDL CALC: 89 mg/dL (ref 0–99)
NonHDL: 103.07
Total CHOL/HDL Ratio: 3
Triglycerides: 70 mg/dL (ref 0.0–149.0)
VLDL: 14 mg/dL (ref 0.0–40.0)

## 2016-09-07 LAB — MICROALBUMIN / CREATININE URINE RATIO
CREATININE, U: 201.3 mg/dL
Microalb Creat Ratio: 0.3 mg/g (ref 0.0–30.0)

## 2016-09-07 LAB — VITAMIN B12: Vitamin B-12: 446 pg/mL (ref 211–911)

## 2016-09-07 LAB — HEPATITIS C ANTIBODY: HCV Ab: NEGATIVE

## 2016-09-07 LAB — TSH: TSH: 1.37 u[IU]/mL (ref 0.35–4.50)

## 2016-09-07 LAB — HEMOGLOBIN A1C: HEMOGLOBIN A1C: 7 % — AB (ref 4.6–6.5)

## 2016-09-09 ENCOUNTER — Other Ambulatory Visit: Payer: Self-pay | Admitting: Family Medicine

## 2016-09-10 ENCOUNTER — Ambulatory Visit: Payer: BLUE CROSS/BLUE SHIELD | Admitting: Family Medicine

## 2016-09-14 ENCOUNTER — Ambulatory Visit: Payer: BLUE CROSS/BLUE SHIELD | Admitting: Family Medicine

## 2016-09-22 ENCOUNTER — Encounter: Payer: Self-pay | Admitting: Family Medicine

## 2016-09-22 ENCOUNTER — Ambulatory Visit (INDEPENDENT_AMBULATORY_CARE_PROVIDER_SITE_OTHER): Payer: BLUE CROSS/BLUE SHIELD | Admitting: Family Medicine

## 2016-09-22 DIAGNOSIS — E669 Obesity, unspecified: Secondary | ICD-10-CM

## 2016-09-22 DIAGNOSIS — E039 Hypothyroidism, unspecified: Secondary | ICD-10-CM | POA: Diagnosis not present

## 2016-09-22 DIAGNOSIS — E1169 Type 2 diabetes mellitus with other specified complication: Secondary | ICD-10-CM

## 2016-09-22 DIAGNOSIS — F411 Generalized anxiety disorder: Secondary | ICD-10-CM

## 2016-09-22 DIAGNOSIS — T7840XD Allergy, unspecified, subsequent encounter: Secondary | ICD-10-CM

## 2016-09-22 DIAGNOSIS — K5900 Constipation, unspecified: Secondary | ICD-10-CM

## 2016-09-22 MED ORDER — LORAZEPAM 0.5 MG PO TABS: 0.5000 mg | ORAL_TABLET | Freq: Two times a day (BID) | ORAL | 3 refills | Status: DC | PRN

## 2016-09-22 MED ORDER — AZITHROMYCIN 250 MG PO TABS: ORAL_TABLET | ORAL | 0 refills | Status: DC

## 2016-09-22 MED ORDER — HYDROCODONE-HOMATROPINE 5-1.5 MG/5ML PO SYRP: 5.0000 mL | ORAL_SOLUTION | Freq: Three times a day (TID) | ORAL | 0 refills | Status: DC | PRN

## 2016-09-22 NOTE — Patient Instructions (Addendum)
Can increase claritin to twice daily and add plain Mucinex twice daily as needed for allergies and respiratory concerns Basic Carbohydrate Counting for Diabetes Mellitus Carbohydrate counting is a method for keeping track of the amount of carbohydrates you eat. Eating carbohydrates naturally increases the level of sugar (glucose) in your blood, so it is important for you to know the amount that is okay for you to have in every meal. Carbohydrate counting helps keep the level of glucose in your blood within normal limits. The amount of carbohydrates allowed is different for every person. A dietitian can help you calculate the amount that is right for you. Once you know the amount of carbohydrates you can have, you can count the carbohydrates in the foods you want to eat. Carbohydrates are found in the following foods:  Grains, such as breads and cereals.  Dried beans and soy products.  Starchy vegetables, such as potatoes, peas, and corn.  Fruit and fruit juices.  Milk and yogurt.  Sweets and snack foods, such as cake, cookies, candy, chips, soft drinks, and fruit drinks. CARBOHYDRATE COUNTING There are two ways to count the carbohydrates in your food. You can use either of the methods or a combination of both. Reading the "Nutrition Facts" on Packaged Food The "Nutrition Facts" is an area that is included on the labels of almost all packaged food and beverages in the [place]. It includes the serving size of that food or beverage and information about the nutrients in each serving of the food, including the grams (g) of carbohydrate per serving.  Decide the number of servings of this food or beverage that you will be able to eat or drink. Multiply that number of servings by the number of grams of carbohydrate that is listed on the label for that serving. The total will be the amount of carbohydrates you will be having when you eat or drink this food or beverage. Learning Standard Serving  Sizes of Food When you eat food that is not packaged or does not include "Nutrition Facts" on the label, you need to measure the servings in order to count the amount of carbohydrates.A serving of most carbohydrate-rich foods contains about 15 g of carbohydrates. The following list includes serving sizes of carbohydrate-rich foods that provide 15 g ofcarbohydrate per serving:   1 slice of bread (1 oz) or 1 six-inch tortilla.    of a hamburger bun or English muffin.  4-6 crackers.   cup unsweetened dry cereal.    cup hot cereal.   cup rice or pasta.    cup mashed potatoes or  of a large baked potato.  1 cup fresh fruit or one small piece of fruit.    cup canned or frozen fruit or fruit juice.  1 cup milk.   cup plain fat-free yogurt or yogurt sweetened with artificial sweeteners.   cup cooked dried beans or starchy vegetable, such as peas, corn, or potatoes.  Decide the number of standard-size servings that you will eat. Multiply that number of servings by 15 (the grams of carbohydrates in that serving). For example, if you eat 2 cups of strawberries, you will have eaten 2 servings and 30 g of carbohydrates (2 servings x 15 g = 30 g). For foods such as soups and casseroles, in which more than one food is mixed in, you will need to count the carbohydrates in each food that is included. EXAMPLE OF CARBOHYDRATE COUNTING Sample Dinner  3 oz chicken breast.  cup of brown rice.   cup of corn.  1 cup milk.   1 cup strawberries with sugar-free whipped topping.  Carbohydrate Calculation Step 1: Identify the foods that contain carbohydrates:   Rice.   Corn.   Milk.   Strawberries. Step 2:Calculate the number of servings eaten of each:   2 servings of rice.   1 serving of corn.   1 serving of milk.   1 serving of strawberries. Step 3: Multiply each of those number of servings by 15 g:   2 servings of rice x 15 g = 30 g.   1 serving of  corn x 15 g = 15 g.   1 serving of milk x 15 g = 15 g.   1 serving of strawberries x 15 g = 15 g. Step 4: Add together all of the amounts to find the total grams of carbohydrates eaten: 30 g + 15 g + 15 g + 15 g = 75 g.   This information is not intended to replace advice given to you by your health care provider. Make sure you discuss any questions you have with your health care provider.   Document Released: 11/30/2005 Document Revised: 12/21/2014 Document Reviewed: 10/27/2013 Elsevier Interactive Patient Education [employer] Elsevier Inc.

## 2016-09-22 NOTE — Progress Notes (Signed)
Pre visit review using our clinic review tool, if applicable. No additional management support is needed unless otherwise documented below in the visit note. 

## 2016-09-30 ENCOUNTER — Encounter: Payer: Self-pay | Admitting: Family Medicine

## 2016-09-30 DIAGNOSIS — F411 Generalized anxiety disorder: Secondary | ICD-10-CM

## 2016-09-30 HISTORY — DX: Generalized anxiety disorder: F41.1

## 2016-09-30 NOTE — Assessment & Plan Note (Signed)
hgba1c acceptable, minimize simple carbs. Increase exercise as tolerated.  

## 2016-09-30 NOTE — Assessment & Plan Note (Signed)
On Levothyroxine, continue to monitor 

## 2016-09-30 NOTE — Assessment & Plan Note (Signed)
>>  ASSESSMENT AND PLAN FOR TYPE 2 DIABETES MELLITUS WITH OBESITY WRITTEN ON 09/30/2016  2:07 PM BY BLYTH, STACEY A, MD  hgba1c acceptable, minimize simple carbs. Increase exercise as tolerated.

## 2016-09-30 NOTE — Assessment & Plan Note (Signed)
Has used Lorazepam prn for anxiety and insomnia and has previously obtained from her GYN but would like to maintain refills here for now. We will allow her prn use for now but warned about ongoing use and risk.

## 2016-09-30 NOTE — Progress Notes (Signed)
Patient ID: Tiffany Mckenzie, female   DOB: 1956/06/05, 60 y.o.   MRN: [id number]   Subjective:    Patient ID: Tiffany Mckenzie, female    DOB: 1956-05-24, 60 y.o.   MRN: [id number]  Chief Complaint  Patient presents with  . Follow-up    HPI Patient is in today for follow up. She is struggling with her allergies recently with increased head congestion, mild tickle in throat resulting in dry cough and some sneezing at times. No fevers or sign of acute illness. Denies CP/palp/SOB/HA/fevers/GI or GU c/o. Taking meds as prescribed  Past Medical History:  Diagnosis Date  . Allergic rhinitis   . Allergic state 03/03/2015  . Annual physical exam 02/09/2012  . Anxiety state 09/30/2016  . Arthritis   . Asthma   . Constipation 03/03/2015  . Diabetes mellitus type 2 in obese (HCC) 09/13/2012  . History of chicken pox   . History of hepatitis    age 85  . Hyperglycemia 09/13/2012  . Hypertension   . Hypothyroidism   . Left carotid bruit 03/11/2016  . Overactive bladder 03/17/2013   Seeing urology  . Raynaud phenomenon 03/03/2015  . Type II or unspecified type diabetes mellitus without mention of complication, uncontrolled 09/13/2012    Past Surgical History:  Procedure Laterality Date  . ABDOMINAL HYSTERECTOMY  2002  . TONSILLECTOMY      Family History  Problem Relation Age of Onset  . Alcohol abuse Father   . Cancer Father     lung  . Diabetes Father   . Arthritis Mother   . Heart disease Mother     Blockage & double bypass  . Hypertension Mother   . Diabetes Mother   . Colon cancer Paternal Grandmother   . Stroke Paternal Grandmother     maternal grandfather  . Breast cancer Maternal Grandmother   . Heart disease Maternal Grandfather   . Scoliosis Daughter   . Cancer Paternal Grandfather     lung    Social History   Social History  . Marital status: Married    Spouse name: N/A  . Number of children: N/A  . Years of education: N/A   Occupational History  . Not on file.    Social History Main Topics  . Smoking status: Never Smoker  . Smokeless tobacco: Never Used  . Alcohol use Yes     Comment: less than once a month, white wine or mixed drink  . Drug use: No  . Sexual activity: Not on file   Other Topics Concern  . Not on file   Social History Narrative  . No narrative on file    Outpatient Medications Prior to Visit  Medication Sig Dispense Refill  . albuterol (VENTOLIN HFA) 108 (90 Base) MCG/ACT inhaler Inhale 2 puffs into the lungs every 6 (six) hours as needed for wheezing or shortness of breath. 1 Inhaler 1  . Blood Glucose Monitoring Suppl (FREESTYLE LITE) DEVI Reported on 01/15/2016    . Cyanocobalamin (VITAMIN B 12 PO) Take 1,000 mcg by mouth daily.    Marland Kitchen estradiol (ESTRACE) 1 MG tablet Take 1 mg by mouth daily.    Marland Kitchen FREESTYLE LITE test strip CHECK BLOOD SUGAR ONCE DAILY AS NEEDED FOR SYMPTOMS 100 each 6  . glucose blood test strip Reported on 01/15/2016    . ibuprofen (ADVIL,MOTRIN) 800 MG tablet Take 1 tablet (800 mg total) by mouth 3 (three) times daily. 21 tablet 0  . Lancets (FREESTYLE) lancets Reported on  01/15/2016    . levothyroxine (SYNTHROID, LEVOTHROID) 100 MCG tablet Take 1 tablet (100 mcg total) by mouth daily before breakfast. 90 tablet 3  . levothyroxine (SYNTHROID, LEVOTHROID) 100 MCG tablet TAKE 1 TABLET DAILY BEFORE BREAKFAST 90 tablet 1  . loratadine (CLARITIN) 10 MG tablet Take 10 mg by mouth daily.    . metFORMIN (GLUCOPHAGE) 500 MG tablet TAKE 1 TABLET (500 MG TOTAL) BY MOUTH 2 (TWO) TIMES DAILY WITH A MEAL. 60 tablet 6  . neomycin-polymyxin-hydrocortisone (CORTISPORIN) 3.5-10000-1 otic suspension PLACE 3 DROPS INTO BOTH EARS 3 TIMES DAILY. 10 mL 0  . olmesartan (BENICAR) 20 MG tablet TAKE 1/2 TABLET DAILY 45 tablet 3  . triamcinolone cream (KENALOG) 0.1 % Apply 1 application topically 2 (two) times daily. 30 g 0  . LORazepam (ATIVAN) 0.5 MG tablet Take 0.5 mg by mouth 2 (two) times daily as needed.     No  facility-administered medications prior to visit.     Allergies  Allergen Reactions  . Codeine Nausea And Vomiting    vomiting  . Clarithromycin Rash    Review of Systems  Constitutional: Negative for fever and malaise/fatigue.  HENT: Positive for congestion.   Eyes: Negative for blurred vision.  Respiratory: Positive for cough. Negative for shortness of breath.   Cardiovascular: Negative for chest pain, palpitations and leg swelling.  Gastrointestinal: Negative for abdominal pain, blood in stool and nausea.  Genitourinary: Negative for dysuria and frequency.  Musculoskeletal: Negative for falls.  Skin: Negative for rash.  Neurological: Negative for dizziness, loss of consciousness and headaches.  Endo/Heme/Allergies: Negative for environmental allergies.  Psychiatric/Behavioral: Negative for depression. The patient is nervous/anxious and has insomnia.        Objective:    Physical Exam  Constitutional: She is oriented to person, place, and time. She appears well-developed and well-nourished. No distress.  HENT:  Head: Normocephalic and atraumatic.  Nose: Nose normal.  Eyes: Right eye exhibits no discharge. Left eye exhibits no discharge.  Neck: Normal range of motion. Neck supple.  Cardiovascular: Normal rate and regular rhythm.   No murmur heard. Pulmonary/Chest: Effort normal and breath sounds normal.  Abdominal: Soft. Bowel sounds are normal. There is no tenderness.  Musculoskeletal: She exhibits no edema.  Neurological: She is alert and oriented to person, place, and time.  Skin: Skin is warm and dry.  Psychiatric: She has a normal mood and affect.  Nursing note and vitals reviewed.   BP 102/70 (BP Location: Left Arm, Patient Position: Sitting, Cuff Size: Normal)   Pulse 83   Temp 98.2 F (36.8 C) (Oral)   Ht 5\' 2"  (1.575 m)   Wt 177 lb 8 oz (80.5 kg)   SpO2 97%   BMI 32.47 kg/m  Wt Readings from Last 3 Encounters:  09/22/16 177 lb 8 oz (80.5 kg)    03/10/16 173 lb (78.5 kg)  02/10/16 173 lb 3.2 oz (78.6 kg)     Lab Results  Component Value Date   WBC 7.1 09/07/2016   HGB 13.5 09/07/2016   HCT 39.4 09/07/2016   PLT 232.0 09/07/2016   GLUCOSE 193 (H) 09/07/2016   CHOL 145 09/07/2016   TRIG 70.0 09/07/2016   HDL 42.30 09/07/2016   LDLCALC 89 09/07/2016   ALT 17 09/07/2016   AST 14 09/07/2016   NA 141 09/07/2016   K 3.8 09/07/2016   CL 104 09/07/2016   CREATININE 0.78 09/07/2016   BUN 17 09/07/2016   CO2 30 09/07/2016   TSH 1.37 09/07/2016  HGBA1C 7.0 (H) 09/07/2016   MICROALBUR <0.7 09/07/2016    Lab Results  Component Value Date   TSH 1.37 09/07/2016   Lab Results  Component Value Date   WBC 7.1 09/07/2016   HGB 13.5 09/07/2016   HCT 39.4 09/07/2016   MCV 88.6 09/07/2016   PLT 232.0 09/07/2016   Lab Results  Component Value Date   NA 141 09/07/2016   K 3.8 09/07/2016   CO2 30 09/07/2016   GLUCOSE 193 (H) 09/07/2016   BUN 17 09/07/2016   CREATININE 0.78 09/07/2016   BILITOT 0.7 09/07/2016   ALKPHOS 91 09/07/2016   AST 14 09/07/2016   ALT 17 09/07/2016   PROT 6.5 09/07/2016   ALBUMIN 3.8 09/07/2016   CALCIUM 8.8 09/07/2016   GFR 79.43 09/07/2016   Lab Results  Component Value Date   CHOL 145 09/07/2016   Lab Results  Component Value Date   HDL 42.30 09/07/2016   Lab Results  Component Value Date   LDLCALC 89 09/07/2016   Lab Results  Component Value Date   TRIG 70.0 09/07/2016   Lab Results  Component Value Date   CHOLHDL 3 09/07/2016   Lab Results  Component Value Date   HGBA1C 7.0 (H) 09/07/2016       Assessment & Plan:   Problem List Items Addressed This Visit    Diabetes mellitus type 2 in obese (HCC) (Chronic)    hgba1c acceptable, minimize simple carbs. Increase exercise as tolerated.      Hypothyroidism    On Levothyroxine, continue to monitor      Allergic state    May use antihistamines bid, flonase and nasal saline prn      Constipation    Encouraged  increased hydration and fiber in diet. Daily probiotics. If bowels not moving can use MOM 2 tbls po in 4 oz of warm prune juice by mouth every 2-3 days. If no results then repeat in 4 hours with  Dulcolax suppository pr, may repeat again in 4 more hours as needed. Seek care if symptoms worsen. Consider daily Miralax and/or Dulcolax if symptoms persist.       Anxiety state    Has used Lorazepam prn for anxiety and insomnia and has previously obtained from her GYN but would like to maintain refills here for now. We will allow her prn use for now but warned about ongoing use and risk.      Relevant Medications   LORazepam (ATIVAN) 0.5 MG tablet    Other Visit Diagnoses   None.     I have changed Ms. Bailey's LORazepam. I am also having her start on azithromycin. Additionally, I am having her maintain her estradiol, Cyanocobalamin (VITAMIN B 12 PO), FREESTYLE LITE, freestyle, glucose blood, ibuprofen, levothyroxine, albuterol, loratadine, triamcinolone cream, neomycin-polymyxin-hydrocortisone, olmesartan, levothyroxine, metFORMIN, FREESTYLE LITE, and HYDROcodone-homatropine.  Meds ordered this encounter  Medications  . azithromycin (ZITHROMAX) 250 MG tablet    Sig: 2 tabs po once then 1 tab po daily x 4 day    Dispense:  6 tablet    Refill:  0  . HYDROcodone-homatropine (HYCODAN) 5-1.5 MG/5ML syrup    Sig: Take 5 mLs by mouth every 8 (eight) hours as needed for cough.    Dispense:  180 mL    Refill:  0  . LORazepam (ATIVAN) 0.5 MG tablet    Sig: Take 1 tablet (0.5 mg total) by mouth 2 (two) times daily as needed.    Dispense:  60 tablet  Refill:  3     Penni Homans, MD

## 2016-09-30 NOTE — Assessment & Plan Note (Signed)
Encouraged increased hydration and fiber in diet. Daily probiotics. If bowels not moving can use MOM 2 tbls po in 4 oz of warm prune juice by mouth every 2-3 days. If no results then repeat in 4 hours with  Dulcolax suppository pr, may repeat again in 4 more hours as needed. Seek care if symptoms worsen. Consider daily Miralax and/or Dulcolax if symptoms persist.  

## 2016-09-30 NOTE — Assessment & Plan Note (Signed)
May use antihistamines bid, flonase and nasal saline prn

## 2016-11-04 ENCOUNTER — Other Ambulatory Visit: Payer: Self-pay | Admitting: Family Medicine

## 2016-11-04 NOTE — Telephone Encounter (Signed)
Medication filled to pharmacy as requested.   

## 2016-12-09 ENCOUNTER — Other Ambulatory Visit: Payer: Self-pay | Admitting: Family Medicine

## 2017-01-21 ENCOUNTER — Other Ambulatory Visit: Payer: Self-pay | Admitting: Family Medicine

## 2017-01-23 ENCOUNTER — Other Ambulatory Visit: Payer: Self-pay | Admitting: Family Medicine

## 2017-02-09 ENCOUNTER — Other Ambulatory Visit: Payer: Self-pay | Admitting: Family Medicine

## 2017-03-25 ENCOUNTER — Ambulatory Visit (INDEPENDENT_AMBULATORY_CARE_PROVIDER_SITE_OTHER): Payer: BLUE CROSS/BLUE SHIELD | Admitting: Family Medicine

## 2017-03-25 ENCOUNTER — Encounter: Payer: Self-pay | Admitting: Family Medicine

## 2017-03-25 VITALS — BP 125/70 | HR 91 | Temp 98.2°F | Ht 62.0 in | Wt 178.4 lb

## 2017-03-25 DIAGNOSIS — E785 Hyperlipidemia, unspecified: Secondary | ICD-10-CM

## 2017-03-25 DIAGNOSIS — E669 Obesity, unspecified: Secondary | ICD-10-CM

## 2017-03-25 DIAGNOSIS — Z0000 Encounter for general adult medical examination without abnormal findings: Secondary | ICD-10-CM

## 2017-03-25 DIAGNOSIS — E538 Deficiency of other specified B group vitamins: Secondary | ICD-10-CM

## 2017-03-25 DIAGNOSIS — E1169 Type 2 diabetes mellitus with other specified complication: Secondary | ICD-10-CM

## 2017-03-25 DIAGNOSIS — E039 Hypothyroidism, unspecified: Secondary | ICD-10-CM | POA: Diagnosis not present

## 2017-03-25 DIAGNOSIS — E6609 Other obesity due to excess calories: Secondary | ICD-10-CM

## 2017-03-25 LAB — CBC
HCT: 40.5 % (ref 36.0–46.0)
Hemoglobin: 14 g/dL (ref 12.0–15.0)
MCHC: 34.5 g/dL (ref 30.0–36.0)
MCV: 88.8 fl (ref 78.0–100.0)
Platelets: 247 10*3/uL (ref 150.0–400.0)
RBC: 4.56 Mil/uL (ref 3.87–5.11)
RDW: 13.1 % (ref 11.5–15.5)
WBC: 7.6 10*3/uL (ref 4.0–10.5)

## 2017-03-25 LAB — LIPID PANEL
CHOL/HDL RATIO: 4
Cholesterol: 168 mg/dL (ref 0–200)
HDL: 43.3 mg/dL (ref >39.00)
LDL Cholesterol: 105 mg/dL — ABNORMAL HIGH (ref 0–99)
NonHDL: 125
Triglycerides: 99 mg/dL (ref 0.0–149.0)
VLDL: 19.8 mg/dL (ref 0.0–40.0)

## 2017-03-25 LAB — COMPREHENSIVE METABOLIC PANEL
ALT: 21 U/L (ref 0–35)
AST: 15 U/L (ref 0–37)
Albumin: 4.1 g/dL (ref 3.5–5.2)
Alkaline Phosphatase: 98 U/L (ref 39–117)
BILIRUBIN TOTAL: 0.6 mg/dL (ref 0.2–1.2)
BUN: 16 mg/dL (ref 6–23)
CHLORIDE: 104 meq/L (ref 96–112)
CO2: 29 meq/L (ref 19–32)
CREATININE: 0.74 mg/dL (ref 0.40–1.20)
Calcium: 9.1 mg/dL (ref 8.4–10.5)
GFR: 84.26 mL/min (ref >60.00)
GLUCOSE: 177 mg/dL — AB (ref 70–99)
Potassium: 4.1 mEq/L (ref 3.5–5.1)
SODIUM: 138 meq/L (ref 135–145)
Total Protein: 6.7 g/dL (ref 6.0–8.3)

## 2017-03-25 LAB — TSH: TSH: 1.15 u[IU]/mL (ref 0.35–4.50)

## 2017-03-25 LAB — HEMOGLOBIN A1C: Hgb A1c MFr Bld: 7.3 % — ABNORMAL HIGH (ref 4.6–6.5)

## 2017-03-25 LAB — VITAMIN B12: VITAMIN B 12: 235 pg/mL (ref 211–911)

## 2017-03-25 MED ORDER — ACETIC ACID 2 % OT SOLN
4.0000 [drp] | Freq: Three times a day (TID) | OTIC | 1 refills | Status: DC
Start: 2017-03-25 — End: 2018-11-24

## 2017-03-25 NOTE — Assessment & Plan Note (Signed)
Patient encouraged to maintain heart healthy diet, regular exercise, adequate sleep. Consider daily probiotics. Take medications as prescribed. Labs checked today.

## 2017-03-25 NOTE — Patient Instructions (Addendum)
Spoke with you today in regards to Tiffany Mckenzie. Encouraged to you bring a notarized copy of your Living Will and Healthcare Power of Attorney into Dr. Frederik Pear Office to be scanned into your Chart. Preventive Care 40-64 Years, Female Preventive care refers to lifestyle choices and visits with your health care provider that can promote health and wellness. What does preventive care include?  A yearly physical exam. This is also called an annual well check.  Dental exams once or twice a year.  Routine eye exams. Ask your health care provider how often you should have your eyes checked.  Personal lifestyle choices, including:  Daily care of your teeth and gums.  Regular physical activity.  Eating a healthy diet.  Avoiding tobacco and drug use.  Limiting alcohol use.  Practicing safe sex.  Taking low-dose aspirin daily starting at age 30.  Taking vitamin and mineral supplements as recommended by your health care provider. What happens during an annual well check? The services and screenings done by your health care provider during your annual well check will depend on your age, overall health, lifestyle risk factors, and family history of disease. Counseling  Your health care provider may ask you questions about your:  Alcohol use.  Tobacco use.  Drug use.  Emotional well-being.  Home and relationship well-being.  Sexual activity.  Eating habits.  Work and work Statistician.  Method of birth control.  Menstrual cycle.  Pregnancy history. Screening  You may have the following tests or measurements:  Height, weight, and BMI.  Blood pressure.  Lipid and cholesterol levels. These may be checked every 5 years, or more frequently if you are over 53 years old.  Skin check.  Lung cancer screening. You may have this screening every year starting at age 28 if you have a 30-pack-year history of smoking and currently smoke or have quit within the past 15  years.  Fecal occult blood test (FOBT) of the stool. You may have this test every year starting at age 28.  Flexible sigmoidoscopy or colonoscopy. You may have a sigmoidoscopy every 5 years or a colonoscopy every 10 years starting at age 71.  Hepatitis C blood test.  Hepatitis B blood test.  Sexually transmitted disease (STD) testing.  Diabetes screening. This is done by checking your blood sugar (glucose) after you have not eaten for a while (fasting). You may have this done every 1-3 years.  Mammogram. This may be done every 1-2 years. Talk to your health care provider about when you should start having regular mammograms. This may depend on whether you have a family history of breast cancer.  BRCA-related cancer screening. This may be done if you have a family history of breast, ovarian, tubal, or peritoneal cancers.  Pelvic exam and Pap test. This may be done every 3 years starting at age 69. Starting at age 51, this may be done every 5 years if you have a Pap test in combination with an HPV test.  Bone density scan. This is done to screen for osteoporosis. You may have this scan if you are at high risk for osteoporosis. Discuss your test results, treatment options, and if necessary, the need for more tests with your health care provider. Vaccines  Your health care provider may recommend certain vaccines, such as:  Influenza vaccine. This is recommended every year.  Tetanus, diphtheria, and acellular pertussis (Tdap, Td) vaccine. You may need a Td booster every 10 years.  Varicella vaccine. You may need this if  you have not been vaccinated.  Zoster vaccine. You may need this after age 64.  Measles, mumps, and rubella (MMR) vaccine. You may need at least one dose of MMR if you were born in 1957 or later. You may also need a second dose.  Pneumococcal 13-valent conjugate (PCV13) vaccine. You may need this if you have certain conditions and were not previously  vaccinated.  Pneumococcal polysaccharide (PPSV23) vaccine. You may need one or two doses if you smoke cigarettes or if you have certain conditions.  Meningococcal vaccine. You may need this if you have certain conditions.  Hepatitis A vaccine. You may need this if you have certain conditions or if you travel or work in places where you may be exposed to hepatitis A.  Hepatitis B vaccine. You may need this if you have certain conditions or if you travel or work in places where you may be exposed to hepatitis B.  Haemophilus influenzae type b (Hib) vaccine. You may need this if you have certain conditions. Talk to your health care provider about which screenings and vaccines you need and how often you need them. This information is not intended to replace advice given to you by your health care provider. Make sure you discuss any questions you have with your health care provider. Document Released: 12/27/2015 Document Revised: 08/19/2016 Document Reviewed: 10/01/2015 Elsevier Interactive Patient Education  2017 [employer].

## 2017-03-25 NOTE — Assessment & Plan Note (Signed)
On Levothyroxine, continue to monitor 

## 2017-03-25 NOTE — Assessment & Plan Note (Signed)
Taking oral Vitamin B12 tab daily

## 2017-03-25 NOTE — Progress Notes (Signed)
Pre visit review using our clinic review tool, if applicable. No additional management support is needed unless otherwise documented below in the visit note. 

## 2017-03-25 NOTE — Assessment & Plan Note (Signed)
>>  ASSESSMENT AND PLAN FOR TYPE 2 DIABETES MELLITUS WITH OBESITY WRITTEN ON 03/25/2017 10:32 AM BY BLYTH, STACEY A, MD  hgba1c acceptable, minimize simple carbs. Increase exercise as tolerated. Continue current meds

## 2017-03-25 NOTE — Assessment & Plan Note (Signed)
hgba1c acceptable, minimize simple carbs. Increase exercise as tolerated. Continue current meds 

## 2017-03-25 NOTE — Progress Notes (Signed)
Patient ID: VERTIS BAUDER, female   DOB: Aug 01, 1956, 61 y.o.   MRN: [id number]   Subjective:  I acted as a Neurosurgeon for Tiffany Edge, MD. [place], [place]   Patient ID: Tiffany Mckenzie, female    DOB: [birth year], 61 y.o.   MRN: [id number]  Chief Complaint  Patient presents with  . Annual Exam  . Diabetes  . Asthma    HPI  Patient is in today for an annual examination. Patient has a Hx Type II Diabetes, asthma, hypothyroidism. Patient has no acute concerns noted at this time. She has been feeling well and staying active. No difficulty with ADLs at home. Is trying to maintain a heart healthy diet and exercises regularly. No recent febrile illness or hospitalization. Denies CP/palp/SOB/HA/congestion/fevers/GI or GU c/o. Taking meds as prescribed.   Patient Care Team: [name], MD as PCP - General (Family Medicine) Blondell Reveal, MD (Obstetrics and Gynecology) Francee Piccolo, MD as Consulting Physician (Ophthalmology)   Past Medical History:  Diagnosis Date  . Allergic rhinitis   . Allergic state 03/03/2015  . Annual physical exam 02/09/2012  . Anxiety state 09/30/2016  . Arthritis   . Asthma   . Constipation 03/03/2015  . Diabetes mellitus type 2 in obese (HCC) 09/13/2012  . History of chicken pox   . History of hepatitis    age 70  . Hyperglycemia 09/13/2012  . Hypertension   . Hypothyroidism   . Left carotid bruit 03/11/2016  . Obesity 03/28/2017  . Overactive bladder 03/17/2013   Seeing urology  . Raynaud phenomenon 03/03/2015  . Type II or unspecified type diabetes mellitus without mention of complication, uncontrolled 09/13/2012    Past Surgical History:  Procedure Laterality Date  . ABDOMINAL HYSTERECTOMY  2002  . TONSILLECTOMY      Family History  Problem Relation Age of Onset  . Alcohol abuse Father   . Cancer Father     lung  . Diabetes Father   . Arthritis Mother   . Heart disease Mother     Blockage & double bypass  . Hypertension Mother   . Diabetes Mother    . Colon cancer Paternal Grandmother   . Stroke Paternal Grandmother     maternal grandfather  . Breast cancer Maternal Grandmother   . Heart disease Maternal Grandfather   . Scoliosis Daughter   . Cancer Paternal Grandfather     lung    Social History   Social History  . Marital status: Married    Spouse name: N/A  . Number of children: N/A  . Years of education: N/A   Occupational History  . Not on file.   Social History Main Topics  . Smoking status: Never Smoker  . Smokeless tobacco: Never Used  . Alcohol use Yes     Comment: less than once a month, white wine or mixed drink  . Drug use: No  . Sexual activity: Not on file   Other Topics Concern  . Not on file   Social History Narrative  . No narrative on file    Outpatient Medications Prior to Visit  Medication Sig Dispense Refill  . albuterol (VENTOLIN HFA) 108 (90 Base) MCG/ACT inhaler Inhale 2 puffs into the lungs every 6 (six) hours as needed for wheezing or shortness of breath. 1 Inhaler 1  . Blood Glucose Monitoring Suppl (FREESTYLE LITE) DEVI Reported on 01/15/2016    . Cyanocobalamin (VITAMIN B 12 PO) Take 1,000 mcg by mouth daily.    Marland Kitchen  estradiol (ESTRACE) 1 MG tablet Take 1 mg by mouth daily.    Marland Kitchen FREESTYLE LITE test strip CHECK BLOOD SUGAR ONCE DAILY AS NEEDED FOR SYMPTOMS 100 each 6  . glucose blood test strip Reported on 01/15/2016    . HYDROcodone-homatropine (HYCODAN) 5-1.5 MG/5ML syrup Take 5 mLs by mouth every 8 (eight) hours as needed for cough. 180 mL 0  . ibuprofen (ADVIL,MOTRIN) 800 MG tablet Take 1 tablet (800 mg total) by mouth 3 (three) times daily. 21 tablet 0  . Lancets (FREESTYLE) lancets Reported on 01/15/2016    . levothyroxine (SYNTHROID, LEVOTHROID) 100 MCG tablet TAKE 1 TABLET DAILY BEFORE BREAKFAST 90 tablet 1  . loratadine (CLARITIN) 10 MG tablet Take 10 mg by mouth daily.    Marland Kitchen LORazepam (ATIVAN) 0.5 MG tablet Take 1 tablet (0.5 mg total) by mouth 2 (two) times daily as needed. 60  tablet 3  . metFORMIN (GLUCOPHAGE) 500 MG tablet TAKE 1 TABLET TWICE A DAY WITH MEALS 60 tablet 6  . neomycin-polymyxin-hydrocortisone (CORTISPORIN) 3.5-10000-1 otic suspension PLACE 3 DROPS INTO BOTH EARS 3 TIMES DAILY. 10 mL 0  . olmesartan (BENICAR) 20 MG tablet TAKE 1/2 TABLET DAILY 45 tablet 3  . triamcinolone cream (KENALOG) 0.1 % Apply 1 application topically 2 (two) times daily. 30 g 0  . azithromycin (ZITHROMAX) 250 MG tablet 2 tabs po once then 1 tab po daily x 4 day (Patient not taking: Reported on 03/25/2017) 6 tablet 0   No facility-administered medications prior to visit.     Allergies  Allergen Reactions  . Codeine Nausea And Vomiting    vomiting  . Clarithromycin Rash    Review of Systems  Constitutional: Negative for fever and malaise/fatigue.  HENT: Negative for congestion.   Eyes: Negative for blurred vision.  Respiratory: Negative for shortness of breath.   Cardiovascular: Negative for chest pain, palpitations and leg swelling.  Gastrointestinal: Negative for abdominal pain, blood in stool and nausea.  Genitourinary: Negative for dysuria and frequency.  Musculoskeletal: Negative for falls.  Skin: Negative for rash.  Neurological: Negative for dizziness, loss of consciousness and headaches.  Endo/Heme/Allergies: Negative for environmental allergies.  Psychiatric/Behavioral: Negative for depression. The patient is not nervous/anxious.        Objective:    Physical Exam  Constitutional: She is oriented to person, place, and time. She appears well-developed and well-nourished. No distress.  HENT:  Head: Normocephalic and atraumatic.  Eyes: Conjunctivae are normal.  Neck: Neck supple. No thyromegaly present.  Cardiovascular: Normal rate, regular rhythm and normal heart sounds.   No murmur heard. Pulmonary/Chest: Effort normal and breath sounds normal. No respiratory distress.  Abdominal: Soft. Bowel sounds are normal. She exhibits no distension and no mass.  There is no tenderness.  Musculoskeletal: She exhibits no edema.  Lymphadenopathy:    She has no cervical adenopathy.  Neurological: She is alert and oriented to person, place, and time.  Skin: Skin is warm and dry.  Psychiatric: She has a normal mood and affect. Her behavior is normal.    BP 125/70 (BP Location: Left Arm, Patient Position: Sitting, Cuff Size: Normal)   Pulse 91   Temp 98.2 F (36.8 C) (Oral)   Ht  (1.575 m)   Wt 178 lb 6.4 oz (80.9 kg)   SpO2 98% Comment: RA  BMI 32.63 kg/m  Wt Readings from Last 3 Encounters:  03/25/17 178 lb 6.4 oz (80.9 kg)  09/22/16 177 lb 8 oz (80.5 kg)  03/10/16 173 lb (78.5  kg)   BP Readings from Last 3 Encounters:  03/25/17 125/70  09/22/16 102/70  03/10/16 114/78     Immunization History  Administered Date(s) Administered  . Influenza Split 09/14/2012  . Influenza,inj,Quad PF,36+ Mos 11/06/2013, 08/29/2015  . Pneumococcal Conjugate-13 11/06/2013  . Tdap 12/04/2014  . Zoster 03/10/2016    Health Maintenance  Topic Date Due  . PNEUMOCOCCAL POLYSACCHARIDE VACCINE (1) 04/06/1956  . OPHTHALMOLOGY EXAM  04/06/1964  . FOOT EXAM  03/10/2017  . HIV Screening  04/19/2017 (Originally 04/06/1969)  . INFLUENZA VACCINE  11/01/2017 (Originally 07/14/2017)  . MAMMOGRAM  04/13/2017  . HEMOGLOBIN A1C  09/24/2017  . COLONOSCOPY  02/25/2020  . TETANUS/TDAP  12/04/2024  . Hepatitis C Screening  Completed    Lab Results  Component Value Date   WBC 7.6 03/25/2017   HGB 14.0 03/25/2017   HCT 40.5 03/25/2017   PLT 247.0 03/25/2017   GLUCOSE 177 (H) 03/25/2017   CHOL 168 03/25/2017   TRIG 99.0 03/25/2017   HDL 43.30 03/25/2017   LDLCALC 105 (H) 03/25/2017   ALT 21 03/25/2017   AST 15 03/25/2017   NA 138 03/25/2017   K 4.1 03/25/2017   CL 104 03/25/2017   CREATININE 0.74 03/25/2017   BUN 16 03/25/2017   CO2 29 03/25/2017   TSH 1.15 03/25/2017   HGBA1C 7.3 (H) 03/25/2017   MICROALBUR <0.7 09/07/2016    Lab Results    Component Value Date   TSH 1.15 03/25/2017   Lab Results  Component Value Date   WBC 7.6 03/25/2017   HGB 14.0 03/25/2017   HCT 40.5 03/25/2017   MCV 88.8 03/25/2017   PLT 247.0 03/25/2017   Lab Results  Component Value Date   NA 138 03/25/2017   K 4.1 03/25/2017   CO2 29 03/25/2017   GLUCOSE 177 (H) 03/25/2017   BUN 16 03/25/2017   CREATININE 0.74 03/25/2017   BILITOT 0.6 03/25/2017   ALKPHOS 98 03/25/2017   AST 15 03/25/2017   ALT 21 03/25/2017   PROT 6.7 03/25/2017   ALBUMIN 4.1 03/25/2017   CALCIUM 9.1 03/25/2017   GFR 84.26 03/25/2017   Lab Results  Component Value Date   CHOL 168 03/25/2017   Lab Results  Component Value Date   HDL 43.30 03/25/2017   Lab Results  Component Value Date   LDLCALC 105 (H) 03/25/2017   Lab Results  Component Value Date   TRIG 99.0 03/25/2017   Lab Results  Component Value Date   CHOLHDL 4 03/25/2017   Lab Results  Component Value Date   HGBA1C 7.3 (H) 03/25/2017         Assessment & Plan:   Problem List Items Addressed This Visit    Diabetes mellitus type 2 in obese (HCC) (Chronic)    hgba1c acceptable, minimize simple carbs. Increase exercise as tolerated. Continue current meds      Relevant Orders   Hemoglobin A1c (Completed)   Annual physical exam    Patient encouraged to maintain heart healthy diet, regular exercise, adequate sleep. Consider daily probiotics. Take medications as prescribed. Labs checked today.       Relevant Orders   Hemoglobin A1c (Completed)   CBC (Completed)   Comprehensive metabolic panel (Completed)   Lipid panel (Completed)   TSH (Completed)   Vitamin B12 (Completed)   Hypothyroidism    On Levothyroxine, continue to monitor      Relevant Orders   TSH (Completed)   B12 deficiency    Taking oral Vitamin  B12 tab daily      Relevant Orders   Vitamin B12 (Completed)   Obesity    Encouraged DASH diet, decrease po intake and increase exercise as tolerated. Needs 7-8 hours  of sleep nightly. Avoid trans fats, eat small, frequent meals every 4-5 hours with lean proteins, complex carbs and healthy fats. Minimize simple carbs, referred to bariatric program       Other Visit Diagnoses    Hyperlipidemia, unspecified hyperlipidemia type    -  Primary   Relevant Orders   Lipid panel (Completed)      I have discontinued Ms. [place]'s azithromycin. I am also having her start on acetic acid. Additionally, I am having her maintain her estradiol, Cyanocobalamin (VITAMIN B 12 PO), FREESTYLE LITE, freestyle, glucose blood, ibuprofen, albuterol, loratadine, triamcinolone cream, FREESTYLE LITE, HYDROcodone-homatropine, LORazepam, levothyroxine, neomycin-polymyxin-hydrocortisone, metFORMIN, and olmesartan.  Meds ordered this encounter  Medications  . DISCONTD: neomycin-polymyxin-hydrocortisone (CORTISPORIN) 3.5-10000-1 otic suspension    Sig: PLACE 3 DROPS INTO BOTH EARS 3 TIMES DAILY.  Marland Kitchen acetic acid (VOSOL) 2 % otic solution    Sig: Place 4 drops into the right ear 3 (three) times daily.    Dispense:  15 mL    Refill:  1    CMA served as scribe during this visit. History, Physical and Plan performed by medical provider. Documentation and orders reviewed and attested to.  Tiffany Edge, MD

## 2017-03-28 ENCOUNTER — Encounter: Payer: Self-pay | Admitting: Family Medicine

## 2017-03-28 DIAGNOSIS — E669 Obesity, unspecified: Secondary | ICD-10-CM

## 2017-03-28 HISTORY — DX: Obesity, unspecified: E66.9

## 2017-03-28 NOTE — Assessment & Plan Note (Signed)
Encouraged DASH diet, decrease po intake and increase exercise as tolerated. Needs 7-8 hours of sleep nightly. Avoid trans fats, eat small, frequent meals every 4-5 hours with lean proteins, complex carbs and healthy fats. Minimize simple carbs, referred to bariatric program 

## 2017-04-27 ENCOUNTER — Ambulatory Visit (INDEPENDENT_AMBULATORY_CARE_PROVIDER_SITE_OTHER): Payer: BLUE CROSS/BLUE SHIELD | Admitting: Family Medicine

## 2017-04-27 ENCOUNTER — Encounter: Payer: Self-pay | Admitting: Family Medicine

## 2017-04-27 VITALS — BP 112/59 | HR 76 | Temp 98.2°F | Wt 180.2 lb

## 2017-04-27 DIAGNOSIS — N390 Urinary tract infection, site not specified: Secondary | ICD-10-CM | POA: Diagnosis not present

## 2017-04-27 DIAGNOSIS — M545 Low back pain: Secondary | ICD-10-CM

## 2017-04-27 DIAGNOSIS — R319 Hematuria, unspecified: Secondary | ICD-10-CM | POA: Diagnosis not present

## 2017-04-27 DIAGNOSIS — R350 Frequency of micturition: Secondary | ICD-10-CM | POA: Diagnosis not present

## 2017-04-27 LAB — POCT URINALYSIS DIPSTICK
BILIRUBIN UA: NEGATIVE
GLUCOSE UA: POSITIVE
Ketones, UA: NEGATIVE
NITRITE UA: NEGATIVE
Protein, UA: NEGATIVE
Spec Grav, UA: >=1.03 — AB (ref 1.010–1.025)
Urobilinogen, UA: NEGATIVE E.U./dL — AB
pH, UA: 6 (ref 5.0–8.0)

## 2017-04-27 MED ORDER — CIPROFLOXACIN HCL 250 MG PO TABS: 250.0000 mg | ORAL_TABLET | Freq: Two times a day (BID) | ORAL | 0 refills | Status: DC

## 2017-04-27 NOTE — Progress Notes (Signed)
Pre visit review using our clinic review tool, if applicable. No additional management support is needed unless otherwise documented below in the visit note. 

## 2017-04-27 NOTE — Patient Instructions (Signed)

## 2017-04-27 NOTE — Progress Notes (Signed)
Patient ID: Tiffany Mckenzie, female   DOB: Jan 11, 1956, 61 y.o.   MRN: [id number]   Subjective:  I acted as a Neurosurgeon for [facility], DO. Tiffany Mckenzie, [place]   Patient ID: Tiffany Mckenzie, female    DOB: [birth year], 61 y.o.   MRN: [id number]  Chief Complaint  Patient presents with  . Urinary Tract Infection    Urinary Tract Infection   This is a new problem. The current episode started 1 to 4 weeks ago. The problem occurs every urination. The problem has been unchanged. The quality of the pain is described as burning. The pain is at a severity of 5/10. There has been no fever. Associated symptoms include frequency and hematuria. Pertinent negatives include no vomiting. She has tried increased fluids for the symptoms. The treatment provided no relief.    Patient is in today for a possible UTI. Patient states that she has been experiencing lower back pain for the past week.For the past two days, Patient states that she has been urinary frequency, some burning. Noticed some blood in the urine this morning. Patient has a Hx of asthma, hypothyroidism, rosacea, overactive bladder, B12 deficiency, obesity, chronic back pain. Patient has no additional acute concerns noted at this time.  Patient Care Team: [name], MD as PCP - General (Family Medicine) Blondell Reveal, MD (Obstetrics and Gynecology) Joana Reamer Marveen Reeks, MD as Consulting Physician (Ophthalmology)   Past Medical History:  Diagnosis Date  . Allergic rhinitis   . Allergic state 03/03/2015  . Annual physical exam 02/09/2012  . Anxiety state 09/30/2016  . Arthritis   . Asthma   . Constipation 03/03/2015  . Diabetes mellitus type 2 in obese (HCC) 09/13/2012  . History of chicken pox   . History of hepatitis    age 58  . Hyperglycemia 09/13/2012  . Hypertension   . Hypothyroidism   . Left carotid bruit 03/11/2016  . Obesity 03/28/2017  . Overactive bladder 03/17/2013   Seeing urology  . Raynaud phenomenon 03/03/2015  . Type II or  unspecified type diabetes mellitus without mention of complication, uncontrolled 09/13/2012    Past Surgical History:  Procedure Laterality Date  . ABDOMINAL HYSTERECTOMY  2002  . TONSILLECTOMY      Family History  Problem Relation Age of Onset  . Alcohol abuse Father   . Cancer Father        lung  . Diabetes Father   . Arthritis Mother   . Heart disease Mother        Blockage & double bypass  . Hypertension Mother   . Diabetes Mother   . Colon cancer Paternal Grandmother   . Stroke Paternal Grandmother        maternal grandfather  . Breast cancer Maternal Grandmother   . Heart disease Maternal Grandfather   . Scoliosis Daughter   . Cancer Paternal Grandfather        lung    Social History   Social History  . Marital status: Married    Spouse name: N/A  . Number of children: N/A  . Years of education: N/A   Occupational History  . Not on file.   Social History Main Topics  . Smoking status: Never Smoker  . Smokeless tobacco: Never Used  . Alcohol use Yes     Comment: less than once a month, white wine or mixed drink  . Drug use: No  . Sexual activity: Not on file   Other Topics Concern  .  Not on file   Social History Narrative  . No narrative on file    Outpatient Medications Prior to Visit  Medication Sig Dispense Refill  . acetic acid (VOSOL) 2 % otic solution Place 4 drops into the right ear 3 (three) times daily. 15 mL 1  . albuterol (VENTOLIN HFA) 108 (90 Base) MCG/ACT inhaler Inhale 2 puffs into the lungs every 6 (six) hours as needed for wheezing or shortness of breath. 1 Inhaler 1  . Blood Glucose Monitoring Suppl (FREESTYLE LITE) DEVI Reported on 01/15/2016    . Cyanocobalamin (VITAMIN B 12 PO) Take 1,000 mcg by mouth daily.    Marland Kitchen estradiol (ESTRACE) 1 MG tablet Take 1 mg by mouth daily.    Marland Kitchen FREESTYLE LITE test strip CHECK BLOOD SUGAR ONCE DAILY AS NEEDED FOR SYMPTOMS 100 each 6  . glucose blood test strip Reported on 01/15/2016    .  HYDROcodone-homatropine (HYCODAN) 5-1.5 MG/5ML syrup Take 5 mLs by mouth every 8 (eight) hours as needed for cough. 180 mL 0  . ibuprofen (ADVIL,MOTRIN) 800 MG tablet Take 1 tablet (800 mg total) by mouth 3 (three) times daily. 21 tablet 0  . Lancets (FREESTYLE) lancets Reported on 01/15/2016    . levothyroxine (SYNTHROID, LEVOTHROID) 100 MCG tablet TAKE 1 TABLET DAILY BEFORE BREAKFAST 90 tablet 1  . loratadine (CLARITIN) 10 MG tablet Take 10 mg by mouth daily.    Marland Kitchen LORazepam (ATIVAN) 0.5 MG tablet Take 1 tablet (0.5 mg total) by mouth 2 (two) times daily as needed. 60 tablet 3  . metFORMIN (GLUCOPHAGE) 500 MG tablet TAKE 1 TABLET TWICE A DAY WITH MEALS 60 tablet 6  . neomycin-polymyxin-hydrocortisone (CORTISPORIN) 3.5-10000-1 otic suspension PLACE 3 DROPS INTO BOTH EARS 3 TIMES DAILY. 10 mL 0  . olmesartan (BENICAR) 20 MG tablet TAKE 1/2 TABLET DAILY 45 tablet 3  . triamcinolone cream (KENALOG) 0.1 % Apply 1 application topically 2 (two) times daily. 30 g 0   No facility-administered medications prior to visit.     Allergies  Allergen Reactions  . Codeine Nausea And Vomiting    vomiting  . Clarithromycin Rash    Review of Systems  Constitutional: Negative for fever and malaise/fatigue.  HENT: Negative for congestion.   Eyes: Negative for blurred vision.  Respiratory: Negative for cough and shortness of breath.   Cardiovascular: Negative for chest pain, palpitations and leg swelling.  Gastrointestinal: Negative for vomiting.  Genitourinary: Positive for frequency and hematuria.  Musculoskeletal: Negative for back pain.  Skin: Negative for rash.  Neurological: Negative for loss of consciousness and headaches.       Objective:    Physical Exam  Constitutional: She is oriented to person, place, and time. She appears well-developed and well-nourished. No distress.  HENT:  Head: Normocephalic and atraumatic.  Eyes: Conjunctivae are normal.  Neck: Normal range of motion. No  thyromegaly present.  Cardiovascular: Normal rate and regular rhythm.   Pulmonary/Chest: Effort normal and breath sounds normal. She has no wheezes.  Abdominal: Soft. Bowel sounds are normal. There is no tenderness.  Musculoskeletal: She exhibits no edema or deformity.  Neurological: She is alert and oriented to person, place, and time.  Skin: Skin is warm and dry. She is not diaphoretic.  Psychiatric: She has a normal mood and affect.    BP (!) 112/59 (BP Location: Left Arm, Patient Position: Sitting, Cuff Size: Normal)   Pulse 76   Temp 98.2 F (36.8 C) (Oral)   Wt 180 lb 3.2 oz (81.7  kg)   SpO2 99% Comment: RA  BMI 32.96 kg/m  Wt Readings from Last 3 Encounters:  04/27/17 180 lb 3.2 oz (81.7 kg)  03/25/17 178 lb 6.4 oz (80.9 kg)  09/22/16 177 lb 8 oz (80.5 kg)   BP Readings from Last 3 Encounters:  04/27/17 (!) 112/59  03/25/17 125/70  09/22/16 102/70     Immunization History  Administered Date(s) Administered  . Influenza Split 09/14/2012  . Influenza,inj,Quad PF,36+ Mos 11/06/2013, 08/29/2015  . Pneumococcal Conjugate-13 11/06/2013  . Tdap 12/04/2014  . Zoster 03/10/2016    Health Maintenance  Topic Date Due  . PNEUMOCOCCAL POLYSACCHARIDE VACCINE (1) 04/06/1956  . OPHTHALMOLOGY EXAM  04/06/1964  . HIV Screening  04/06/1969  . FOOT EXAM  03/10/2017  . MAMMOGRAM  04/13/2017  . INFLUENZA VACCINE  11/01/2017 (Originally 07/14/2017)  . HEMOGLOBIN A1C  09/24/2017  . COLONOSCOPY  02/25/2020  . TETANUS/TDAP  12/04/2024  . Hepatitis C Screening  Completed    Lab Results  Component Value Date   WBC 7.6 03/25/2017   HGB 14.0 03/25/2017   HCT 40.5 03/25/2017   PLT 247.0 03/25/2017   GLUCOSE 177 (H) 03/25/2017   CHOL 168 03/25/2017   TRIG 99.0 03/25/2017   HDL 43.30 03/25/2017   LDLCALC 105 (H) 03/25/2017   ALT 21 03/25/2017   AST 15 03/25/2017   NA 138 03/25/2017   K 4.1 03/25/2017   CL 104 03/25/2017   CREATININE 0.74 03/25/2017   BUN 16 03/25/2017   CO2  29 03/25/2017   TSH 1.15 03/25/2017   HGBA1C 7.3 (H) 03/25/2017   MICROALBUR <0.7 09/07/2016    Lab Results  Component Value Date   TSH 1.15 03/25/2017   Lab Results  Component Value Date   WBC 7.6 03/25/2017   HGB 14.0 03/25/2017   HCT 40.5 03/25/2017   MCV 88.8 03/25/2017   PLT 247.0 03/25/2017   Lab Results  Component Value Date   NA 138 03/25/2017   K 4.1 03/25/2017   CO2 29 03/25/2017   GLUCOSE 177 (H) 03/25/2017   BUN 16 03/25/2017   CREATININE 0.74 03/25/2017   BILITOT 0.6 03/25/2017   ALKPHOS 98 03/25/2017   AST 15 03/25/2017   ALT 21 03/25/2017   PROT 6.7 03/25/2017   ALBUMIN 4.1 03/25/2017   CALCIUM 9.1 03/25/2017   GFR 84.26 03/25/2017   Lab Results  Component Value Date   CHOL 168 03/25/2017   Lab Results  Component Value Date   HDL 43.30 03/25/2017   Lab Results  Component Value Date   LDLCALC 105 (H) 03/25/2017   Lab Results  Component Value Date   TRIG 99.0 03/25/2017   Lab Results  Component Value Date   CHOLHDL 4 03/25/2017   Lab Results  Component Value Date   HGBA1C 7.3 (H) 03/25/2017         Assessment & Plan:   Problem List Items Addressed This Visit    None    Visit Diagnoses    Urinary tract infection with hematuria, site unspecified    -  Primary   Relevant Medications   ciprofloxacin (CIPRO) 250 MG tablet   Other Relevant Orders   POCT Urinalysis Dipstick (Completed)   Urine Culture   Urinary frequency       Relevant Orders   POCT Urinalysis Dipstick (Completed)   Urine Culture   Low back pain, unspecified back pain laterality, unspecified chronicity, with sciatica presence unspecified       Relevant Orders  POCT Urinalysis Dipstick (Completed)   Urine Culture      I am having Tiffany Mckenzie start on ciprofloxacin. I am also having her maintain her estradiol, Cyanocobalamin (VITAMIN B 12 PO), FREESTYLE LITE, freestyle, glucose blood, ibuprofen, albuterol, loratadine, triamcinolone cream, FREESTYLE LITE,  HYDROcodone-homatropine, LORazepam, levothyroxine, neomycin-polymyxin-hydrocortisone, metFORMIN, olmesartan, and acetic acid.  Meds ordered this encounter  Medications  . ciprofloxacin (CIPRO) 250 MG tablet    Sig: Take 1 tablet (250 mg total) by mouth 2 (two) times daily.    Dispense:  6 tablet    Refill:  0    CMA served as scribe during this visit. History, Physical and Plan performed by medical provider. Documentation and orders reviewed and attested to.  Donato SchultzYvonne R Lowne Chase, DO

## 2017-04-27 NOTE — Progress Notes (Signed)
Subjective:    Patient ID: Tiffany Mckenzie, female    DOB: 1956/03/15, 61 y.o.   MRN: [id number]  Chief Complaint  Patient presents with  . Urinary Tract Infection    HPI Patient is in today for uti symptoms of urgency, blood in urine and some dysuria.  Symptoms x1 day   Past Medical History:  Diagnosis Date  . Allergic rhinitis   . Allergic state 03/03/2015  . Annual physical exam 02/09/2012  . Anxiety state 09/30/2016  . Arthritis   . Asthma   . Constipation 03/03/2015  . Diabetes mellitus type 2 in obese (HCC) 09/13/2012  . History of chicken pox   . History of hepatitis    age 89  . Hyperglycemia 09/13/2012  . Hypertension   . Hypothyroidism   . Left carotid bruit 03/11/2016  . Obesity 03/28/2017  . Overactive bladder 03/17/2013   Seeing urology  . Raynaud phenomenon 03/03/2015  . Type II or unspecified type diabetes mellitus without mention of complication, uncontrolled 09/13/2012    Past Surgical History:  Procedure Laterality Date  . ABDOMINAL HYSTERECTOMY  2002  . TONSILLECTOMY      Family History  Problem Relation Age of Onset  . Alcohol abuse Father   . Cancer Father        lung  . Diabetes Father   . Arthritis Mother   . Heart disease Mother        Blockage & double bypass  . Hypertension Mother   . Diabetes Mother   . Colon cancer Paternal Grandmother   . Stroke Paternal Grandmother        maternal grandfather  . Breast cancer Maternal Grandmother   . Heart disease Maternal Grandfather   . Scoliosis Daughter   . Cancer Paternal Grandfather        lung    Social History   Social History  . Marital status: Married    Spouse name: N/A  . Number of children: N/A  . Years of education: N/A   Occupational History  . Not on file.   Social History Main Topics  . Smoking status: Never Smoker  . Smokeless tobacco: Never Used  . Alcohol use Yes     Comment: less than once a month, white wine or mixed drink  . Drug use: No  . Sexual activity: Not on  file   Other Topics Concern  . Not on file   Social History Narrative  . No narrative on file    Outpatient Medications Prior to Visit  Medication Sig Dispense Refill  . acetic acid (VOSOL) 2 % otic solution Place 4 drops into the right ear 3 (three) times daily. 15 mL 1  . albuterol (VENTOLIN HFA) 108 (90 Base) MCG/ACT inhaler Inhale 2 puffs into the lungs every 6 (six) hours as needed for wheezing or shortness of breath. 1 Inhaler 1  . Blood Glucose Monitoring Suppl (FREESTYLE LITE) DEVI Reported on 01/15/2016    . Cyanocobalamin (VITAMIN B 12 PO) Take 1,000 mcg by mouth daily.    Marland Kitchen. estradiol (ESTRACE) 1 MG tablet Take 1 mg by mouth daily.    Marland Kitchen. FREESTYLE LITE test strip CHECK BLOOD SUGAR ONCE DAILY AS NEEDED FOR SYMPTOMS 100 each 6  . glucose blood test strip Reported on 01/15/2016    . HYDROcodone-homatropine (HYCODAN) 5-1.5 MG/5ML syrup Take 5 mLs by mouth every 8 (eight) hours as needed for cough. 180 mL 0  . ibuprofen (ADVIL,MOTRIN) 800 MG tablet Take 1  tablet (800 mg total) by mouth 3 (three) times daily. 21 tablet 0  . Lancets (FREESTYLE) lancets Reported on 01/15/2016    . levothyroxine (SYNTHROID, LEVOTHROID) 100 MCG tablet TAKE 1 TABLET DAILY BEFORE BREAKFAST 90 tablet 1  . loratadine (CLARITIN) 10 MG tablet Take 10 mg by mouth daily.    Marland Kitchen LORazepam (ATIVAN) 0.5 MG tablet Take 1 tablet (0.5 mg total) by mouth 2 (two) times daily as needed. 60 tablet 3  . metFORMIN (GLUCOPHAGE) 500 MG tablet TAKE 1 TABLET TWICE A DAY WITH MEALS 60 tablet 6  . neomycin-polymyxin-hydrocortisone (CORTISPORIN) 3.5-10000-1 otic suspension PLACE 3 DROPS INTO BOTH EARS 3 TIMES DAILY. 10 mL 0  . olmesartan (BENICAR) 20 MG tablet TAKE 1/2 TABLET DAILY 45 tablet 3  . triamcinolone cream (KENALOG) 0.1 % Apply 1 application topically 2 (two) times daily. 30 g 0   No facility-administered medications prior to visit.     Allergies  Allergen Reactions  . Codeine Nausea And Vomiting    vomiting  .  Clarithromycin Rash    Review of Systems  Constitutional: Negative for fever.  HENT: Negative for congestion.   Eyes: Negative for blurred vision.  Respiratory: Negative for cough.   Cardiovascular: Negative for chest pain and palpitations.  Gastrointestinal: Negative for vomiting.  Genitourinary: Positive for dysuria, frequency, hematuria and urgency. Negative for flank pain.  Musculoskeletal: Negative for back pain.  Skin: Negative for rash.  Neurological: Negative for loss of consciousness and headaches.       Objective:    Physical Exam  Constitutional: She is oriented to person, place, and time. She appears well-developed and well-nourished.  HENT:  Head: Normocephalic and atraumatic.  Eyes: Conjunctivae and EOM are normal.  Neck: Normal range of motion. Neck supple. No JVD present. Carotid bruit is not present. No thyromegaly present.  Cardiovascular: Normal rate, regular rhythm and normal heart sounds.   No murmur heard. Pulmonary/Chest: Effort normal and breath sounds normal. No respiratory distress. She has no wheezes. She has no rales. She exhibits no tenderness.  Musculoskeletal: She exhibits no edema.  Neurological: She is alert and oriented to person, place, and time.  Psychiatric: She has a normal mood and affect.  Nursing note and vitals reviewed.   BP (!) 112/59 (BP Location: Left Arm, Patient Position: Sitting, Cuff Size: Normal)   Pulse 76   Temp 98.2 F (36.8 C) (Oral)   Wt 180 lb 3.2 oz (81.7 kg)   SpO2 99% Comment: RA  BMI 32.96 kg/m  Wt Readings from Last 3 Encounters:  04/27/17 180 lb 3.2 oz (81.7 kg)  03/25/17 178 lb 6.4 oz (80.9 kg)  09/22/16 177 lb 8 oz (80.5 kg)     Lab Results  Component Value Date   WBC 7.6 03/25/2017   HGB 14.0 03/25/2017   HCT 40.5 03/25/2017   PLT 247.0 03/25/2017   GLUCOSE 177 (H) 03/25/2017   CHOL 168 03/25/2017   TRIG 99.0 03/25/2017   HDL 43.30 03/25/2017   LDLCALC 105 (H) 03/25/2017   ALT 21 03/25/2017     AST 15 03/25/2017   NA 138 03/25/2017   K 4.1 03/25/2017   CL 104 03/25/2017   CREATININE 0.74 03/25/2017   BUN 16 03/25/2017   CO2 29 03/25/2017   TSH 1.15 03/25/2017   HGBA1C 7.3 (H) 03/25/2017   MICROALBUR <0.7 09/07/2016    Lab Results  Component Value Date   TSH 1.15 03/25/2017   Lab Results  Component Value Date  WBC 7.6 03/25/2017   HGB 14.0 03/25/2017   HCT 40.5 03/25/2017   MCV 88.8 03/25/2017   PLT 247.0 03/25/2017   Lab Results  Component Value Date   NA 138 03/25/2017   K 4.1 03/25/2017   CO2 29 03/25/2017   GLUCOSE 177 (H) 03/25/2017   BUN 16 03/25/2017   CREATININE 0.74 03/25/2017   BILITOT 0.6 03/25/2017   ALKPHOS 98 03/25/2017   AST 15 03/25/2017   ALT 21 03/25/2017   PROT 6.7 03/25/2017   ALBUMIN 4.1 03/25/2017   CALCIUM 9.1 03/25/2017   GFR 84.26 03/25/2017   Lab Results  Component Value Date   CHOL 168 03/25/2017   Lab Results  Component Value Date   HDL 43.30 03/25/2017   Lab Results  Component Value Date   LDLCALC 105 (H) 03/25/2017   Lab Results  Component Value Date   TRIG 99.0 03/25/2017   Lab Results  Component Value Date   CHOLHDL 4 03/25/2017   Lab Results  Component Value Date   HGBA1C 7.3 (H) 03/25/2017       Assessment & Plan:   Problem List Items Addressed This Visit    None    Visit Diagnoses    Urinary tract infection with hematuria, site unspecified    -  Primary   Relevant Medications   ciprofloxacin (CIPRO) 250 MG tablet   Other Relevant Orders   POCT Urinalysis Dipstick (Completed)   Urine Culture   Urinary frequency       Relevant Orders   POCT Urinalysis Dipstick (Completed)   Urine Culture   Low back pain, unspecified back pain laterality, unspecified chronicity, with sciatica presence unspecified       Relevant Orders   POCT Urinalysis Dipstick (Completed)   Urine Culture      I am having Ms. Sobecki start on ciprofloxacin. I am also having her maintain her estradiol, Cyanocobalamin  (VITAMIN B 12 PO), FREESTYLE LITE, freestyle, glucose blood, ibuprofen, albuterol, loratadine, triamcinolone cream, FREESTYLE LITE, HYDROcodone-homatropine, LORazepam, levothyroxine, neomycin-polymyxin-hydrocortisone, metFORMIN, olmesartan, and acetic acid.  Meds ordered this encounter  Medications  . ciprofloxacin (CIPRO) 250 MG tablet    Sig: Take 1 tablet (250 mg total) by mouth 2 (two) times daily.    Dispense:  6 tablet    Refill:  0     Donato Schultz, DO

## 2017-04-29 LAB — URINE CULTURE

## 2017-05-08 ENCOUNTER — Other Ambulatory Visit: Payer: Self-pay | Admitting: Family Medicine

## 2017-05-17 LAB — HM MAMMOGRAPHY

## 2017-07-08 ENCOUNTER — Other Ambulatory Visit: Payer: Self-pay

## 2017-07-08 MED ORDER — METFORMIN HCL 500 MG PO TABS: 500.0000 mg | ORAL_TABLET | Freq: Two times a day (BID) | ORAL | 1 refills | Status: DC

## 2017-07-19 ENCOUNTER — Other Ambulatory Visit: Payer: Self-pay | Admitting: Family Medicine

## 2017-07-20 NOTE — Telephone Encounter (Signed)
Requesting:   Lorazepam Contract    09/22/2016 UDS   none Last OV    04/27/2017 Last Refill    #60 with 3 refills on 09/22/2016  Please Advise

## 2017-07-20 NOTE — Telephone Encounter (Signed)
Ok to refill the Lorazepam #60 with 2 rf but needs UDS

## 2017-07-22 NOTE — Telephone Encounter (Signed)
Printed/PCP signed/attached note to update UDS. Called the patient informed to come by the office to get hardcopy/update UDS. The patient verbalized understanding.

## 2017-08-18 ENCOUNTER — Encounter: Payer: Self-pay | Admitting: Family Medicine

## 2017-09-23 ENCOUNTER — Ambulatory Visit (INDEPENDENT_AMBULATORY_CARE_PROVIDER_SITE_OTHER): Payer: BLUE CROSS/BLUE SHIELD | Admitting: Family Medicine

## 2017-09-23 ENCOUNTER — Encounter: Payer: Self-pay | Admitting: Family Medicine

## 2017-09-23 VITALS — BP 100/60 | HR 81 | Temp 98.5°F | Resp 18 | Wt 178.4 lb

## 2017-09-23 DIAGNOSIS — E039 Hypothyroidism, unspecified: Secondary | ICD-10-CM | POA: Diagnosis not present

## 2017-09-23 DIAGNOSIS — Z79899 Other long term (current) drug therapy: Secondary | ICD-10-CM

## 2017-09-23 DIAGNOSIS — E669 Obesity, unspecified: Secondary | ICD-10-CM | POA: Diagnosis not present

## 2017-09-23 DIAGNOSIS — R059 Cough, unspecified: Secondary | ICD-10-CM

## 2017-09-23 DIAGNOSIS — E6609 Other obesity due to excess calories: Secondary | ICD-10-CM | POA: Diagnosis not present

## 2017-09-23 DIAGNOSIS — E1169 Type 2 diabetes mellitus with other specified complication: Secondary | ICD-10-CM | POA: Diagnosis not present

## 2017-09-23 DIAGNOSIS — E538 Deficiency of other specified B group vitamins: Secondary | ICD-10-CM

## 2017-09-23 DIAGNOSIS — R05 Cough: Secondary | ICD-10-CM | POA: Diagnosis not present

## 2017-09-23 DIAGNOSIS — E782 Mixed hyperlipidemia: Secondary | ICD-10-CM

## 2017-09-23 LAB — COMPREHENSIVE METABOLIC PANEL
ALT: 20 U/L (ref 0–35)
AST: 13 U/L (ref 0–37)
Albumin: 3.9 g/dL (ref 3.5–5.2)
Alkaline Phosphatase: 86 U/L (ref 39–117)
BILIRUBIN TOTAL: 0.7 mg/dL (ref 0.2–1.2)
BUN: 14 mg/dL (ref 6–23)
CO2: 30 meq/L (ref 19–32)
Calcium: 8.7 mg/dL (ref 8.4–10.5)
Chloride: 104 mEq/L (ref 96–112)
Creatinine, Ser: 0.77 mg/dL (ref 0.40–1.20)
GFR: 80.35 mL/min (ref >60.00)
GLUCOSE: 164 mg/dL — AB (ref 70–99)
Potassium: 3.9 mEq/L (ref 3.5–5.1)
SODIUM: 139 meq/L (ref 135–145)
TOTAL PROTEIN: 6.3 g/dL (ref 6.0–8.3)

## 2017-09-23 LAB — CBC WITH DIFFERENTIAL/PLATELET
Basophils Absolute: 0.1 10*3/uL (ref 0.0–0.1)
Basophils Relative: 1 % (ref 0.0–3.0)
EOS ABS: 0.2 10*3/uL (ref 0.0–0.7)
Eosinophils Relative: 3 % (ref 0.0–5.0)
HCT: 38.6 % (ref 36.0–46.0)
Hemoglobin: 13.1 g/dL (ref 12.0–15.0)
LYMPHS ABS: 1.5 10*3/uL (ref 0.7–4.0)
Lymphocytes Relative: 22.9 % (ref 12.0–46.0)
MCHC: 33.8 g/dL (ref 30.0–36.0)
MCV: 89.8 fl (ref 78.0–100.0)
MONOS PCT: 8.5 % (ref 3.0–12.0)
Monocytes Absolute: 0.6 10*3/uL (ref 0.1–1.0)
NEUTROS PCT: 64.6 % (ref 43.0–77.0)
Neutro Abs: 4.4 10*3/uL (ref 1.4–7.7)
Platelets: 265 10*3/uL (ref 150.0–400.0)
RBC: 4.3 Mil/uL (ref 3.87–5.11)
RDW: 12.8 % (ref 11.5–15.5)
WBC: 6.8 10*3/uL (ref 4.0–10.5)

## 2017-09-23 LAB — LIPID PANEL
CHOL/HDL RATIO: 4
Cholesterol: 164 mg/dL (ref 0–200)
HDL: 41.4 mg/dL (ref >39.00)
LDL CALC: 109 mg/dL — AB (ref 0–99)
NONHDL: 122.72
Triglycerides: 71 mg/dL (ref 0.0–149.0)
VLDL: 14.2 mg/dL (ref 0.0–40.0)

## 2017-09-23 LAB — VITAMIN B12: Vitamin B-12: 262 pg/mL (ref 211–911)

## 2017-09-23 LAB — HEMOGLOBIN A1C: Hgb A1c MFr Bld: 7.5 % — ABNORMAL HIGH (ref 4.6–6.5)

## 2017-09-23 LAB — TSH: TSH: 0.8 u[IU]/mL (ref 0.35–4.50)

## 2017-09-23 MED ORDER — HYDROCODONE-HOMATROPINE 5-1.5 MG/5ML PO SYRP: 5.0000 mL | ORAL_SOLUTION | Freq: Three times a day (TID) | ORAL | 0 refills | Status: DC | PRN

## 2017-09-23 NOTE — Progress Notes (Signed)
Subjective:  I acted as a Neurosurgeon for Dr. Abner Greenspan. Tiffany Mckenzie, [place]  Patient ID: Tiffany Mckenzie, female    DOB: 01-12-1956, 61 y.o.   MRN: [id number]  No chief complaint on file.   HPI  Patient is in today for 6 month follow up. She is following up on her DM and other medical concerns. Patiently presently has no acute concerns. No recent febrile illness or acute hospitalizations. Denies CP/palp/SOB/HA/congestion/fevers/GI or GU c/o. Taking meds as prescribed. Denies polyuria or polydipsia. Trying to maintain a heart healthy diet and blood sugars have been staying in the low 100s.  Patient Care Team: [name], MD as PCP - General (Family Medicine) Blondell Reveal, MD (Obstetrics and Gynecology) Joana Reamer Marveen Reeks, MD as Consulting Physician (Ophthalmology)   Past Medical History:  Diagnosis Date  . Allergic rhinitis   . Allergic state 03/03/2015  . Annual physical exam 02/09/2012  . Anxiety state 09/30/2016  . Arthritis   . Asthma   . Constipation 03/03/2015  . Diabetes mellitus type 2 in obese (HCC) 09/13/2012  . History of chicken pox   . History of hepatitis    age 2  . Hyperglycemia 09/13/2012  . Hypertension   . Hypothyroidism   . Left carotid bruit 03/11/2016  . Obesity 03/28/2017  . Overactive bladder 03/17/2013   Seeing urology  . Raynaud phenomenon 03/03/2015  . Type II or unspecified type diabetes mellitus without mention of complication, uncontrolled 09/13/2012    Past Surgical History:  Procedure Laterality Date  . ABDOMINAL HYSTERECTOMY  2002  . TONSILLECTOMY      Family History  Problem Relation Age of Onset  . Alcohol abuse Father   . Cancer Father        lung  . Diabetes Father   . Arthritis Mother   . Heart disease Mother        Blockage & double bypass  . Hypertension Mother   . Diabetes Mother   . Colon cancer Paternal Grandmother   . Stroke Paternal Grandmother        maternal grandfather  . Breast cancer Maternal Grandmother   . Heart disease  Maternal Grandfather   . Scoliosis Daughter   . Cancer Paternal Grandfather        lung    Social History   Social History  . Marital status: Married    Spouse name: N/A  . Number of children: N/A  . Years of education: N/A   Occupational History  . Not on file.   Social History Main Topics  . Smoking status: Never Smoker  . Smokeless tobacco: Never Used  . Alcohol use Yes     Comment: less than once a month, white wine or mixed drink  . Drug use: No  . Sexual activity: Not on file   Other Topics Concern  . Not on file   Social History Narrative  . No narrative on file    Outpatient Medications Prior to Visit  Medication Sig Dispense Refill  . acetic acid (VOSOL) 2 % otic solution Place 4 drops into the right ear 3 (three) times daily. 15 mL 1  . albuterol (VENTOLIN HFA) 108 (90 Base) MCG/ACT inhaler Inhale 2 puffs into the lungs every 6 (six) hours as needed for wheezing or shortness of breath. 1 Inhaler 1  . Blood Glucose Monitoring Suppl (FREESTYLE LITE) DEVI Reported on 01/15/2016    . Cyanocobalamin (VITAMIN B 12 PO) Take 1,000 mcg by mouth daily.    Marland Kitchen  estradiol (ESTRACE) 1 MG tablet Take 1 mg by mouth daily.    Marland Kitchen FREESTYLE LITE test strip CHECK BLOOD SUGAR ONCE DAILY AS NEEDED FOR SYMPTOMS 100 each 6  . glucose blood test strip Reported on 01/15/2016    . ibuprofen (ADVIL,MOTRIN) 800 MG tablet Take 1 tablet (800 mg total) by mouth 3 (three) times daily. 21 tablet 0  . Lancets (FREESTYLE) lancets Reported on 01/15/2016    . levothyroxine (SYNTHROID, LEVOTHROID) 100 MCG tablet TAKE 1 TABLET DAILY BEFORE BREAKFAST 90 tablet 1  . loratadine (CLARITIN) 10 MG tablet Take 10 mg by mouth daily.    Marland Kitchen LORazepam (ATIVAN) 0.5 MG tablet TAKE 1 TABLET TWICE A DAY AS NEEDED 60 tablet 2  . metFORMIN (GLUCOPHAGE) 500 MG tablet Take 1 tablet (500 mg total) by mouth 2 (two) times daily with a meal. 180 tablet 1  . neomycin-polymyxin-hydrocortisone (CORTISPORIN) 3.5-10000-1 otic suspension  PLACE 3 DROPS INTO BOTH EARS 3 TIMES DAILY. 10 mL 0  . olmesartan (BENICAR) 20 MG tablet TAKE 1/2 TABLET DAILY 45 tablet 3  . triamcinolone cream (KENALOG) 0.1 % Apply 1 application topically 2 (two) times daily. 30 g 0  . ciprofloxacin (CIPRO) 250 MG tablet Take 1 tablet (250 mg total) by mouth 2 (two) times daily. 6 tablet 0  . HYDROcodone-homatropine (HYCODAN) 5-1.5 MG/5ML syrup Take 5 mLs by mouth every 8 (eight) hours as needed for cough. 180 mL 0   No facility-administered medications prior to visit.     Allergies  Allergen Reactions  . Codeine Nausea And Vomiting    vomiting  . Clarithromycin Rash    Review of Systems  Constitutional: Negative for fever and malaise/fatigue.  HENT: Negative for congestion.   Eyes: Negative for blurred vision.  Respiratory: Negative for cough and shortness of breath.   Cardiovascular: Negative for chest pain, palpitations and leg swelling.  Gastrointestinal: Negative for vomiting.  Musculoskeletal: Negative for back pain.  Skin: Negative for rash.  Neurological: Negative for loss of consciousness and headaches.       Objective:    Physical Exam  Constitutional: She is oriented to person, place, and time. She appears well-developed and well-nourished. No distress.  HENT:  Head: Normocephalic and atraumatic.  Eyes: Conjunctivae are normal.  Neck: Normal range of motion. No thyromegaly present.  Cardiovascular: Normal rate and regular rhythm.   Pulmonary/Chest: Effort normal and breath sounds normal. She has no wheezes.  Abdominal: Soft. Bowel sounds are normal. There is no tenderness.  Musculoskeletal: Normal range of motion. She exhibits no edema or deformity.  Neurological: She is alert and oriented to person, place, and time.  Skin: Skin is warm and dry. She is not diaphoretic.  Psychiatric: She has a normal mood and affect.    BP 100/60 (BP Location: Left Arm, Patient Position: Sitting, Cuff Size: Normal)   Pulse 81   Temp 98.5  F (36.9 C) (Oral)   Resp 18   Wt 178 lb 6.4 oz (80.9 kg)   SpO2 98%   BMI 32.63 kg/m  Wt Readings from Last 3 Encounters:  09/23/17 178 lb 6.4 oz (80.9 kg)  04/27/17 180 lb 3.2 oz (81.7 kg)  03/25/17 178 lb 6.4 oz (80.9 kg)   BP Readings from Last 3 Encounters:  09/23/17 100/60  04/27/17 (!) 112/59  03/25/17 125/70     Immunization History  Administered Date(s) Administered  . Influenza Split 09/14/2012  . Influenza,inj,Quad PF,6+ Mos 11/06/2013, 08/29/2015  . Pneumococcal Conjugate-13 11/06/2013  . Tdap  12/04/2014  . Zoster 03/10/2016    Health Maintenance  Topic Date Due  . PNEUMOCOCCAL POLYSACCHARIDE VACCINE (1) 04/06/1956  . OPHTHALMOLOGY EXAM  04/06/1964  . HIV Screening  04/06/1969  . FOOT EXAM  03/10/2017  . MAMMOGRAM  04/13/2017  . INFLUENZA VACCINE  11/01/2017 (Originally 07/14/2017)  . HEMOGLOBIN A1C  03/24/2018  . COLONOSCOPY  02/25/2020  . TETANUS/TDAP  12/04/2024  . Hepatitis C Screening  Completed    Lab Results  Component Value Date   WBC 6.8 09/23/2017   HGB 13.1 09/23/2017   HCT 38.6 09/23/2017   PLT 265.0 09/23/2017   GLUCOSE 164 (H) 09/23/2017   CHOL 164 09/23/2017   TRIG 71.0 09/23/2017   HDL 41.40 09/23/2017   LDLCALC 109 (H) 09/23/2017   ALT 20 09/23/2017   AST 13 09/23/2017   NA 139 09/23/2017   K 3.9 09/23/2017   CL 104 09/23/2017   CREATININE 0.77 09/23/2017   BUN 14 09/23/2017   CO2 30 09/23/2017   TSH 0.80 09/23/2017   HGBA1C 7.5 (H) 09/23/2017   MICROALBUR <0.7 09/07/2016    Lab Results  Component Value Date   TSH 0.80 09/23/2017   Lab Results  Component Value Date   WBC 6.8 09/23/2017   HGB 13.1 09/23/2017   HCT 38.6 09/23/2017   MCV 89.8 09/23/2017   PLT 265.0 09/23/2017   Lab Results  Component Value Date   NA 139 09/23/2017   K 3.9 09/23/2017   CO2 30 09/23/2017   GLUCOSE 164 (H) 09/23/2017   BUN 14 09/23/2017   CREATININE 0.77 09/23/2017   BILITOT 0.7 09/23/2017   ALKPHOS 86 09/23/2017   AST 13  09/23/2017   ALT 20 09/23/2017   PROT 6.3 09/23/2017   ALBUMIN 3.9 09/23/2017   CALCIUM 8.7 09/23/2017   GFR 80.35 09/23/2017   Lab Results  Component Value Date   CHOL 164 09/23/2017   Lab Results  Component Value Date   HDL 41.40 09/23/2017   Lab Results  Component Value Date   LDLCALC 109 (H) 09/23/2017   Lab Results  Component Value Date   TRIG 71.0 09/23/2017   Lab Results  Component Value Date   CHOLHDL 4 09/23/2017   Lab Results  Component Value Date   HGBA1C 7.5 (H) 09/23/2017         Assessment & Plan:   Problem List Items Addressed This Visit    Diabetes mellitus type 2 in obese (HCC) (Chronic)    hgba1c acceptable but trending up, minimize simple carbs. Increase exercise as tolerated. Continue current meds but increase the Metformin to 1000 in am and 500 in pm. In past 2 weeks lowest was 100 and highest was 161 fasting      Relevant Orders   Comprehensive metabolic panel (Completed)   Hemoglobin A1c (Completed)   Hypothyroidism    On Levothyroxine, continue to monitor      Relevant Orders   TSH (Completed)   B12 deficiency    Not taking her Vitamin B 12 1000 mcg daily      Relevant Orders   Vitamin B12 (Completed)   Obesity    Encouraged DASH diet, decrease po intake and increase exercise as tolerated. Needs 7-8 hours of sleep nightly. Avoid trans fats, eat small, frequent meals every 4-5 hours with lean proteins, complex carbs and healthy fats. Minimize simple carbs, GMO foods.       Other Visit Diagnoses    Cough    -  Primary  Relevant Orders   CBC with Differential/Platelet (Completed)   Mixed hyperlipidemia       Relevant Orders   Lipid panel (Completed)   High risk medication use       Relevant Orders   Pain Mgmt, Profile 8 w/Conf, U      I have discontinued Ms. Byas's ciprofloxacin. I am also having her maintain her estradiol, Cyanocobalamin (VITAMIN B 12 PO), FREESTYLE LITE, freestyle, glucose blood, ibuprofen,  albuterol, loratadine, triamcinolone cream, FREESTYLE LITE, neomycin-polymyxin-hydrocortisone, olmesartan, acetic acid, levothyroxine, metFORMIN, LORazepam, and HYDROcodone-homatropine.  Meds ordered this encounter  Medications  . HYDROcodone-homatropine (HYCODAN) 5-1.5 MG/5ML syrup    Sig: Take 5 mLs by mouth every 8 (eight) hours as needed for cough.    Dispense:  180 mL    Refill:  0    CMA served as scribe during this visit. History, Physical and Plan performed by medical provider. Documentation and orders reviewed and attested to.  Danise Edge, MD

## 2017-09-23 NOTE — Assessment & Plan Note (Signed)
>>  ASSESSMENT AND PLAN FOR TYPE 2 DIABETES MELLITUS WITH OBESITY WRITTEN ON 09/24/2017  2:07 PM BY BLYTH, STACEY A, MD  hgba1c acceptable but trending up, minimize simple carbs. Increase exercise as tolerated. Continue current meds but increase the Metformin  to 1000 in am and 500 in pm. In past 2 weeks lowest was 100 and highest was 161 fasting

## 2017-09-23 NOTE — Assessment & Plan Note (Addendum)
hgba1c acceptable but trending up, minimize simple carbs. Increase exercise as tolerated. Continue current meds but increase the Metformin to 1000 in am and 500 in pm. In past 2 weeks lowest was 100 and highest was 161 fasting

## 2017-09-23 NOTE — Assessment & Plan Note (Signed)
Not taking her Vitamin B 12 1000 mcg daily

## 2017-09-23 NOTE — Assessment & Plan Note (Signed)
On Levothyroxine, continue to monitor 

## 2017-09-23 NOTE — Assessment & Plan Note (Signed)
Encouraged DASH diet, decrease po intake and increase exercise as tolerated. Needs 7-8 hours of sleep nightly. Avoid trans fats, eat small, frequent meals every 4-5 hours with lean proteins, complex carbs and healthy fats. Minimize simple carbs, GMO foods. 

## 2017-09-23 NOTE — Patient Instructions (Addendum)
Vitamin B12 1000 mcg tab, 1 tab Sublingual daily Carbohydrate Counting for Diabetes Mellitus, Adult Carbohydrate counting is a method for keeping track of how many carbohydrates you eat. Eating carbohydrates naturally increases the amount of sugar (glucose) in the blood. Counting how many carbohydrates you eat helps keep your blood glucose within normal limits, which helps you manage your diabetes (diabetes mellitus). It is important to know how many carbohydrates you can safely have in each meal. This is different for every person. A diet and nutrition specialist (registered dietitian) can help you make a meal plan and calculate how many carbohydrates you should have at each meal and snack. Carbohydrates are found in the following foods:  Grains, such as breads and cereals.  Dried beans and soy products.  Starchy vegetables, such as potatoes, peas, and corn.  Fruit and fruit juices.  Milk and yogurt.  Sweets and snack foods, such as cake, cookies, candy, chips, and soft drinks.  How do I count carbohydrates? There are two ways to count carbohydrates in food. You can use either of the methods or a combination of both. Reading "Nutrition Facts" on packaged food The "Nutrition Facts" list is included on the labels of almost all packaged foods and beverages in the [place] It includes:  The serving size.  Information about nutrients in each serving, including the grams (g) of carbohydrate per serving.  To use the "Nutrition Facts":  Decide how many servings you will have.  Multiply the number of servings by the number of carbohydrates per serving.  The resulting number is the total amount of carbohydrates that you will be having.  Learning standard serving sizes of other foods When you eat foods containing carbohydrates that are not packaged or do not include "Nutrition Facts" on the label, you need to measure the servings in order to count the amount of carbohydrates:  Measure the  foods that you will eat with a food scale or measuring cup, if needed.  Decide how many standard-size servings you will eat.  Multiply the number of servings by 15. Most carbohydrate-rich foods have about 15 g of carbohydrates per serving. ? For example, if you eat 8 oz (170 g) of strawberries, you will have eaten 2 servings and 30 g of carbohydrates (2 servings x 15 g = 30 g).  For foods that have more than one food mixed, such as soups and casseroles, you must count the carbohydrates in each food that is included.  The following list contains standard serving sizes of common carbohydrate-rich foods. Each of these servings has about 15 g of carbohydrates:   hamburger bun or  English muffin.   oz (15 mL) syrup.   oz (14 g) jelly.  1 slice of bread.  1 six-inch tortilla.  3 oz (85 g) cooked rice or pasta.  4 oz (113 g) cooked dried beans.  4 oz (113 g) starchy vegetable, such as peas, corn, or potatoes.  4 oz (113 g) hot cereal.  4 oz (113 g) mashed potatoes or  of a large baked potato.  4 oz (113 g) canned or frozen fruit.  4 oz (120 mL) fruit juice.  4-6 crackers.  6 chicken nuggets.  6 oz (170 g) unsweetened dry cereal.  6 oz (170 g) plain fat-free yogurt or yogurt sweetened with artificial sweeteners.  8 oz (240 mL) milk.  8 oz (170 g) fresh fruit or one small piece of fruit.  24 oz (680 g) popped popcorn.  Example of carbohydrate counting  Sample meal  3 oz (85 g) chicken breast.  6 oz (170 g) brown rice.  4 oz (113 g) corn.  8 oz (240 mL) milk.  8 oz (170 g) strawberries with sugar-free whipped topping. Carbohydrate calculation 1. Identify the foods that contain carbohydrates: ? Rice. ? Corn. ? Milk. ? Strawberries. 2. Calculate how many servings you have of each food: ? 2 servings rice. ? 1 serving corn. ? 1 serving milk. ? 1 serving strawberries. 3. Multiply each number of servings by 15 g: ? 2 servings rice x 15 g = 30 g. ? 1  serving corn x 15 g = 15 g. ? 1 serving milk x 15 g = 15 g. ? 1 serving strawberries x 15 g = 15 g. 4. Add together all of the amounts to find the total grams of carbohydrates eaten: ? 30 g + 15 g + 15 g + 15 g = 75 g of carbohydrates total. This information is not intended to replace advice given to you by your health care provider. Make sure you discuss any questions you have with your health care provider. Document Released: 11/30/2005 Document Revised: 06/19/2016 Document Reviewed: 05/13/2016 Elsevier Interactive Patient Education  [employer].

## 2017-09-27 ENCOUNTER — Telehealth: Payer: Self-pay | Admitting: *Deleted

## 2017-09-27 NOTE — Telephone Encounter (Signed)
Received Medical records from [facility]; forwarded to provider/SLS 10/15

## 2017-09-28 LAB — PAIN MGMT, PROFILE 8 W/CONF, U
6 ACETYLMORPHINE: NEGATIVE ng/mL (ref <10)
AMPHETAMINES: NEGATIVE ng/mL (ref <500)
Alcohol Metabolites: NEGATIVE ng/mL (ref <500)
BENZODIAZEPINES: NEGATIVE ng/mL (ref <100)
BUPRENORPHINE, URINE: NEGATIVE ng/mL (ref <5)
CREATININE: 66.8 mg/dL
Cocaine Metabolite: NEGATIVE ng/mL (ref <150)
Codeine: NEGATIVE ng/mL (ref <50)
Hydrocodone: 122 ng/mL — ABNORMAL HIGH (ref <50)
Hydromorphone: 110 ng/mL — ABNORMAL HIGH (ref <50)
MDMA: NEGATIVE ng/mL (ref <500)
MORPHINE: NEGATIVE ng/mL (ref <50)
Marijuana Metabolite: NEGATIVE ng/mL (ref <20)
NORHYDROCODONE: 268 ng/mL — AB (ref <50)
OPIATES: POSITIVE ng/mL — AB (ref <100)
Oxidant: NEGATIVE ug/mL (ref <200)
Oxycodone: NEGATIVE ng/mL (ref <100)
PH: 6.39 (ref 4.5–9.0)

## 2017-09-30 ENCOUNTER — Encounter: Payer: Self-pay | Admitting: Family Medicine

## 2017-11-03 ENCOUNTER — Other Ambulatory Visit: Payer: Self-pay | Admitting: Family Medicine

## 2018-01-03 ENCOUNTER — Other Ambulatory Visit: Payer: Self-pay | Admitting: Family Medicine

## 2018-01-05 ENCOUNTER — Telehealth: Payer: Self-pay | Admitting: Family Medicine

## 2018-01-05 NOTE — Telephone Encounter (Signed)
Copied from CRM [phone]. Topic: Quick Communication - See Telephone Encounter >> Jan 05, 2018  3:27 PM Rudi CocoLathan, Gloriann Riede M, NT wrote: CRM for notification. See Telephone encounter for:   01/05/18. Pt. Called to let Dr. Abner GreenspanBlyth know that Insurance will no longer cover her rx. For freestyle lite test strips. Pt. Needs to know what else to do. Pt. Can be reached at [phone]

## 2018-01-06 NOTE — Telephone Encounter (Signed)
know that Insurance will no longer cover her rx. For freestyle lite test strips. Pt. Needs to know what else to do   Please advise

## 2018-01-06 NOTE — Telephone Encounter (Signed)
Easiest way is to have her ask her pharmacist what her insurance will cover for her now. Then we can prescribe her a new glucometer and test strips that are cheaper with her insurance.

## 2018-01-06 NOTE — Telephone Encounter (Signed)
Left message for patient to give us the name of the test strips that her insurance will cover

## 2018-01-21 ENCOUNTER — Other Ambulatory Visit: Payer: Self-pay | Admitting: Family Medicine

## 2018-02-04 ENCOUNTER — Other Ambulatory Visit: Payer: Self-pay

## 2018-02-04 MED ORDER — GLUCOSE BLOOD VI STRP: ORAL_STRIP | 0 refills | Status: DC

## 2018-03-24 ENCOUNTER — Encounter: Payer: BLUE CROSS/BLUE SHIELD | Admitting: Family Medicine

## 2018-03-31 ENCOUNTER — Encounter: Payer: BLUE CROSS/BLUE SHIELD | Admitting: Family Medicine

## 2018-05-05 ENCOUNTER — Other Ambulatory Visit: Payer: Self-pay | Admitting: Family Medicine

## 2018-05-23 ENCOUNTER — Ambulatory Visit (INDEPENDENT_AMBULATORY_CARE_PROVIDER_SITE_OTHER): Payer: BLUE CROSS/BLUE SHIELD | Admitting: Family Medicine

## 2018-05-23 ENCOUNTER — Encounter

## 2018-05-23 ENCOUNTER — Encounter: Payer: BLUE CROSS/BLUE SHIELD | Admitting: Family Medicine

## 2018-05-23 ENCOUNTER — Encounter: Payer: Self-pay | Admitting: Family Medicine

## 2018-05-23 VITALS — BP 121/71 | HR 84 | Temp 98.6°F | Resp 18 | Wt 177.8 lb

## 2018-05-23 DIAGNOSIS — E785 Hyperlipidemia, unspecified: Secondary | ICD-10-CM

## 2018-05-23 DIAGNOSIS — H40023 Open angle with borderline findings, high risk, bilateral: Secondary | ICD-10-CM | POA: Diagnosis not present

## 2018-05-23 DIAGNOSIS — E1169 Type 2 diabetes mellitus with other specified complication: Secondary | ICD-10-CM

## 2018-05-23 DIAGNOSIS — H6060 Unspecified chronic otitis externa, unspecified ear: Secondary | ICD-10-CM

## 2018-05-23 DIAGNOSIS — Z7984 Long term (current) use of oral hypoglycemic drugs: Secondary | ICD-10-CM | POA: Diagnosis not present

## 2018-05-23 DIAGNOSIS — H0100A Unspecified blepharitis right eye, upper and lower eyelids: Secondary | ICD-10-CM | POA: Diagnosis not present

## 2018-05-23 DIAGNOSIS — E119 Type 2 diabetes mellitus without complications: Secondary | ICD-10-CM

## 2018-05-23 DIAGNOSIS — E6609 Other obesity due to excess calories: Secondary | ICD-10-CM | POA: Diagnosis not present

## 2018-05-23 DIAGNOSIS — E669 Obesity, unspecified: Secondary | ICD-10-CM

## 2018-05-23 DIAGNOSIS — E538 Deficiency of other specified B group vitamins: Secondary | ICD-10-CM

## 2018-05-23 DIAGNOSIS — R252 Cramp and spasm: Secondary | ICD-10-CM

## 2018-05-23 DIAGNOSIS — E039 Hypothyroidism, unspecified: Secondary | ICD-10-CM | POA: Diagnosis not present

## 2018-05-23 DIAGNOSIS — M545 Low back pain: Secondary | ICD-10-CM | POA: Diagnosis not present

## 2018-05-23 DIAGNOSIS — G8929 Other chronic pain: Secondary | ICD-10-CM | POA: Diagnosis not present

## 2018-05-23 DIAGNOSIS — Z0000 Encounter for general adult medical examination without abnormal findings: Secondary | ICD-10-CM

## 2018-05-23 LAB — LIPID PANEL
CHOL/HDL RATIO: 4
Cholesterol: 169 mg/dL (ref 0–200)
HDL: 46.4 mg/dL (ref >39.00)
LDL CALC: 102 mg/dL — AB (ref 0–99)
NONHDL: 122.63
TRIGLYCERIDES: 104 mg/dL (ref 0.0–149.0)
VLDL: 20.8 mg/dL (ref 0.0–40.0)

## 2018-05-23 LAB — CBC
HCT: 39.7 % (ref 36.0–46.0)
Hemoglobin: 13.8 g/dL (ref 12.0–15.0)
MCHC: 34.7 g/dL (ref 30.0–36.0)
MCV: 88.1 fl (ref 78.0–100.0)
Platelets: 243 10*3/uL (ref 150.0–400.0)
RBC: 4.51 Mil/uL (ref 3.87–5.11)
RDW: 13.1 % (ref 11.5–15.5)
WBC: 7.4 10*3/uL (ref 4.0–10.5)

## 2018-05-23 LAB — COMPREHENSIVE METABOLIC PANEL
ALT: 19 U/L (ref 0–35)
AST: 14 U/L (ref 0–37)
Albumin: 4.2 g/dL (ref 3.5–5.2)
Alkaline Phosphatase: 116 U/L (ref 39–117)
BUN: 13 mg/dL (ref 6–23)
CALCIUM: 9.6 mg/dL (ref 8.4–10.5)
CHLORIDE: 101 meq/L (ref 96–112)
CO2: 30 meq/L (ref 19–32)
CREATININE: 0.7 mg/dL (ref 0.40–1.20)
GFR: 89.5 mL/min (ref >60.00)
GLUCOSE: 164 mg/dL — AB (ref 70–99)
Potassium: 4.3 mEq/L (ref 3.5–5.1)
Sodium: 139 mEq/L (ref 135–145)
Total Bilirubin: 0.6 mg/dL (ref 0.2–1.2)
Total Protein: 6.5 g/dL (ref 6.0–8.3)

## 2018-05-23 LAB — MAGNESIUM: MAGNESIUM: 1.8 mg/dL (ref 1.5–2.5)

## 2018-05-23 LAB — VITAMIN B12: Vitamin B-12: 188 pg/mL — ABNORMAL LOW (ref 211–911)

## 2018-05-23 LAB — HEMOGLOBIN A1C: HEMOGLOBIN A1C: 7.9 % — AB (ref 4.6–6.5)

## 2018-05-23 MED ORDER — AMOXICILLIN-POT CLAVULANATE 875-125 MG PO TABS: 1.0000 | ORAL_TABLET | Freq: Two times a day (BID) | ORAL | 0 refills | Status: DC

## 2018-05-23 MED ORDER — GLUCOSE BLOOD VI STRP
ORAL_STRIP | 3 refills | Status: DC
Start: 2018-05-23 — End: 2018-11-24

## 2018-05-23 NOTE — Assessment & Plan Note (Signed)
Oral supplements

## 2018-05-23 NOTE — Assessment & Plan Note (Signed)
>>  ASSESSMENT AND PLAN FOR TYPE 2 DIABETES MELLITUS WITH OBESITY WRITTEN ON 05/23/2018 11:48 AM BY BLYTH, STACEY A, MD  hgba1c acceptable, minimize simple carbs. Increase exercise as tolerated.

## 2018-05-23 NOTE — Assessment & Plan Note (Signed)
Encouraged DASH diet, decrease po intake and increase exercise as tolerated. Needs 7-8 hours of sleep nightly. Avoid trans fats, eat small, frequent meals every 4-5 hours with lean proteins, complex carbs and healthy fats. Minimize simple carbs 

## 2018-05-23 NOTE — Assessment & Plan Note (Signed)
hgba1c acceptable, minimize simple carbs. Increase exercise as tolerated.  

## 2018-05-23 NOTE — Assessment & Plan Note (Signed)
On Levothyroxine, continue to monitor 

## 2018-05-23 NOTE — Patient Instructions (Signed)
Preventive Care 40-64 Years, Female Preventive care refers to lifestyle choices and visits with your health care provider that can promote health and wellness. What does preventive care include?  A yearly physical exam. This is also called an annual well check.  Dental exams once or twice a year.  Routine eye exams. Ask your health care provider how often you should have your eyes checked.  Personal lifestyle choices, including: ? Daily care of your teeth and gums. ? Regular physical activity. ? Eating a healthy diet. ? Avoiding tobacco and drug use. ? Limiting alcohol use. ? Practicing safe sex. ? Taking low-dose aspirin daily starting at age 58. ? Taking vitamin and mineral supplements as recommended by your health care provider. What happens during an annual well check? The services and screenings done by your health care provider during your annual well check will depend on your age, overall health, lifestyle risk factors, and family history of disease. Counseling Your health care provider may ask you questions about your:  Alcohol use.  Tobacco use.  Drug use.  Emotional well-being.  Home and relationship well-being.  Sexual activity.  Eating habits.  Work and work Statistician.  Method of birth control.  Menstrual cycle.  Pregnancy history.  Screening You may have the following tests or measurements:  Height, weight, and BMI.  Blood pressure.  Lipid and cholesterol levels. These may be checked every 5 years, or more frequently if you are over 81 years old.  Skin check.  Lung cancer screening. You may have this screening every year starting at age 78 if you have a 30-pack-year history of smoking and currently smoke or have quit within the past 15 years.  Fecal occult blood test (FOBT) of the stool. You may have this test every year starting at age 65.  Flexible sigmoidoscopy or colonoscopy. You may have a sigmoidoscopy every 5 years or a colonoscopy  every 10 years starting at age 30.  Hepatitis C blood test.  Hepatitis B blood test.  Sexually transmitted disease (STD) testing.  Diabetes screening. This is done by checking your blood sugar (glucose) after you have not eaten for a while (fasting). You may have this done every 1-3 years.  Mammogram. This may be done every 1-2 years. Talk to your health care provider about when you should start having regular mammograms. This may depend on whether you have a family history of breast cancer.  BRCA-related cancer screening. This may be done if you have a family history of breast, ovarian, tubal, or peritoneal cancers.  Pelvic exam and Pap test. This may be done every 3 years starting at age 80. Starting at age 36, this may be done every 5 years if you have a Pap test in combination with an HPV test.  Bone density scan. This is done to screen for osteoporosis. You may have this scan if you are at high risk for osteoporosis.  Discuss your test results, treatment options, and if necessary, the need for more tests with your health care provider. Vaccines Your health care provider may recommend certain vaccines, such as:  Influenza vaccine. This is recommended every year.  Tetanus, diphtheria, and acellular pertussis (Tdap, Td) vaccine. You may need a Td booster every 10 years.  Varicella vaccine. You may need this if you have not been vaccinated.  Zoster vaccine. You may need this after age 5.  Measles, mumps, and rubella (MMR) vaccine. You may need at least one dose of MMR if you were born in  1957 or later. You may also need a second dose.  Pneumococcal 13-valent conjugate (PCV13) vaccine. You may need this if you have certain conditions and were not previously vaccinated.  Pneumococcal polysaccharide (PPSV23) vaccine. You may need one or two doses if you smoke cigarettes or if you have certain conditions.  Meningococcal vaccine. You may need this if you have certain  conditions.  Hepatitis A vaccine. You may need this if you have certain conditions or if you travel or work in places where you may be exposed to hepatitis A.  Hepatitis B vaccine. You may need this if you have certain conditions or if you travel or work in places where you may be exposed to hepatitis B.  Haemophilus influenzae type b (Hib) vaccine. You may need this if you have certain conditions.  Talk to your health care provider about which screenings and vaccines you need and how often you need them. This information is not intended to replace advice given to you by your health care provider. Make sure you discuss any questions you have with your health care provider. Document Released: 12/27/2015 Document Revised: 08/19/2016 Document Reviewed: 10/01/2015 Elsevier Interactive Patient Education  2018 [employer]  

## 2018-05-23 NOTE — Progress Notes (Signed)
Subjective:  I acted as a Neurosurgeonscribe for Dr. Abner GreenspanBlyth. [place], [place]  Patient ID: Tiffany Mckenzie, female    DOB: [birth year], 62 y.o.   MRN: [id number]  Chief Complaint  Patient presents with  . Annual Exam    HPI  Patient is in today for an annual exam and follow up on chronic medical concerns including otitis externa, diabetes mellitus, hypothyroidism and more. She has been trying to stay active and maintain a heart healthy diet. Denies any polyruai or polydipsia. Ear pain bilaterally has been persistent but does wax and wane. No fevers and chills. Denies CP/palp/SOB/HA/congestion/fevers/GI or GU c/o. Taking meds as prescribed. Notes some trouble with muscle cramps at times.   Patient Care Team: Bradd CanaryBlyth, Stacey A, MD as PCP - General (Family Medicine) Blondell RevealFletcher, Richard V, MD (Obstetrics and Gynecology) Joana Reameravanzo, Marveen Reeksobert J, MD as Consulting Physician (Ophthalmology)   Past Medical History:  Diagnosis Date  . Allergic rhinitis   . Allergic state 03/03/2015  . Annual physical exam 02/09/2012  . Anxiety state 09/30/2016  . Arthritis   . Asthma   . Constipation 03/03/2015  . Diabetes mellitus type 2 in obese (HCC) 09/13/2012  . History of chicken pox   . History of hepatitis    age 89  . Hyperglycemia 09/13/2012  . Hypertension   . Hypothyroidism   . Left carotid bruit 03/11/2016  . Obesity 03/28/2017  . Overactive bladder 03/17/2013   Seeing urology  . Raynaud phenomenon 03/03/2015  . Type II or unspecified type diabetes mellitus without mention of complication, uncontrolled 09/13/2012    Past Surgical History:  Procedure Laterality Date  . ABDOMINAL HYSTERECTOMY  2002  . TONSILLECTOMY      Family History  Problem Relation Age of Onset  . Alcohol abuse Father   . Cancer Father        lung  . Diabetes Father   . Arthritis Mother   . Heart disease Mother        Blockage & double bypass  . Hypertension Mother   . Diabetes Mother   . Colon cancer Paternal Grandmother   . Stroke Paternal  Grandmother        maternal grandfather  . Breast cancer Maternal Grandmother   . Heart disease Maternal Grandfather   . Scoliosis Daughter   . Cancer Paternal Grandfather        lung    Social History   Socioeconomic History  . Marital status: Married    Spouse name: Not on file  . Number of children: Not on file  . Years of education: Not on file  . Highest education level: Not on file  Occupational History  . Not on file  Social Needs  . Financial resource strain: Not on file  . Food insecurity:    Worry: Not on file    Inability: Not on file  . Transportation needs:    Medical: Not on file    Non-medical: Not on file  Tobacco Use  . Smoking status: Never Smoker  . Smokeless tobacco: Never Used  Substance and Sexual Activity  . Alcohol use: Yes    Comment: less than once a month, white wine or mixed drink  . Drug use: No  . Sexual activity: Not on file  Lifestyle  . Physical activity:    Days per week: Not on file    Minutes per session: Not on file  . Stress: Not on file  Relationships  . Social connections:  Talks on phone: Not on file    Gets together: Not on file    Attends religious service: Not on file    Active member of club or organization: Not on file    Attends meetings of clubs or organizations: Not on file    Relationship status: Not on file  . Intimate partner violence:    Fear of current or ex partner: Not on file    Emotionally abused: Not on file    Physically abused: Not on file    Forced sexual activity: Not on file  Other Topics Concern  . Not on file  Social History Narrative  . Not on file    Outpatient Medications Prior to Visit  Medication Sig Dispense Refill  . acetic acid (VOSOL) 2 % otic solution Place 4 drops into the right ear 3 (three) times daily. 15 mL 1  . albuterol (VENTOLIN HFA) 108 (90 Base) MCG/ACT inhaler Inhale 2 puffs into the lungs every 6 (six) hours as needed for wheezing or shortness of breath. 1 Inhaler 1    . Blood Glucose Monitoring Suppl (FREESTYLE LITE) DEVI Reported on 01/15/2016    . Cyanocobalamin (VITAMIN B 12 PO) Take 1,000 mcg by mouth daily.    Marland Kitchen estradiol (ESTRACE) 1 MG tablet Take 1 mg by mouth daily.    Marland Kitchen ibuprofen (ADVIL,MOTRIN) 800 MG tablet Take 1 tablet (800 mg total) by mouth 3 (three) times daily. 21 tablet 0  . Lancets (FREESTYLE) lancets Reported on 01/15/2016    . levothyroxine (SYNTHROID, LEVOTHROID) 100 MCG tablet TAKE 1 TABLET DAILY BEFORE BREAKFAST 90 tablet 1  . loratadine (CLARITIN) 10 MG tablet Take 10 mg by mouth daily.    Marland Kitchen LORazepam (ATIVAN) 0.5 MG tablet TAKE 1 TABLET TWICE A DAY AS NEEDED 60 tablet 2  . metFORMIN (GLUCOPHAGE) 500 MG tablet TAKE 1 TABLET (500 MG TOTAL) BY MOUTH 2 (TWO) TIMES DAILY WITH A MEAL. 180 tablet 1  . neomycin-polymyxin-hydrocortisone (CORTISPORIN) 3.5-10000-1 otic suspension PLACE 3 DROPS INTO BOTH EARS 3 TIMES DAILY. 10 mL 0  . olmesartan (BENICAR) 20 MG tablet TAKE 1/2 TABLET DAILY 45 tablet 2  . triamcinolone cream (KENALOG) 0.1 % Apply 1 application topically 2 (two) times daily. 30 g 0  . glucose blood (FREESTYLE LITE) test strip CHECK BLOOD SUGAR ONCE DAILY AS NEEDED FOR SYMPTOMS 100 each 0  . glucose blood test strip Reported on 01/15/2016    . HYDROcodone-homatropine (HYCODAN) 5-1.5 MG/5ML syrup Take 5 mLs by mouth every 8 (eight) hours as needed for cough. 180 mL 0   No facility-administered medications prior to visit.     Allergies  Allergen Reactions  . Codeine Nausea And Vomiting    vomiting  . Clarithromycin Rash    Review of Systems  Constitutional: Negative for chills, fever and malaise/fatigue.  HENT: Positive for ear pain. Negative for congestion, ear discharge and hearing loss.   Eyes: Negative for discharge.  Respiratory: Negative for cough, sputum production and shortness of breath.   Cardiovascular: Negative for chest pain, palpitations and leg swelling.  Gastrointestinal: Negative for abdominal pain, blood in  stool, constipation, diarrhea, heartburn, nausea and vomiting.  Genitourinary: Negative for dysuria, frequency, hematuria and urgency.  Musculoskeletal: Positive for myalgias. Negative for back pain and falls.  Skin: Negative for rash.  Neurological: Negative for dizziness, sensory change, loss of consciousness, weakness and headaches.  Endo/Heme/Allergies: Negative for environmental allergies. Does not bruise/bleed easily.  Psychiatric/Behavioral: Negative for depression and suicidal ideas. The patient  is not nervous/anxious and does not have insomnia.        Objective:    Physical Exam  Constitutional: She is oriented to person, place, and time. No distress.  HENT:  Head: Normocephalic and atraumatic.  Right Ear: External ear normal.  Left Ear: External ear normal.  Nose: Nose normal.  Mouth/Throat: Oropharynx is clear and moist. No oropharyngeal exudate.  Eyes: Pupils are equal, round, and reactive to light. Conjunctivae are normal. Right eye exhibits no discharge. Left eye exhibits no discharge. No scleral icterus.  Neck: Normal range of motion. Neck supple. No thyromegaly present.  Cardiovascular: Normal rate, regular rhythm, normal heart sounds and intact distal pulses.  No murmur heard. Pulmonary/Chest: Effort normal and breath sounds normal. No respiratory distress. She has no wheezes. She has no rales.  Abdominal: Soft. Bowel sounds are normal. She exhibits no distension and no mass. There is no tenderness.  Musculoskeletal: Normal range of motion. She exhibits no edema or tenderness.  Lymphadenopathy:    She has no cervical adenopathy.  Neurological: She is alert and oriented to person, place, and time. She has normal reflexes. She displays normal reflexes. No cranial nerve deficit. Coordination normal.  Skin: Skin is warm and dry. No rash noted. She is not diaphoretic.    BP 121/71 (BP Location: Left Arm, Patient Position: Sitting, Cuff Size: Normal)   Pulse 84   Temp  98.6 F (37 C) (Oral)   Resp 18   Wt 177 lb 12.8 oz (80.6 kg)   SpO2 96%   BMI 32.52 kg/m  Wt Readings from Last 3 Encounters:  05/23/18 177 lb 12.8 oz (80.6 kg)  09/23/17 178 lb 6.4 oz (80.9 kg)  04/27/17 180 lb 3.2 oz (81.7 kg)   BP Readings from Last 3 Encounters:  05/23/18 121/71  09/23/17 100/60  04/27/17 (!) 112/59     Immunization History  Administered Date(s) Administered  . Influenza Split 09/14/2012  . Influenza,inj,Quad PF,6+ Mos 11/06/2013, 08/29/2015  . Pneumococcal Conjugate-13 11/06/2013  . Tdap 12/04/2014  . Zoster 03/10/2016    Health Maintenance  Topic Date Due  . OPHTHALMOLOGY EXAM  04/06/1964  . HIV Screening  04/06/1969  . FOOT EXAM  03/10/2017  . INFLUENZA VACCINE  07/14/2018  . HEMOGLOBIN A1C  11/22/2018  . MAMMOGRAM  05/18/2019  . COLONOSCOPY  02/25/2020  . TETANUS/TDAP  12/04/2024  . Hepatitis C Screening  Completed    Lab Results  Component Value Date   WBC 7.4 05/23/2018   HGB 13.8 05/23/2018   HCT 39.7 05/23/2018   PLT 243.0 05/23/2018   GLUCOSE 164 (H) 05/23/2018   CHOL 169 05/23/2018   TRIG 104.0 05/23/2018   HDL 46.40 05/23/2018   LDLCALC 102 (H) 05/23/2018   ALT 19 05/23/2018   AST 14 05/23/2018   NA 139 05/23/2018   K 4.3 05/23/2018   CL 101 05/23/2018   CREATININE 0.70 05/23/2018   BUN 13 05/23/2018   CO2 30 05/23/2018   TSH 0.80 09/23/2017   HGBA1C 7.9 (H) 05/23/2018   MICROALBUR <0.7 09/07/2016    Lab Results  Component Value Date   TSH 0.80 09/23/2017   Lab Results  Component Value Date   WBC 7.4 05/23/2018   HGB 13.8 05/23/2018   HCT 39.7 05/23/2018   MCV 88.1 05/23/2018   PLT 243.0 05/23/2018   Lab Results  Component Value Date   NA 139 05/23/2018   K 4.3 05/23/2018   CO2 30 05/23/2018   GLUCOSE 164 (H)  05/23/2018   BUN 13 05/23/2018   CREATININE 0.70 05/23/2018   BILITOT 0.6 05/23/2018   ALKPHOS 116 05/23/2018   AST 14 05/23/2018   ALT 19 05/23/2018   PROT 6.5 05/23/2018   ALBUMIN 4.2  05/23/2018   CALCIUM 9.6 05/23/2018   GFR 89.50 05/23/2018   Lab Results  Component Value Date   CHOL 169 05/23/2018   Lab Results  Component Value Date   HDL 46.40 05/23/2018   Lab Results  Component Value Date   LDLCALC 102 (H) 05/23/2018   Lab Results  Component Value Date   TRIG 104.0 05/23/2018   Lab Results  Component Value Date   CHOLHDL 4 05/23/2018   Lab Results  Component Value Date   HGBA1C 7.9 (H) 05/23/2018         Assessment & Plan:   Problem List Items Addressed This Visit    Diabetes mellitus type 2 in obese (HCC) (Chronic)    hgba1c acceptable, minimize simple carbs. Increase exercise as tolerated.       Relevant Orders   Hemoglobin A1c (Completed)   Comprehensive metabolic panel (Completed)   Annual physical exam    Patient encouraged to maintain heart healthy diet, regular exercise, adequate sleep. Consider daily probiotics. Take medications as prescribed.      Chronic back pain    Encouraged moist heat and gentle stretching as tolerated. May try NSAIDs and prescription meds as directed and report if symptoms worsen or seek immediate care. Has to rest after just 15 minutes on her feet she has to rest for 10 or 15 minutes.       Relevant Orders   CBC (Completed)   Hypothyroidism    On Levothyroxine, continue to monitor      B12 deficiency    Oral supplements      Relevant Orders   Vitamin B12 (Completed)   Obesity    Encouraged DASH diet, decrease po intake and increase exercise as tolerated. Needs 7-8 hours of sleep nightly. Avoid trans fats, eat small, frequent meals every 4-5 hours with lean proteins, complex carbs and healthy fats. Minimize simple carbs      Otitis externa    Persistent despite several drops. Given a course of Augmentin and if no improvement will need referral to ENT for further evaluation. Take a daily probiotic      Muscle cramp    Hydrate well and labs reviewed. Try Hyland's leg cramp med      Relevant  Orders   Magnesium (Completed)    Other Visit Diagnoses    Hyperlipidemia, unspecified hyperlipidemia type    -  Primary   Relevant Orders   Lipid panel (Completed)      I have discontinued Zoraida A. Metter's HYDROcodone-homatropine, glucose blood, and amoxicillin-clavulanate. I have also changed her glucose blood. Additionally, I am having her maintain her estradiol, Cyanocobalamin (VITAMIN B 12 PO), FREESTYLE LITE, freestyle, ibuprofen, albuterol, loratadine, triamcinolone cream, neomycin-polymyxin-hydrocortisone, acetic acid, LORazepam, olmesartan, metFORMIN, and levothyroxine.  Meds ordered this encounter  Medications  . glucose blood test strip    Sig: Use as directed. dz E11.1 lifescan, onetouch    Dispense:  100 each    Refill:  3    Patient prefers lifescan oneotuch  . DISCONTD: amoxicillin-clavulanate (AUGMENTIN) 875-125 MG tablet    Sig: Take 1 tablet by mouth 2 (two) times daily.    Dispense:  14 tablet    Refill:  0    CMA served as scribe during this visit.  History, Physical and Plan performed by medical provider. Documentation and orders reviewed and attested to.  Penni Homans, MD

## 2018-05-23 NOTE — Assessment & Plan Note (Signed)
Encouraged moist heat and gentle stretching as tolerated. May try NSAIDs and prescription meds as directed and report if symptoms worsen or seek immediate care. Has to rest after just 15 minutes on her feet she has to rest for 10 or 15 minutes.

## 2018-05-25 DIAGNOSIS — R252 Cramp and spasm: Secondary | ICD-10-CM | POA: Insufficient documentation

## 2018-05-25 DIAGNOSIS — H6090 Unspecified otitis externa, unspecified ear: Secondary | ICD-10-CM | POA: Insufficient documentation

## 2018-05-25 NOTE — Assessment & Plan Note (Signed)
Patient encouraged to maintain heart healthy diet, regular exercise, adequate sleep. Consider daily probiotics. Take medications as prescribed 

## 2018-05-25 NOTE — Assessment & Plan Note (Signed)
Persistent despite several drops. Given a course of Augmentin and if no improvement will need referral to ENT for further evaluation. Take a daily probiotic

## 2018-05-25 NOTE — Assessment & Plan Note (Signed)
Hydrate well and labs reviewed. Try Hyland's leg cramp med

## 2018-05-31 DIAGNOSIS — Z1231 Encounter for screening mammogram for malignant neoplasm of breast: Secondary | ICD-10-CM | POA: Diagnosis not present

## 2018-05-31 LAB — HM MAMMOGRAPHY

## 2018-06-10 ENCOUNTER — Encounter: Payer: Self-pay | Admitting: Family Medicine

## 2018-07-12 ENCOUNTER — Encounter: Payer: Self-pay | Admitting: Family Medicine

## 2018-07-12 ENCOUNTER — Ambulatory Visit (INDEPENDENT_AMBULATORY_CARE_PROVIDER_SITE_OTHER): Payer: BLUE CROSS/BLUE SHIELD | Admitting: Family Medicine

## 2018-07-12 VITALS — BP 118/68 | HR 72 | Temp 98.2°F | Resp 18 | Ht 62.0 in | Wt 181.2 lb

## 2018-07-12 DIAGNOSIS — R300 Dysuria: Secondary | ICD-10-CM | POA: Diagnosis not present

## 2018-07-12 DIAGNOSIS — R350 Frequency of micturition: Secondary | ICD-10-CM | POA: Diagnosis not present

## 2018-07-12 DIAGNOSIS — N390 Urinary tract infection, site not specified: Secondary | ICD-10-CM | POA: Diagnosis not present

## 2018-07-12 LAB — POC URINALSYSI DIPSTICK (AUTOMATED)
BILIRUBIN UA: NEGATIVE
Blood, UA: POSITIVE
Glucose, UA: POSITIVE — AB
KETONES UA: NEGATIVE
NITRITE UA: NEGATIVE
Protein, UA: POSITIVE — AB
Spec Grav, UA: 1.025 (ref 1.010–1.025)
Urobilinogen, UA: 0.2 E.U./dL
pH, UA: 6 (ref 5.0–8.0)

## 2018-07-12 MED ORDER — CIPROFLOXACIN HCL 250 MG PO TABS: 250.0000 mg | ORAL_TABLET | Freq: Two times a day (BID) | ORAL | 0 refills | Status: DC

## 2018-07-12 NOTE — Progress Notes (Signed)
Subjective:  I acted as a Neurosurgeon for Dr. Talmage Coin, RMA  Patient ID: Tiffany Mckenzie, female    DOB: [birth year], 62 y.o.   MRN: [id number]  Chief Complaint  Patient presents with  . Urinary Tract Infection    HPI  Patient is in today for an acute visit for a possible UTI  + dysuria,  Some hematuria. No abd pain , no flank pain, no fevers   Last uti 10yr ago  Patient Care Team: [name], MD as PCP - General (Family Medicine) Blondell Reveal, MD (Obstetrics and Gynecology) Joana Reamer Marveen Reeks, MD as Consulting Physician (Ophthalmology)   Past Medical History:  Diagnosis Date  . Allergic rhinitis   . Allergic state 03/03/2015  . Annual physical exam 02/09/2012  . Anxiety state 09/30/2016  . Arthritis   . Asthma   . Constipation 03/03/2015  . Diabetes mellitus type 2 in obese (HCC) 09/13/2012  . History of chicken pox   . History of hepatitis    age 6  . Hyperglycemia 09/13/2012  . Hypertension   . Hypothyroidism   . Left carotid bruit 03/11/2016  . Obesity 03/28/2017  . Overactive bladder 03/17/2013   Seeing urology  . Raynaud phenomenon 03/03/2015  . Type II or unspecified type diabetes mellitus without mention of complication, uncontrolled 09/13/2012    Past Surgical History:  Procedure Laterality Date  . ABDOMINAL HYSTERECTOMY  2002  . TONSILLECTOMY      Family History  Problem Relation Age of Onset  . Alcohol abuse Father   . Cancer Father        lung  . Diabetes Father   . Arthritis Mother   . Heart disease Mother        Blockage & double bypass  . Hypertension Mother   . Diabetes Mother   . Colon cancer Paternal Grandmother   . Stroke Paternal Grandmother        maternal grandfather  . Breast cancer Maternal Grandmother   . Heart disease Maternal Grandfather   . Scoliosis Daughter   . Cancer Paternal Grandfather        lung    Social History   Socioeconomic History  . Marital status: Married    Spouse name: Not on file  . Number of  children: Not on file  . Years of education: Not on file  . Highest education level: Not on file  Occupational History  . Not on file  Social Needs  . Financial resource strain: Not on file  . Food insecurity:    Worry: Not on file    Inability: Not on file  . Transportation needs:    Medical: Not on file    Non-medical: Not on file  Tobacco Use  . Smoking status: Never Smoker  . Smokeless tobacco: Never Used  Substance and Sexual Activity  . Alcohol use: Yes    Comment: less than once a month, white wine or mixed drink  . Drug use: No  . Sexual activity: Not on file  Lifestyle  . Physical activity:    Days per week: Not on file    Minutes per session: Not on file  . Stress: Not on file  Relationships  . Social connections:    Talks on phone: Not on file    Gets together: Not on file    Attends religious service: Not on file    Active member of club or organization: Not on file    Attends meetings  of clubs or organizations: Not on file    Relationship status: Not on file  . Intimate partner violence:    Fear of current or ex partner: Not on file    Emotionally abused: Not on file    Physically abused: Not on file    Forced sexual activity: Not on file  Other Topics Concern  . Not on file  Social History Narrative  . Not on file    Outpatient Medications Prior to Visit  Medication Sig Dispense Refill  . acetic acid (VOSOL) 2 % otic solution Place 4 drops into the right ear 3 (three) times daily. 15 mL 1  . albuterol (VENTOLIN HFA) 108 (90 Base) MCG/ACT inhaler Inhale 2 puffs into the lungs every 6 (six) hours as needed for wheezing or shortness of breath. 1 Inhaler 1  . Blood Glucose Monitoring Suppl (FREESTYLE LITE) DEVI Reported on 01/15/2016    . Cyanocobalamin (VITAMIN B 12 PO) Take 1,000 mcg by mouth daily.    Marland Kitchen estradiol (ESTRACE) 1 MG tablet Take 1 mg by mouth daily.    Marland Kitchen glucose blood test strip Use as directed. dz E11.1 lifescan, onetouch 100 each 3  .  ibuprofen (ADVIL,MOTRIN) 800 MG tablet Take 1 tablet (800 mg total) by mouth 3 (three) times daily. 21 tablet 0  . Lancets (FREESTYLE) lancets Reported on 01/15/2016    . levothyroxine (SYNTHROID, LEVOTHROID) 100 MCG tablet TAKE 1 TABLET DAILY BEFORE BREAKFAST 90 tablet 1  . loratadine (CLARITIN) 10 MG tablet Take 10 mg by mouth daily.    Marland Kitchen LORazepam (ATIVAN) 0.5 MG tablet TAKE 1 TABLET TWICE A DAY AS NEEDED 60 tablet 2  . metFORMIN (GLUCOPHAGE) 500 MG tablet TAKE 1 TABLET (500 MG TOTAL) BY MOUTH 2 (TWO) TIMES DAILY WITH A MEAL. 180 tablet 1  . neomycin-polymyxin-hydrocortisone (CORTISPORIN) 3.5-10000-1 otic suspension PLACE 3 DROPS INTO BOTH EARS 3 TIMES DAILY. 10 mL 0  . olmesartan (BENICAR) 20 MG tablet TAKE 1/2 TABLET DAILY 45 tablet 2  . triamcinolone cream (KENALOG) 0.1 % Apply 1 application topically 2 (two) times daily. 30 g 0   No facility-administered medications prior to visit.     Allergies  Allergen Reactions  . Codeine Nausea And Vomiting    vomiting  . Clarithromycin Rash    Review of Systems  Constitutional: Negative for chills, fever and malaise/fatigue.  HENT: Negative for congestion and hearing loss.   Eyes: Negative for discharge.  Respiratory: Negative for cough, sputum production and shortness of breath.   Cardiovascular: Negative for chest pain, palpitations and leg swelling.  Gastrointestinal: Negative for abdominal pain, blood in stool, constipation, diarrhea, heartburn, nausea and vomiting.  Genitourinary: Positive for dysuria, frequency and hematuria. Negative for flank pain and urgency.  Musculoskeletal: Negative for back pain, falls and myalgias.  Skin: Negative for rash.  Neurological: Negative for dizziness, sensory change, loss of consciousness, weakness and headaches.  Endo/Heme/Allergies: Negative for environmental allergies. Does not bruise/bleed easily.  Psychiatric/Behavioral: Negative for depression and suicidal ideas. The patient is not  nervous/anxious and does not have insomnia.        Objective:    Physical Exam  Constitutional: She is oriented to person, place, and time. She appears well-developed and well-nourished.  HENT:  Head: Normocephalic and atraumatic.  Eyes: Conjunctivae and EOM are normal.  Neck: Normal range of motion. Neck supple. No JVD present. Carotid bruit is not present. No thyromegaly present.  Cardiovascular: Normal rate, regular rhythm and normal heart sounds.  No murmur  heard. Pulmonary/Chest: Effort normal and breath sounds normal. No respiratory distress. She has no wheezes. She has no rales. She exhibits no tenderness.  Abdominal: Soft. She exhibits no distension and no mass. There is no tenderness. There is no rebound and no guarding. No hernia.  Musculoskeletal: She exhibits no edema.  Neurological: She is alert and oriented to person, place, and time.  Psychiatric: She has a normal mood and affect.  Nursing note and vitals reviewed.   BP 118/68 (BP Location: Left Arm, Patient Position: Sitting, Cuff Size: Normal)   Pulse 72   Temp 98.2 F (36.8 C) (Oral)   Resp 18   Ht 5\' 2"  (1.575 m)   Wt 181 lb 3.2 oz (82.2 kg)   SpO2 98%   BMI 33.14 kg/m  Wt Readings from Last 3 Encounters:  07/12/18 181 lb 3.2 oz (82.2 kg)  05/23/18 177 lb 12.8 oz (80.6 kg)  09/23/17 178 lb 6.4 oz (80.9 kg)   BP Readings from Last 3 Encounters:  07/12/18 118/68  05/23/18 121/71  09/23/17 100/60     Immunization History  Administered Date(s) Administered  . Influenza Split 09/14/2012  . Influenza,inj,Quad PF,6+ Mos 11/06/2013, 08/29/2015  . Pneumococcal Conjugate-13 11/06/2013  . Tdap 12/04/2014  . Zoster 03/10/2016    Health Maintenance  Topic Date Due  . OPHTHALMOLOGY EXAM  04/06/1964  . HIV Screening  04/06/1969  . FOOT EXAM  03/10/2017  . INFLUENZA VACCINE  07/14/2018  . HEMOGLOBIN A1C  11/22/2018  . COLONOSCOPY  02/25/2020  . MAMMOGRAM  05/31/2020  . TETANUS/TDAP  12/04/2024  .  Hepatitis C Screening  Completed    Lab Results  Component Value Date   WBC 7.4 05/23/2018   HGB 13.8 05/23/2018   HCT 39.7 05/23/2018   PLT 243.0 05/23/2018   GLUCOSE 164 (H) 05/23/2018   CHOL 169 05/23/2018   TRIG 104.0 05/23/2018   HDL 46.40 05/23/2018   LDLCALC 102 (H) 05/23/2018   ALT 19 05/23/2018   AST 14 05/23/2018   NA 139 05/23/2018   K 4.3 05/23/2018   CL 101 05/23/2018   CREATININE 0.70 05/23/2018   BUN 13 05/23/2018   CO2 30 05/23/2018   TSH 0.80 09/23/2017   HGBA1C 7.9 (H) 05/23/2018   MICROALBUR <0.7 09/07/2016    Lab Results  Component Value Date   TSH 0.80 09/23/2017   Lab Results  Component Value Date   WBC 7.4 05/23/2018   HGB 13.8 05/23/2018   HCT 39.7 05/23/2018   MCV 88.1 05/23/2018   PLT 243.0 05/23/2018   Lab Results  Component Value Date   NA 139 05/23/2018   K 4.3 05/23/2018   CO2 30 05/23/2018   GLUCOSE 164 (H) 05/23/2018   BUN 13 05/23/2018   CREATININE 0.70 05/23/2018   BILITOT 0.6 05/23/2018   ALKPHOS 116 05/23/2018   AST 14 05/23/2018   ALT 19 05/23/2018   PROT 6.5 05/23/2018   ALBUMIN 4.2 05/23/2018   CALCIUM 9.6 05/23/2018   GFR 89.50 05/23/2018   Lab Results  Component Value Date   CHOL 169 05/23/2018   Lab Results  Component Value Date   HDL 46.40 05/23/2018   Lab Results  Component Value Date   LDLCALC 102 (H) 05/23/2018   Lab Results  Component Value Date   TRIG 104.0 05/23/2018   Lab Results  Component Value Date   CHOLHDL 4 05/23/2018   Lab Results  Component Value Date   HGBA1C 7.9 (H) 05/23/2018  Assessment & Plan:   Problem List Items Addressed This Visit    None    Visit Diagnoses    Frequency of urination    -  Primary   Relevant Orders   POCT Urinalysis Dipstick (Automated) (Completed)   Urine Culture   Dysuria       Relevant Orders   POCT Urinalysis Dipstick (Automated) (Completed)   Urine Culture   Urinary tract infection without hematuria, site unspecified        Relevant Medications   ciprofloxacin (CIPRO) 250 MG tablet    culture pending   I am having Donta A. Scafidi start on ciprofloxacin. I am also having her maintain her estradiol, Cyanocobalamin (VITAMIN B 12 PO), FREESTYLE LITE, freestyle, ibuprofen, albuterol, loratadine, triamcinolone cream, neomycin-polymyxin-hydrocortisone, acetic acid, LORazepam, olmesartan, metFORMIN, levothyroxine, and glucose blood.  Meds ordered this encounter  Medications  . ciprofloxacin (CIPRO) 250 MG tablet    Sig: Take 1 tablet (250 mg total) by mouth 2 (two) times daily.    Dispense:  10 tablet    Refill:  0    CMA served as scribe during this visit. History, Physical and Plan performed by medical provider. Documentation and orders reviewed and attested to.  Donato SchultzYvonne R Lowne Chase, DO

## 2018-07-12 NOTE — Patient Instructions (Signed)

## 2018-07-14 LAB — URINE CULTURE
MICRO NUMBER:: 90899154
SPECIMEN QUALITY: ADEQUATE

## 2018-07-23 ENCOUNTER — Other Ambulatory Visit: Payer: Self-pay | Admitting: Family Medicine

## 2018-09-25 ENCOUNTER — Other Ambulatory Visit: Payer: Self-pay | Admitting: Family Medicine

## 2018-10-26 ENCOUNTER — Other Ambulatory Visit: Payer: Self-pay | Admitting: Family Medicine

## 2018-11-24 ENCOUNTER — Encounter: Payer: Self-pay | Admitting: Family Medicine

## 2018-11-24 ENCOUNTER — Ambulatory Visit (INDEPENDENT_AMBULATORY_CARE_PROVIDER_SITE_OTHER): Payer: BLUE CROSS/BLUE SHIELD | Admitting: Family Medicine

## 2018-11-24 VITALS — BP 102/70 | HR 86 | Temp 98.3°F | Resp 18 | Wt 179.2 lb

## 2018-11-24 DIAGNOSIS — E1169 Type 2 diabetes mellitus with other specified complication: Secondary | ICD-10-CM | POA: Diagnosis not present

## 2018-11-24 DIAGNOSIS — F411 Generalized anxiety disorder: Secondary | ICD-10-CM

## 2018-11-24 DIAGNOSIS — E039 Hypothyroidism, unspecified: Secondary | ICD-10-CM | POA: Diagnosis not present

## 2018-11-24 DIAGNOSIS — Z79899 Other long term (current) drug therapy: Secondary | ICD-10-CM | POA: Diagnosis not present

## 2018-11-24 DIAGNOSIS — E538 Deficiency of other specified B group vitamins: Secondary | ICD-10-CM | POA: Diagnosis not present

## 2018-11-24 DIAGNOSIS — E669 Obesity, unspecified: Secondary | ICD-10-CM | POA: Diagnosis not present

## 2018-11-24 DIAGNOSIS — E785 Hyperlipidemia, unspecified: Secondary | ICD-10-CM | POA: Diagnosis not present

## 2018-11-24 DIAGNOSIS — J40 Bronchitis, not specified as acute or chronic: Secondary | ICD-10-CM

## 2018-11-24 LAB — COMPREHENSIVE METABOLIC PANEL
ALT: 17 U/L (ref 0–35)
AST: 12 U/L (ref 0–37)
Albumin: 4.2 g/dL (ref 3.5–5.2)
Alkaline Phosphatase: 111 U/L (ref 39–117)
BUN: 14 mg/dL (ref 6–23)
CO2: 31 mEq/L (ref 19–32)
Calcium: 9.4 mg/dL (ref 8.4–10.5)
Chloride: 103 mEq/L (ref 96–112)
Creatinine, Ser: 0.75 mg/dL (ref 0.40–1.20)
GFR: 82.52 mL/min (ref >60.00)
Glucose, Bld: 191 mg/dL — ABNORMAL HIGH (ref 70–99)
Potassium: 4.7 mEq/L (ref 3.5–5.1)
Sodium: 139 mEq/L (ref 135–145)
Total Bilirubin: 0.6 mg/dL (ref 0.2–1.2)
Total Protein: 6.4 g/dL (ref 6.0–8.3)

## 2018-11-24 LAB — CBC
HCT: 40.3 % (ref 36.0–46.0)
Hemoglobin: 14 g/dL (ref 12.0–15.0)
MCHC: 34.7 g/dL (ref 30.0–36.0)
MCV: 89.1 fl (ref 78.0–100.0)
Platelets: 237 10*3/uL (ref 150.0–400.0)
RBC: 4.53 Mil/uL (ref 3.87–5.11)
RDW: 12.9 % (ref 11.5–15.5)
WBC: 6.5 10*3/uL (ref 4.0–10.5)

## 2018-11-24 LAB — LIPID PANEL
Cholesterol: 152 mg/dL (ref 0–200)
HDL: 43.6 mg/dL (ref >39.00)
LDL Cholesterol: 94 mg/dL (ref 0–99)
NonHDL: 108.51
Total CHOL/HDL Ratio: 3
Triglycerides: 75 mg/dL (ref 0.0–149.0)
VLDL: 15 mg/dL (ref 0.0–40.0)

## 2018-11-24 LAB — VITAMIN B12: Vitamin B-12: 183 pg/mL — ABNORMAL LOW (ref 211–911)

## 2018-11-24 LAB — HEMOGLOBIN A1C: Hgb A1c MFr Bld: 8.1 % — ABNORMAL HIGH (ref 4.6–6.5)

## 2018-11-24 LAB — TSH: TSH: 1.46 u[IU]/mL (ref 0.35–4.50)

## 2018-11-24 MED ORDER — LORAZEPAM 0.5 MG PO TABS: 0.5000 mg | ORAL_TABLET | Freq: Two times a day (BID) | ORAL | 2 refills | Status: DC | PRN

## 2018-11-24 MED ORDER — HYDROCODONE-HOMATROPINE 5-1.5 MG/5ML PO SYRP: 5.0000 mL | ORAL_SOLUTION | Freq: Three times a day (TID) | ORAL | 0 refills | Status: DC | PRN

## 2018-11-24 MED ORDER — TRIAMCINOLONE ACETONIDE 0.1 % EX CREA
1.0000 | TOPICAL_CREAM | Freq: Two times a day (BID) | CUTANEOUS | 1 refills | Status: DC
Start: 2018-11-24 — End: 2019-11-20

## 2018-11-24 MED ORDER — ACETIC ACID 2 % OT SOLN: 4.0000 [drp] | Freq: Three times a day (TID) | OTIC | 1 refills | Status: DC

## 2018-11-24 NOTE — Assessment & Plan Note (Signed)
>>  ASSESSMENT AND PLAN FOR TYPE 2 DIABETES MELLITUS WITH OBESITY WRITTEN ON 11/24/2018  9:14 AM BY BLYTH, STACEY A, MD  hgba1c acceptable, minimize simple carbs. Increase exercise as tolerated.

## 2018-11-24 NOTE — Patient Instructions (Addendum)
Encouraged increased rest and hydration, add probiotics, zinc such as Coldeze or Xicam. Treat fevers as needed. Mucinex twice, Vitamin C 500 to 1000 mg and elderberry    Shingrix is the new shingles shot, 2  Shots over 2-6 months check insurance regarding payment and record call for nurse appointment Chiropractor Dr Jewel Baizearcy Ward  Pneumovax is the pneumonia shot Acute Bronchitis, Adult Acute bronchitis is sudden (acute) swelling of the air tubes (bronchi) in the lungs. Acute bronchitis causes these tubes to fill with mucus, which can make it hard to breathe. It can also cause coughing or wheezing. In adults, acute bronchitis usually goes away within 2 weeks. A cough caused by bronchitis may last up to 3 weeks. Smoking, allergies, and asthma can make the condition worse. Repeated episodes of bronchitis may cause further lung problems, such as chronic obstructive pulmonary disease (COPD). What are the causes? This condition can be caused by germs and by substances that irritate the lungs, including:  Cold and flu viruses. This condition is most often caused by the same virus that causes a cold.  Bacteria.  Exposure to tobacco smoke, dust, fumes, and air pollution.  What increases the risk? This condition is more likely to develop in people who:  Have close contact with someone with acute bronchitis.  Are exposed to lung irritants, such as tobacco smoke, dust, fumes, and vapors.  Have a weak immune system.  Have a respiratory condition such as asthma.  What are the signs or symptoms? Symptoms of this condition include:  A cough.  Coughing up clear, yellow, or green mucus.  Wheezing.  Chest congestion.  Shortness of breath.  A fever.  Body aches.  Chills.  A sore throat.  How is this diagnosed? This condition is usually diagnosed with a physical exam. During the exam, your health care provider may order tests, such as chest X-rays, to rule out other conditions. He or she  may also:  Test a sample of your mucus for bacterial infection.  Check the level of oxygen in your blood. This is done to check for pneumonia.  Do a chest X-ray or lung function testing to rule out pneumonia and other conditions.  Perform blood tests.  Your health care provider will also ask about your symptoms and medical history. How is this treated? Most cases of acute bronchitis clear up over time without treatment. Your health care provider may recommend:  Drinking more fluids. Drinking more makes your mucus thinner, which may make it easier to breathe.  Taking a medicine for a fever or cough.  Taking an antibiotic medicine.  Using an inhaler to help improve shortness of breath and to control a cough.  Using a cool mist vaporizer or humidifier to make it easier to breathe.  Follow these instructions at home: Medicines  Take over-the-counter and prescription medicines only as told by your health care provider.  If you were prescribed an antibiotic, take it as told by your health care provider. Do not stop taking the antibiotic even if you start to feel better. General instructions  Get plenty of rest.  Drink enough fluids to keep your urine clear or pale yellow.  Avoid smoking and secondhand smoke. Exposure to cigarette smoke or irritating chemicals will make bronchitis worse. If you smoke and you need help quitting, ask your health care provider. Quitting smoking will help your lungs heal faster.  Use an inhaler, cool mist vaporizer, or humidifier as told by your health care provider.  Keep all  follow-up visits as told by your health care provider. This is important. How is this prevented? To lower your risk of getting this condition again:  Wash your hands often with soap and water. If soap and water are not available, use hand sanitizer.  Avoid contact with people who have cold symptoms.  Try not to touch your hands to your mouth, nose, or eyes.  Make sure to  get the flu shot every year.  Contact a health care provider if:  Your symptoms do not improve in 2 weeks of treatment. Get help right away if:  You cough up blood.  You have chest pain.  You have severe shortness of breath.  You become dehydrated.  You faint or keep feeling like you are going to faint.  You keep vomiting.  You have a severe headache.  Your fever or chills gets worse. This information is not intended to replace advice given to you by your health care provider. Make sure you discuss any questions you have with your health care provider. Document Released: 01/07/2005 Document Revised: 06/24/2016 Document Reviewed: 05/20/2016 Elsevier Interactive Patient Education  [employer].

## 2018-11-24 NOTE — Assessment & Plan Note (Signed)
Encouraged heart healthy diet, increase exercise, avoid trans fats, consider a krill oil cap daily 

## 2018-11-24 NOTE — Assessment & Plan Note (Signed)
hgba1c acceptable, minimize simple carbs. Increase exercise as tolerated.  

## 2018-11-24 NOTE — Assessment & Plan Note (Signed)
On Levothyroxine, continue to monitor 

## 2018-11-24 NOTE — Assessment & Plan Note (Signed)
Monitor and supplement 

## 2018-11-25 LAB — PAIN MGMT, PROFILE 8 W/CONF, U
6 Acetylmorphine: NEGATIVE ng/mL (ref <10)
Alcohol Metabolites: NEGATIVE ng/mL (ref <500)
Amphetamines: NEGATIVE ng/mL (ref <500)
Benzodiazepines: NEGATIVE ng/mL (ref <100)
Buprenorphine, Urine: NEGATIVE ng/mL (ref <5)
Cocaine Metabolite: NEGATIVE ng/mL (ref <150)
Creatinine: 80.7 mg/dL
MDMA: NEGATIVE ng/mL (ref <500)
Marijuana Metabolite: NEGATIVE ng/mL (ref <20)
Opiates: NEGATIVE ng/mL (ref <100)
Oxidant: NEGATIVE ug/mL (ref <200)
Oxycodone: NEGATIVE ng/mL (ref <100)
PH: 6.76 (ref 4.5–9.0)

## 2018-11-27 DIAGNOSIS — J40 Bronchitis, not specified as acute or chronic: Secondary | ICD-10-CM | POA: Insufficient documentation

## 2018-11-27 MED ORDER — HYDROCODONE-HOMATROPINE 5-1.5 MG/5ML PO SYRP: 5.0000 mL | ORAL_SOLUTION | Freq: Three times a day (TID) | ORAL | 0 refills | Status: DC | PRN

## 2018-11-27 MED ORDER — LORAZEPAM 0.5 MG PO TABS: 0.5000 mg | ORAL_TABLET | Freq: Two times a day (BID) | ORAL | 2 refills | Status: DC | PRN

## 2018-11-27 NOTE — Assessment & Plan Note (Signed)
Encouraged increased rest and hydration, add probiotics, zinc such as Coldeze or Xicam. Treat fevers as needed. Hydromet prn for cough is permitted

## 2018-11-27 NOTE — Assessment & Plan Note (Signed)
Doing well on current meds, no changes. Tolerates Lorazepam prn with good results. UDS and contract UTD

## 2018-11-27 NOTE — Progress Notes (Signed)
Subjective:    Patient ID: Tiffany Mckenzie, female    DOB: 02/05/1956, 62 y.o.   MRN: [id number]  No chief complaint on file.   HPI Patient is in today for follow-up.  She is noting difficulty with a cough and congestion for the past week or so has noted symptoms as far back as 4 weeks ago.  Has a cough which she has been using Delsym it is generally nonproductive although occasionally there is some gray phlegm.  No fevers or chills but she is noting some malaise and myalgias.  Fingerstick glucose have increased some with acute illness to 185 this am. Denies CP/palp/HA/fevers/GI or GU c/o. Taking meds as prescribed  Past Medical History:  Diagnosis Date  . Allergic rhinitis   . Allergic state 03/03/2015  . Annual physical exam 02/09/2012  . Anxiety state 09/30/2016  . Arthritis   . Asthma   . Constipation 03/03/2015  . Diabetes mellitus type 2 in obese (HCC) 09/13/2012  . History of chicken pox   . History of hepatitis    age 89  . Hyperglycemia 09/13/2012  . Hypertension   . Hypothyroidism   . Left carotid bruit 03/11/2016  . Obesity 03/28/2017  . Overactive bladder 03/17/2013   Seeing urology  . Raynaud phenomenon 03/03/2015  . Type II or unspecified type diabetes mellitus without mention of complication, uncontrolled 09/13/2012    Past Surgical History:  Procedure Laterality Date  . ABDOMINAL HYSTERECTOMY  2002  . TONSILLECTOMY      Family History  Problem Relation Age of Onset  . Alcohol abuse Father   . Cancer Father        lung  . Diabetes Father   . Arthritis Mother   . Heart disease Mother        Blockage & double bypass  . Hypertension Mother   . Diabetes Mother   . Colon cancer Paternal Grandmother   . Stroke Paternal Grandmother        maternal grandfather  . Breast cancer Maternal Grandmother   . Heart disease Maternal Grandfather   . Scoliosis Daughter   . Cancer Paternal Grandfather        lung    Social History   Socioeconomic History  . Marital  status: Married    Spouse name: Not on file  . Number of children: Not on file  . Years of education: Not on file  . Highest education level: Not on file  Occupational History  . Not on file  Social Needs  . Financial resource strain: Not on file  . Food insecurity:    Worry: Not on file    Inability: Not on file  . Transportation needs:    Medical: Not on file    Non-medical: Not on file  Tobacco Use  . Smoking status: Never Smoker  . Smokeless tobacco: Never Used  Substance and Sexual Activity  . Alcohol use: Yes    Comment: less than once a month, white wine or mixed drink  . Drug use: No  . Sexual activity: Not on file  Lifestyle  . Physical activity:    Days per week: Not on file    Minutes per session: Not on file  . Stress: Not on file  Relationships  . Social connections:    Talks on phone: Not on file    Gets together: Not on file    Attends religious service: Not on file    Active member of club or organization:  Not on file    Attends meetings of clubs or organizations: Not on file    Relationship status: Not on file  . Intimate partner violence:    Fear of current or ex partner: Not on file    Emotionally abused: Not on file    Physically abused: Not on file    Forced sexual activity: Not on file  Other Topics Concern  . Not on file  Social History Narrative  . Not on file    Outpatient Medications Prior to Visit  Medication Sig Dispense Refill  . albuterol (VENTOLIN HFA) 108 (90 Base) MCG/ACT inhaler Inhale 2 puffs into the lungs every 6 (six) hours as needed for wheezing or shortness of breath. 1 Inhaler 1  . Blood Glucose Monitoring Suppl (FREESTYLE LITE) DEVI Reported on 01/15/2016    . Cyanocobalamin (VITAMIN B 12 PO) Take 1,000 mcg by mouth daily.    Marland Kitchen ibuprofen (ADVIL,MOTRIN) 800 MG tablet Take 1 tablet (800 mg total) by mouth 3 (three) times daily. 21 tablet 0  . levothyroxine (SYNTHROID, LEVOTHROID) 100 MCG tablet TAKE 1 TABLET DAILY BEFORE  BREAKFAST 90 tablet 1  . loratadine (CLARITIN) 10 MG tablet Take 10 mg by mouth daily.    . metFORMIN (GLUCOPHAGE) 500 MG tablet Take 1 tablet (500 mg total) by mouth 2 (two) times daily with a meal. 180 tablet 1  . neomycin-polymyxin-hydrocortisone (CORTISPORIN) 3.5-10000-1 otic suspension PLACE 3 DROPS INTO BOTH EARS 3 TIMES DAILY. 10 mL 0  . olmesartan (BENICAR) 20 MG tablet TAKE 1/2 TABLET DAILY 45 tablet 2  . acetic acid (VOSOL) 2 % otic solution Place 4 drops into the right ear 3 (three) times daily. 15 mL 1  . ciprofloxacin (CIPRO) 250 MG tablet Take 1 tablet (250 mg total) by mouth 2 (two) times daily. 10 tablet 0  . estradiol (ESTRACE) 1 MG tablet Take 1 mg by mouth daily.    Marland Kitchen glucose blood test strip Use as directed. dz E11.1 lifescan, onetouch 100 each 3  . Lancets (FREESTYLE) lancets Reported on 01/15/2016    . LORazepam (ATIVAN) 0.5 MG tablet TAKE 1 TABLET TWICE A DAY AS NEEDED 60 tablet 2  . triamcinolone cream (KENALOG) 0.1 % Apply 1 application topically 2 (two) times daily. 30 g 0   No facility-administered medications prior to visit.     Allergies  Allergen Reactions  . Codeine Nausea And Vomiting    vomiting  . Clarithromycin Rash    Review of Systems  Constitutional: Positive for malaise/fatigue. Negative for fever.  HENT: Positive for congestion.   Eyes: Negative for blurred vision.  Respiratory: Positive for cough, sputum production and shortness of breath.   Cardiovascular: Negative for chest pain, palpitations and leg swelling.  Gastrointestinal: Negative for abdominal pain, blood in stool and nausea.  Genitourinary: Negative for dysuria and frequency.  Musculoskeletal: Negative for falls.  Skin: Negative for rash.  Neurological: Negative for dizziness, loss of consciousness and headaches.  Endo/Heme/Allergies: Negative for environmental allergies.  Psychiatric/Behavioral: Negative for depression. The patient is not nervous/anxious.        Objective:      Physical Exam Vitals signs and nursing note reviewed.  Constitutional:      General: She is not in acute distress.    Appearance: She is well-developed.  HENT:     Head: Normocephalic and atraumatic.     Nose: Nose normal.  Eyes:     General:        Right eye: No discharge.  Left eye: No discharge.  Neck:     Musculoskeletal: Normal range of motion and neck supple.  Cardiovascular:     Rate and Rhythm: Normal rate and regular rhythm.     Heart sounds: No murmur.  Pulmonary:     Effort: Pulmonary effort is normal.     Breath sounds: Normal breath sounds.     Comments: Decreased BS bilateral bases Abdominal:     General: Bowel sounds are normal.     Palpations: Abdomen is soft.     Tenderness: There is no abdominal tenderness.  Skin:    General: Skin is warm and dry.  Neurological:     Mental Status: She is alert and oriented to person, place, and time.     BP 102/70 (BP Location: Left Arm, Patient Position: Sitting, Cuff Size: Normal)   Pulse 86   Temp 98.3 F (36.8 C) (Oral)   Resp 18   Wt 179 lb 3.2 oz (81.3 kg)   SpO2 98%   BMI 32.78 kg/m  Wt Readings from Last 3 Encounters:  11/24/18 179 lb 3.2 oz (81.3 kg)  07/12/18 181 lb 3.2 oz (82.2 kg)  05/23/18 177 lb 12.8 oz (80.6 kg)     Lab Results  Component Value Date   WBC 6.5 11/24/2018   HGB 14.0 11/24/2018   HCT 40.3 11/24/2018   PLT 237.0 11/24/2018   GLUCOSE 191 (H) 11/24/2018   CHOL 152 11/24/2018   TRIG 75.0 11/24/2018   HDL 43.60 11/24/2018   LDLCALC 94 11/24/2018   ALT 17 11/24/2018   AST 12 11/24/2018   NA 139 11/24/2018   K 4.7 11/24/2018   CL 103 11/24/2018   CREATININE 0.75 11/24/2018   BUN 14 11/24/2018   CO2 31 11/24/2018   TSH 1.46 11/24/2018   HGBA1C 8.1 (H) 11/24/2018   MICROALBUR <0.7 09/07/2016    Lab Results  Component Value Date   TSH 1.46 11/24/2018   Lab Results  Component Value Date   WBC 6.5 11/24/2018   HGB 14.0 11/24/2018   HCT 40.3 11/24/2018   MCV  89.1 11/24/2018   PLT 237.0 11/24/2018   Lab Results  Component Value Date   NA 139 11/24/2018   K 4.7 11/24/2018   CO2 31 11/24/2018   GLUCOSE 191 (H) 11/24/2018   BUN 14 11/24/2018   CREATININE 0.75 11/24/2018   BILITOT 0.6 11/24/2018   ALKPHOS 111 11/24/2018   AST 12 11/24/2018   ALT 17 11/24/2018   PROT 6.4 11/24/2018   ALBUMIN 4.2 11/24/2018   CALCIUM 9.4 11/24/2018   GFR 82.52 11/24/2018   Lab Results  Component Value Date   CHOL 152 11/24/2018   Lab Results  Component Value Date   HDL 43.60 11/24/2018   Lab Results  Component Value Date   LDLCALC 94 11/24/2018   Lab Results  Component Value Date   TRIG 75.0 11/24/2018   Lab Results  Component Value Date   CHOLHDL 3 11/24/2018   Lab Results  Component Value Date   HGBA1C 8.1 (H) 11/24/2018       Assessment & Plan:   Problem List Items Addressed This Visit    Diabetes mellitus type 2 in obese (HCC) (Chronic)    hgba1c acceptable, minimize simple carbs. Increase exercise as tolerated.       Relevant Orders   Hemoglobin A1c (Completed)   Comprehensive metabolic panel (Completed)   Hypothyroidism    On Levothyroxine, continue to monitor  Relevant Orders   TSH (Completed)   B12 deficiency    Monitor and supplement      Relevant Orders   CBC (Completed)   Vitamin B12 (Completed)   Anxiety state    Doing well on current meds, no changes. Tolerates Lorazepam prn with good results. UDS and contract UTD      Relevant Medications   LORazepam (ATIVAN) 0.5 MG tablet   Hyperlipidemia associated with type 2 diabetes mellitus (HCC)    Encouraged heart healthy diet, increase exercise, avoid trans fats, consider a krill oil cap daily      Relevant Orders   Lipid panel (Completed)   Bronchitis    Encouraged increased rest and hydration, add probiotics, zinc such as Coldeze or Xicam. Treat fevers as needed. Hydromet prn for cough is permitted       Other Visit Diagnoses    High risk  medication use    -  Primary   Relevant Orders   Pain Mgmt, Profile 8 w/Conf, U (Completed)      I have discontinued Tsion A. Mccosh's estradiol, freestyle, glucose blood, and ciprofloxacin. I have also changed her acetic acid. Additionally, I am having her maintain her Cyanocobalamin (VITAMIN B 12 PO), FREESTYLE LITE, ibuprofen, albuterol, loratadine, neomycin-polymyxin-hydrocortisone, metFORMIN, olmesartan, levothyroxine, triamcinolone cream, HYDROcodone-homatropine, and LORazepam.  Meds ordered this encounter  Medications  . acetic acid 2 % otic solution    Sig: Place 4 drops into the right ear 3 (three) times daily.    Dispense:  15 mL    Refill:  1  . triamcinolone cream (KENALOG) 0.1 %    Sig: Apply 1 application topically 2 (two) times daily.    Dispense:  60 g    Refill:  1  . DISCONTD: LORazepam (ATIVAN) 0.5 MG tablet    Sig: Take 1 tablet (0.5 mg total) by mouth 2 (two) times daily as needed.    Dispense:  60 tablet    Refill:  2    This request is for a new prescription for a controlled substance as required by Federal/State law.  . DISCONTD: LORazepam (ATIVAN) 0.5 MG tablet    Sig: Take 1 tablet (0.5 mg total) by mouth 2 (two) times daily as needed.    Dispense:  60 tablet    Refill:  2    This request is for a new prescription for a controlled substance as required by Federal/State law.  . DISCONTD: HYDROcodone-homatropine (HYCODAN) 5-1.5 MG/5ML syrup    Sig: Take 5 mLs by mouth every 8 (eight) hours as needed for cough.    Dispense:  180 mL    Refill:  0  . HYDROcodone-homatropine (HYCODAN) 5-1.5 MG/5ML syrup    Sig: Take 5 mLs by mouth every 8 (eight) hours as needed for cough.    Dispense:  180 mL    Refill:  0  . LORazepam (ATIVAN) 0.5 MG tablet    Sig: Take 1 tablet (0.5 mg total) by mouth 2 (two) times daily as needed.    Dispense:  60 tablet    Refill:  2    This request is for a new prescription for a controlled substance as required by Federal/State law.      Danise Edge, MD

## 2018-12-24 ENCOUNTER — Other Ambulatory Visit: Payer: Self-pay | Admitting: Family Medicine

## 2019-02-02 ENCOUNTER — Other Ambulatory Visit: Payer: Self-pay | Admitting: Family Medicine

## 2019-02-02 MED ORDER — METFORMIN HCL 500 MG PO TABS: ORAL_TABLET | ORAL | 0 refills | Status: DC

## 2019-02-02 NOTE — Telephone Encounter (Signed)
Pt states she is returning a call back.  But I do not see anyone contacted her. However, pt states at last visit Dr Abner Greenspan increased this med to 5 a day metFORMIN (GLUCOPHAGE) 500 MG tablet  Pt states she has enough of this med for 2 days, then she will be out.

## 2019-02-02 NOTE — Telephone Encounter (Signed)
Reordered by 11/24/18 result notes as ordered. Patient prefers 500 MG tabs. Requested Prescriptions  Pending Prescriptions Disp Refills  . metFORMIN (GLUCOPHAGE) 500 MG tablet 450 tablet 0    Sig: Take 2 tabs by mouth twice daily and I tab at noon.     Endocrinology:  Diabetes - Biguanides Failed - 02/02/2019  9:05 AM      Failed - HBA1C is between 0 and 7.9 and within 180 days    Hgb A1c MFr Bld  Date Value Ref Range Status  11/24/2018 8.1 (H) 4.6 - 6.5 % Final    Comment:    Glycemic Control Guidelines for People with Diabetes:Non Diabetic:  <6%Goal of Therapy: <7%Additional Action Suggested:  >8%          Passed - Cr in normal range and within 360 days    Creat  Date Value Ref Range Status  05/14/2014 0.68 0.50 - 1.10 mg/dL Final   Creatinine, Ser  Date Value Ref Range Status  11/24/2018 0.75 0.40 - 1.20 mg/dL Final         Passed - eGFR in normal range and within 360 days    GFR  Date Value Ref Range Status  11/24/2018 82.52 >60.00 mL/min Final         Passed - Valid encounter within last 6 months    Recent Outpatient Visits          2 months ago High risk medication use   [facility], MD   6 months ago Frequency of urination   [facility], DO   8 months ago Annual physical exam   [facility], MD   1 year ago Cough   [facility], MD   1 year ago Urinary tract infection with hematuria, site unspecified   [facility], DO      Future Appointments            In 1 month Charlett Blake, Bonnita Levan, MD [facility] [place]

## 2019-02-02 NOTE — Telephone Encounter (Signed)
Copied from CRM [id number]. Topic: Quick Communication - Rx Refill/Question >> Feb 02, 2019  8:19 AM Gerrianne Scale wrote: Medication: metFORMIN (GLUCOPHAGE) 500 MG tablet Pt was told to increase her medicine to two in the morn 1 at lunch and 1 at dinner so she ran out early   Has the patient contacted their pharmacy? Yes.   (Agent: If no, request that the patient contact the pharmacy for the refill.) (Agent: If yes, when and what did the pharmacy advise?) yesterday was told to call provider  Preferred Pharmacy (with phone number or street name): [id number] - [place], [place] - [address] [phone] (Phone) [phone] (Fax)    Agent: Please be advised that RX refills may take up to 3 business days. We ask that you follow-up with your pharmacy.

## 2019-03-30 ENCOUNTER — Ambulatory Visit (INDEPENDENT_AMBULATORY_CARE_PROVIDER_SITE_OTHER): Payer: BLUE CROSS/BLUE SHIELD | Admitting: Family Medicine

## 2019-03-30 ENCOUNTER — Other Ambulatory Visit: Payer: Self-pay

## 2019-03-30 DIAGNOSIS — E1169 Type 2 diabetes mellitus with other specified complication: Secondary | ICD-10-CM

## 2019-03-30 DIAGNOSIS — E538 Deficiency of other specified B group vitamins: Secondary | ICD-10-CM

## 2019-03-30 DIAGNOSIS — E669 Obesity, unspecified: Secondary | ICD-10-CM

## 2019-03-30 DIAGNOSIS — E039 Hypothyroidism, unspecified: Secondary | ICD-10-CM

## 2019-03-30 DIAGNOSIS — F411 Generalized anxiety disorder: Secondary | ICD-10-CM

## 2019-03-30 DIAGNOSIS — T7840XD Allergy, unspecified, subsequent encounter: Secondary | ICD-10-CM

## 2019-03-30 DIAGNOSIS — E785 Hyperlipidemia, unspecified: Secondary | ICD-10-CM

## 2019-03-30 NOTE — Assessment & Plan Note (Signed)
On Levothyroxine, continue to monitor 

## 2019-03-30 NOTE — Assessment & Plan Note (Signed)
>>  ASSESSMENT AND PLAN FOR TYPE 2 DIABETES MELLITUS WITH OBESITY WRITTEN ON 04/02/2019  9:38 AM BY BLYTH, STACEY A, MD  hgba1c acceptable, minimize simple carbs. Increase exercise as tolerated. Home BS checks generally in 130s and 140s

## 2019-03-30 NOTE — Assessment & Plan Note (Addendum)
hgba1c acceptable, minimize simple carbs. Increase exercise as tolerated. Home BS checks generally in 130s and 140s

## 2019-03-30 NOTE — Assessment & Plan Note (Signed)
Encouraged heart healthy diet, increase exercise, avoid trans fats, consider a krill oil cap daily 

## 2019-03-30 NOTE — Assessment & Plan Note (Signed)
Monitor and supplement 

## 2019-03-30 NOTE — Assessment & Plan Note (Signed)
Slight flare with heavy pollen load, taking Claritin daily can consider bid and nasal saline prn

## 2019-04-02 NOTE — Assessment & Plan Note (Signed)
Managing fairly well despite the pandemic. Blood pressure had been labile but recent numbers 134/80, 129/73, 131/71 with pulses in 80s are reassuring.

## 2019-04-02 NOTE — Progress Notes (Signed)
Virtual Visit via Video Note  I connected with Tiffany Mckenzie on 04/02/19 at  8:40 AM EDT by a video enabled telemedicine application and verified that I am speaking with the correct person using two identifiers.   I discussed the limitations of evaluation and management by telemedicine and the availability of in person appointments. The patient expressed understanding and agreed to proceed. Princess Montez MoritaCarter CMA was able to get the patient set up on video platform    Subjective:    Patient ID: Tiffany Mckenzie, female    DOB: 03/28/1956, 63 y.o.   MRN: [id number]  No chief complaint on file.   HPI Patient is in today for follow up on chronic medical concerns including diabetes, hyperlipidemia, anxiety and some recent labile blood pressures. We review her home BP readings and they are noted in A/P and are good. Her pulses are in the 80s and she feels well. No recent febrile illness or hospitalizations. Her anxiety is manageable givent he current state. Her finger stick glucose numbers generally in the 130s and 140s. No polyuria or polydipsia noted. Denies CP/palp/SOB/HA/congestion/fevers/GI or GU c/o. Taking meds as prescribed  Past Medical History:  Diagnosis Date  . Allergic rhinitis   . Allergic state 03/03/2015  . Annual physical exam 02/09/2012  . Anxiety state 09/30/2016  . Arthritis   . Asthma   . Constipation 03/03/2015  . Diabetes mellitus type 2 in obese (HCC) 09/13/2012  . History of chicken pox   . History of hepatitis    age 89  . Hyperglycemia 09/13/2012  . Hypertension   . Hypothyroidism   . Left carotid bruit 03/11/2016  . Obesity 03/28/2017  . Overactive bladder 03/17/2013   Seeing urology  . Raynaud phenomenon 03/03/2015  . Type II or unspecified type diabetes mellitus without mention of complication, uncontrolled 09/13/2012    Past Surgical History:  Procedure Laterality Date  . ABDOMINAL HYSTERECTOMY  2002  . TONSILLECTOMY      Family History  Problem Relation Age of  Onset  . Alcohol abuse Father   . Cancer Father        lung  . Diabetes Father   . Arthritis Mother   . Heart disease Mother        Blockage & double bypass  . Hypertension Mother   . Diabetes Mother   . Colon cancer Paternal Grandmother   . Stroke Paternal Grandmother        maternal grandfather  . Breast cancer Maternal Grandmother   . Heart disease Maternal Grandfather   . Scoliosis Daughter   . Cancer Paternal Grandfather        lung    Social History   Socioeconomic History  . Marital status: Married    Spouse name: Not on file  . Number of children: Not on file  . Years of education: Not on file  . Highest education level: Not on file  Occupational History  . Not on file  Social Needs  . Financial resource strain: Not on file  . Food insecurity:    Worry: Not on file    Inability: Not on file  . Transportation needs:    Medical: Not on file    Non-medical: Not on file  Tobacco Use  . Smoking status: Never Smoker  . Smokeless tobacco: Never Used  Substance and Sexual Activity  . Alcohol use: Yes    Comment: less than once a month, white wine or mixed drink  . Drug use: No  .  Sexual activity: Not on file  Lifestyle  . Physical activity:    Days per week: Not on file    Minutes per session: Not on file  . Stress: Not on file  Relationships  . Social connections:    Talks on phone: Not on file    Gets together: Not on file    Attends religious service: Not on file    Active member of club or organization: Not on file    Attends meetings of clubs or organizations: Not on file    Relationship status: Not on file  . Intimate partner violence:    Fear of current or ex partner: Not on file    Emotionally abused: Not on file    Physically abused: Not on file    Forced sexual activity: Not on file  Other Topics Concern  . Not on file  Social History Narrative  . Not on file    Outpatient Medications Prior to Visit  Medication Sig Dispense Refill  .  acetic acid 2 % otic solution Place 4 drops into the right ear 3 (three) times daily. 15 mL 1  . albuterol (VENTOLIN HFA) 108 (90 Base) MCG/ACT inhaler Inhale 2 puffs into the lungs every 6 (six) hours as needed for wheezing or shortness of breath. 1 Inhaler 1  . Blood Glucose Monitoring Suppl (FREESTYLE LITE) DEVI Reported on 01/15/2016    . Cyanocobalamin (VITAMIN B 12 PO) Take 1,000 mcg by mouth daily.    Marland Kitchen ibuprofen (ADVIL,MOTRIN) 800 MG tablet Take 1 tablet (800 mg total) by mouth 3 (three) times daily. 21 tablet 0  . levothyroxine (SYNTHROID, LEVOTHROID) 100 MCG tablet TAKE 1 TABLET DAILY BEFORE BREAKFAST 90 tablet 1  . loratadine (CLARITIN) 10 MG tablet Take 10 mg by mouth daily.    Marland Kitchen LORazepam (ATIVAN) 0.5 MG tablet Take 1 tablet (0.5 mg total) by mouth 2 (two) times daily as needed. 60 tablet 2  . metFORMIN (GLUCOPHAGE) 500 MG tablet Take 2 tabs by mouth twice daily and I tab at noon. 450 tablet 0  . neomycin-polymyxin-hydrocortisone (CORTISPORIN) 3.5-10000-1 otic suspension PLACE 3 DROPS INTO BOTH EARS 3 TIMES DAILY. 10 mL 0  . olmesartan (BENICAR) 20 MG tablet TAKE 1/2 TABLET DAILY 45 tablet 2  . triamcinolone cream (KENALOG) 0.1 % Apply 1 application topically 2 (two) times daily. 60 g 1  . HYDROcodone-homatropine (HYCODAN) 5-1.5 MG/5ML syrup Take 5 mLs by mouth every 8 (eight) hours as needed for cough. 180 mL 0   No facility-administered medications prior to visit.     Allergies  Allergen Reactions  . Codeine Nausea And Vomiting    vomiting  . Clarithromycin Rash    Review of Systems  Constitutional: Negative for fever and malaise/fatigue.  HENT: Negative for congestion.   Eyes: Negative for blurred vision.  Respiratory: Negative for shortness of breath.   Cardiovascular: Negative for chest pain, palpitations and leg swelling.  Gastrointestinal: Negative for abdominal pain, blood in stool and nausea.  Genitourinary: Negative for dysuria and frequency.  Musculoskeletal:  Negative for falls.  Skin: Negative for rash.  Neurological: Negative for dizziness, loss of consciousness and headaches.  Endo/Heme/Allergies: Negative for environmental allergies.  Psychiatric/Behavioral: Negative for depression. The patient is not nervous/anxious.        Objective:    Physical Exam Constitutional:      Appearance: Normal appearance.  HENT:     Head: Normocephalic and atraumatic.     Nose: Nose normal.  Pulmonary:  Effort: Pulmonary effort is normal.  Neurological:     Mental Status: She is alert and oriented to person, place, and time.  Psychiatric:        Mood and Affect: Mood normal.        Behavior: Behavior normal.     Wt 173 lb (78.5 kg)   BMI 31.64 kg/m  Wt Readings from Last 3 Encounters:  03/30/19 173 lb (78.5 kg)  11/24/18 179 lb 3.2 oz (81.3 kg)  07/12/18 181 lb 3.2 oz (82.2 kg)    Diabetic Foot Exam - Simple   No data filed     Lab Results  Component Value Date   WBC 6.5 11/24/2018   HGB 14.0 11/24/2018   HCT 40.3 11/24/2018   PLT 237.0 11/24/2018   GLUCOSE 191 (H) 11/24/2018   CHOL 152 11/24/2018   TRIG 75.0 11/24/2018   HDL 43.60 11/24/2018   LDLCALC 94 11/24/2018   ALT 17 11/24/2018   AST 12 11/24/2018   NA 139 11/24/2018   K 4.7 11/24/2018   CL 103 11/24/2018   CREATININE 0.75 11/24/2018   BUN 14 11/24/2018   CO2 31 11/24/2018   TSH 1.46 11/24/2018   HGBA1C 8.1 (H) 11/24/2018   MICROALBUR <0.7 09/07/2016    Lab Results  Component Value Date   TSH 1.46 11/24/2018   Lab Results  Component Value Date   WBC 6.5 11/24/2018   HGB 14.0 11/24/2018   HCT 40.3 11/24/2018   MCV 89.1 11/24/2018   PLT 237.0 11/24/2018   Lab Results  Component Value Date   NA 139 11/24/2018   K 4.7 11/24/2018   CO2 31 11/24/2018   GLUCOSE 191 (H) 11/24/2018   BUN 14 11/24/2018   CREATININE 0.75 11/24/2018   BILITOT 0.6 11/24/2018   ALKPHOS 111 11/24/2018   AST 12 11/24/2018   ALT 17 11/24/2018   PROT 6.4 11/24/2018    ALBUMIN 4.2 11/24/2018   CALCIUM 9.4 11/24/2018   GFR 82.52 11/24/2018   Lab Results  Component Value Date   CHOL 152 11/24/2018   Lab Results  Component Value Date   HDL 43.60 11/24/2018   Lab Results  Component Value Date   LDLCALC 94 11/24/2018   Lab Results  Component Value Date   TRIG 75.0 11/24/2018   Lab Results  Component Value Date   CHOLHDL 3 11/24/2018   Lab Results  Component Value Date   HGBA1C 8.1 (H) 11/24/2018       Assessment & Plan:   Problem List Items Addressed This Visit    Diabetes mellitus type 2 in obese (HCC) (Chronic)    hgba1c acceptable, minimize simple carbs. Increase exercise as tolerated. Home BS checks generally in 130s and 140s      Hypothyroidism    On Levothyroxine, continue to monitor      B12 deficiency    Monitor and supplement      Allergy    Slight flare with heavy pollen load, taking Claritin daily can consider bid and nasal saline prn      Anxiety state    Managing fairly well despite the pandemic. Blood pressure had been labile but recent numbers 134/80, 129/73, 131/71 with pulses in 80s are reassuring.       Hyperlipidemia associated with type 2 diabetes mellitus (HCC)    Encouraged heart healthy diet, increase exercise, avoid trans fats, consider a krill oil cap daily         I have discontinued Tiffany Mckenzie  HYDROcodone-homatropine. I am also having her maintain her Cyanocobalamin (VITAMIN B 12 PO), FreeStyle Lite, ibuprofen, albuterol, loratadine, neomycin-polymyxin-hydrocortisone, olmesartan, levothyroxine, acetic acid, triamcinolone cream, LORazepam, and metFORMIN.  No orders of the defined types were placed in this encounter.    I discussed the assessment and treatment plan with the patient. The patient was provided an opportunity to ask questions and all were answered. The patient agreed with the plan and demonstrated an understanding of the instructions.   The patient was advised to call back or  seek an in-person evaluation if the symptoms worsen or if the condition fails to improve as anticipated  I provided 25 minutes of non-face-to-face time during this encounter.   Danise Edge, MD

## 2019-04-26 ENCOUNTER — Other Ambulatory Visit: Payer: Self-pay | Admitting: Family Medicine

## 2019-05-03 ENCOUNTER — Other Ambulatory Visit: Payer: Self-pay | Admitting: Family Medicine

## 2019-05-03 NOTE — Telephone Encounter (Signed)
Copied from CRM [id number]. Topic: Quick Communication - Rx Refill/Question >> May 03, 2019  4:54 PM Mcneil, Ja-Kwan wrote: Medication: levothyroxine (SYNTHROID, LEVOTHROID) 100 MCG tablet  Has the patient contacted their pharmacy? yes   Preferred Pharmacy (with phone number or street name): [id number] - [place], [place] - [address] [phone] (Phone) [phone] (Fax)  Agent: Please be advised that RX refills may take up to 3 business days. We ask that you follow-up with your pharmacy.

## 2019-05-04 MED ORDER — LEVOTHYROXINE SODIUM 100 MCG PO TABS: 100.0000 ug | ORAL_TABLET | Freq: Every day | ORAL | 1 refills | Status: DC

## 2019-05-04 NOTE — Telephone Encounter (Signed)
Rx sent 

## 2019-05-25 DIAGNOSIS — H43393 Other vitreous opacities, bilateral: Secondary | ICD-10-CM | POA: Diagnosis not present

## 2019-05-25 DIAGNOSIS — H40023 Open angle with borderline findings, high risk, bilateral: Secondary | ICD-10-CM | POA: Diagnosis not present

## 2019-05-25 DIAGNOSIS — Z7984 Long term (current) use of oral hypoglycemic drugs: Secondary | ICD-10-CM | POA: Diagnosis not present

## 2019-05-25 DIAGNOSIS — E119 Type 2 diabetes mellitus without complications: Secondary | ICD-10-CM | POA: Diagnosis not present

## 2019-06-06 ENCOUNTER — Ambulatory Visit (INDEPENDENT_AMBULATORY_CARE_PROVIDER_SITE_OTHER): Payer: BC Managed Care – PPO | Admitting: Family Medicine

## 2019-06-06 ENCOUNTER — Other Ambulatory Visit: Payer: Self-pay

## 2019-06-06 VITALS — BP 123/72 | HR 82 | Wt 172.0 lb

## 2019-06-06 DIAGNOSIS — E538 Deficiency of other specified B group vitamins: Secondary | ICD-10-CM | POA: Diagnosis not present

## 2019-06-06 DIAGNOSIS — E669 Obesity, unspecified: Secondary | ICD-10-CM

## 2019-06-06 DIAGNOSIS — E1169 Type 2 diabetes mellitus with other specified complication: Secondary | ICD-10-CM | POA: Diagnosis not present

## 2019-06-06 DIAGNOSIS — R300 Dysuria: Secondary | ICD-10-CM

## 2019-06-06 DIAGNOSIS — Z79899 Other long term (current) drug therapy: Secondary | ICD-10-CM

## 2019-06-06 DIAGNOSIS — F411 Generalized anxiety disorder: Secondary | ICD-10-CM

## 2019-06-06 DIAGNOSIS — E785 Hyperlipidemia, unspecified: Secondary | ICD-10-CM

## 2019-06-06 DIAGNOSIS — E039 Hypothyroidism, unspecified: Secondary | ICD-10-CM

## 2019-06-06 DIAGNOSIS — L719 Rosacea, unspecified: Secondary | ICD-10-CM

## 2019-06-06 DIAGNOSIS — J45909 Unspecified asthma, uncomplicated: Secondary | ICD-10-CM

## 2019-06-06 MED ORDER — OLMESARTAN MEDOXOMIL 20 MG PO TABS: 10.0000 mg | ORAL_TABLET | Freq: Every day | ORAL | 2 refills | Status: DC

## 2019-06-06 MED ORDER — LORAZEPAM 0.5 MG PO TABS: 0.5000 mg | ORAL_TABLET | Freq: Two times a day (BID) | ORAL | 2 refills | Status: DC | PRN

## 2019-06-06 MED ORDER — TETRACYCLINE HCL 250 MG PO CAPS: 250.0000 mg | ORAL_CAPSULE | Freq: Two times a day (BID) | ORAL | 0 refills | Status: DC

## 2019-06-06 NOTE — Assessment & Plan Note (Signed)
Flared so will try tetracycline and witch hazel astringent

## 2019-06-06 NOTE — Assessment & Plan Note (Signed)
Stable and given a refill given a refill on Lorazepam

## 2019-06-06 NOTE — Assessment & Plan Note (Signed)
>>  ASSESSMENT AND PLAN FOR TYPE 2 DIABETES MELLITUS WITH OBESITY WRITTEN ON 06/06/2019  9:07 AM BY BLYTH, STACEY A, MD  hgba1c acceptable, minimize simple carbs. Increase exercise as tolerated. Continue current meds

## 2019-06-06 NOTE — Assessment & Plan Note (Signed)
On Levothyroxine, continue to monitor 

## 2019-06-06 NOTE — Progress Notes (Signed)
Virtual Visit via Video Note  I connected with Tiffany Mckenzie on 06/06/19 at  8:40 AM EDT by a video enabled telemedicine application and verified that I am speaking with the correct person using two identifiers.  Location: Patient: home Provider: home   I discussed the limitations of evaluation and management by telemedicine and the availability of in person appointments. The patient expressed understanding and agreed to proceed. Tiffany Mckenzie was able to get the patient set up on a video visit.     Subjective:    Patient ID: Tiffany FrederickAnn A Mckenzie, female    DOB: 01/02/1956, 63 y.o.   MRN: [id number]  No chief complaint on file.   HPI Patient is in today for follow up on chronic medical concerns including rosacea, diabetes, vitamin B12 deficiency, hypothyroidism and more.  She feels well today.  No recent febrile illness or hospitalizations.  No polyuria or polydipsia.  Her fasting blood sugars have been ranging in the 140s.  She is trying to maintain a heart healthy diet and is doing a good job with Probation officermaintaining quarantine.  Other than her rosacea flaring on her face with acne and irritation recently she has been feeling well. hgba1c acceptable, minimize simple carbs. Increase exercise as tolerated. Continue current meds  Past Medical History:  Diagnosis Date  . Allergic rhinitis   . Allergic state 03/03/2015  . Annual physical exam 02/09/2012  . Anxiety state 09/30/2016  . Arthritis   . Asthma   . Constipation 03/03/2015  . Diabetes mellitus type 2 in obese (HCC) 09/13/2012  . History of chicken pox   . History of hepatitis    age 89  . Hyperglycemia 09/13/2012  . Hypertension   . Hypothyroidism   . Left carotid bruit 03/11/2016  . Obesity 03/28/2017  . Overactive bladder 03/17/2013   Seeing urology  . Raynaud phenomenon 03/03/2015  . Type II or unspecified type diabetes mellitus without mention of complication, uncontrolled 09/13/2012    Past Surgical History:  Procedure Laterality  Date  . ABDOMINAL HYSTERECTOMY  2002  . TONSILLECTOMY      Family History  Problem Relation Age of Onset  . Alcohol abuse Father   . Cancer Father        lung  . Diabetes Father   . Arthritis Mother   . Heart disease Mother        Blockage & double bypass  . Hypertension Mother   . Diabetes Mother   . Colon cancer Paternal Grandmother   . Stroke Paternal Grandmother        maternal grandfather  . Breast cancer Maternal Grandmother   . Heart disease Maternal Grandfather   . Scoliosis Daughter   . Cancer Paternal Grandfather        lung    Social History   Socioeconomic History  . Marital status: Married    Spouse name: Not on file  . Number of children: Not on file  . Years of education: Not on file  . Highest education level: Not on file  Occupational History  . Not on file  Social Needs  . Financial resource strain: Not on file  . Food insecurity    Worry: Not on file    Inability: Not on file  . Transportation needs    Medical: Not on file    Non-medical: Not on file  Tobacco Use  . Smoking status: Never Smoker  . Smokeless tobacco: Never Used  Substance and Sexual Activity  . Alcohol  use: Yes    Comment: less than once a month, white wine or mixed drink  . Drug use: No  . Sexual activity: Not on file  Lifestyle  . Physical activity    Days per week: Not on file    Minutes per session: Not on file  . Stress: Not on file  Relationships  . Social Musicianconnections    Talks on phone: Not on file    Gets together: Not on file    Attends religious service: Not on file    Active member of club or organization: Not on file    Attends meetings of clubs or organizations: Not on file    Relationship status: Not on file  . Intimate partner violence    Fear of current or ex partner: Not on file    Emotionally abused: Not on file    Physically abused: Not on file    Forced sexual activity: Not on file  Other Topics Concern  . Not on file  Social History  Narrative  . Not on file    Outpatient Medications Prior to Visit  Medication Sig Dispense Refill  . acetic acid 2 % otic solution Place 4 drops into the right ear 3 (three) times daily. 15 mL 1  . albuterol (VENTOLIN HFA) 108 (90 Base) MCG/ACT inhaler Inhale 2 puffs into the lungs every 6 (six) hours as needed for wheezing or shortness of breath. 1 Inhaler 1  . Blood Glucose Monitoring Suppl (FREESTYLE LITE) DEVI Reported on 01/15/2016    . Cyanocobalamin (VITAMIN B 12 PO) Take 1,000 mcg by mouth daily.    Marland Kitchen. ibuprofen (ADVIL,MOTRIN) 800 MG tablet Take 1 tablet (800 mg total) by mouth 3 (three) times daily. 21 tablet 0  . levothyroxine (SYNTHROID) 100 MCG tablet Take 1 tablet (100 mcg total) by mouth daily before breakfast. 90 tablet 1  . loratadine (CLARITIN) 10 MG tablet Take 10 mg by mouth daily.    . metFORMIN (GLUCOPHAGE) 500 MG tablet TAKE 2 TABLETS IN THE MORNING, 1 TABLET AT NOON AND 2 TABLETS IN THE EVENING 450 tablet 0  . neomycin-polymyxin-hydrocortisone (CORTISPORIN) 3.5-10000-1 otic suspension PLACE 3 DROPS INTO BOTH EARS 3 TIMES DAILY. 10 mL 0  . triamcinolone cream (KENALOG) 0.1 % Apply 1 application topically 2 (two) times daily. 60 g 1  . LORazepam (ATIVAN) 0.5 MG tablet Take 1 tablet (0.5 mg total) by mouth 2 (two) times daily as needed. 60 tablet 2  . olmesartan (BENICAR) 20 MG tablet TAKE 1/2 TABLET DAILY 45 tablet 2   No facility-administered medications prior to visit.     Allergies  Allergen Reactions  . Codeine Nausea And Vomiting    vomiting  . Clarithromycin Rash    Review of Systems  Constitutional: Negative for fever and malaise/fatigue.  HENT: Negative for congestion.   Eyes: Negative for blurred vision.  Respiratory: Negative for shortness of breath.   Cardiovascular: Negative for chest pain, palpitations and leg swelling.  Gastrointestinal: Negative for abdominal pain, blood in stool and nausea.  Genitourinary: Negative for dysuria and frequency.   Musculoskeletal: Negative for falls.  Skin: Positive for rash.  Neurological: Negative for dizziness, loss of consciousness and headaches.  Endo/Heme/Allergies: Negative for environmental allergies.  Psychiatric/Behavioral: Negative for depression. The patient is not nervous/anxious.        Objective:    Physical Exam Constitutional:      Appearance: Normal appearance. She is not ill-appearing.  HENT:     Head: Normocephalic and  atraumatic.     Nose: Nose normal.  Eyes:     General:        Right eye: No discharge.        Left eye: No discharge.  Pulmonary:     Effort: Pulmonary effort is normal.  Neurological:     Mental Status: She is alert and oriented to person, place, and time.  Psychiatric:        Mood and Affect: Mood normal.        Behavior: Behavior normal.     BP 123/72 (BP Location: Left Arm, Patient Position: Sitting, Cuff Size: Normal)   Pulse 82   Wt 172 lb (78 kg)   BMI 31.46 kg/m  Wt Readings from Last 3 Encounters:  06/06/19 172 lb (78 kg)  03/30/19 173 lb (78.5 kg)  11/24/18 179 lb 3.2 oz (81.3 kg)    Diabetic Foot Exam - Simple   No data filed     Lab Results  Component Value Date   WBC 6.5 11/24/2018   HGB 14.0 11/24/2018   HCT 40.3 11/24/2018   PLT 237.0 11/24/2018   GLUCOSE 191 (H) 11/24/2018   CHOL 152 11/24/2018   TRIG 75.0 11/24/2018   HDL 43.60 11/24/2018   LDLCALC 94 11/24/2018   ALT 17 11/24/2018   AST 12 11/24/2018   NA 139 11/24/2018   K 4.7 11/24/2018   CL 103 11/24/2018   CREATININE 0.75 11/24/2018   BUN 14 11/24/2018   CO2 31 11/24/2018   TSH 1.46 11/24/2018   HGBA1C 8.1 (H) 11/24/2018   MICROALBUR <0.7 09/07/2016    Lab Results  Component Value Date   TSH 1.46 11/24/2018   Lab Results  Component Value Date   WBC 6.5 11/24/2018   HGB 14.0 11/24/2018   HCT 40.3 11/24/2018   MCV 89.1 11/24/2018   PLT 237.0 11/24/2018   Lab Results  Component Value Date   NA 139 11/24/2018   K 4.7 11/24/2018   CO2 31  11/24/2018   GLUCOSE 191 (H) 11/24/2018   BUN 14 11/24/2018   CREATININE 0.75 11/24/2018   BILITOT 0.6 11/24/2018   ALKPHOS 111 11/24/2018   AST 12 11/24/2018   ALT 17 11/24/2018   PROT 6.4 11/24/2018   ALBUMIN 4.2 11/24/2018   CALCIUM 9.4 11/24/2018   GFR 82.52 11/24/2018   Lab Results  Component Value Date   CHOL 152 11/24/2018   Lab Results  Component Value Date   HDL 43.60 11/24/2018   Lab Results  Component Value Date   LDLCALC 94 11/24/2018   Lab Results  Component Value Date   TRIG 75.0 11/24/2018   Lab Results  Component Value Date   CHOLHDL 3 11/24/2018   Lab Results  Component Value Date   HGBA1C 8.1 (H) 11/24/2018       Assessment & Plan:   Problem List Items Addressed This Visit    Diabetes mellitus type 2 in obese (HCC) (Chronic)    hgba1c acceptable, minimize simple carbs. Increase exercise as tolerated. Continue current meds      Relevant Medications   olmesartan (BENICAR) 20 MG tablet   Other Relevant Orders   Hemoglobin A1c   CBC   Comprehensive metabolic panel   Microalbumin / creatinine urine ratio   Asthma    No recent exacerbations      Hypothyroidism    On Levothyroxine, continue to monitor.       Relevant Orders   TSH   Rosacea  Flared so will try tetracycline and witch hazel astringent       B12 deficiency    Supplement and monitor      Relevant Orders   Vitamin B12   Anxiety state    Stable and given a refill given a refill on Lorazepam      Relevant Medications   LORazepam (ATIVAN) 0.5 MG tablet   Hyperlipidemia associated with type 2 diabetes mellitus (HCC)   Relevant Medications   olmesartan (BENICAR) 20 MG tablet   Other Relevant Orders   Lipid panel    Other Visit Diagnoses    High risk medication use    -  Primary   Relevant Orders   Pain Mgmt, Profile 8 w/Conf, U   Dysuria       Relevant Orders   Urinalysis   Urine Culture      I have changed Aleiah A. Donovan's olmesartan. I am also having  her start on tetracycline. Additionally, I am having her maintain her Cyanocobalamin (VITAMIN B 12 PO), FreeStyle Lite, ibuprofen, albuterol, loratadine, neomycin-polymyxin-hydrocortisone, acetic acid, triamcinolone cream, metFORMIN, levothyroxine, and LORazepam.  Meds ordered this encounter  Medications  . olmesartan (BENICAR) 20 MG tablet    Sig: Take 0.5 tablets (10 mg total) by mouth daily.    Dispense:  45 tablet    Refill:  2  . LORazepam (ATIVAN) 0.5 MG tablet    Sig: Take 1 tablet (0.5 mg total) by mouth 2 (two) times daily as needed.    Dispense:  60 tablet    Refill:  2    This request is for a new prescription for a controlled substance as required by Federal/State law.  . tetracycline (SUMYCIN) 250 MG capsule    Sig: Take 1 capsule (250 mg total) by mouth 2 (two) times daily before a meal.    Dispense:  20 capsule    Refill:  0   I discussed the assessment and treatment plan with the patient. The patient was provided an opportunity to ask questions and all were answered. The patient agreed with the plan and demonstrated an understanding of the instructions.   The patient was advised to call back or seek an in-person evaluation if the symptoms worsen or if the condition fails to improve as anticipated.  I provided 25 minutes of non-face-to-face time during this encounter.   Penni Homans, MD

## 2019-06-06 NOTE — Assessment & Plan Note (Signed)
No recent exacerbations.  

## 2019-06-06 NOTE — Assessment & Plan Note (Signed)
Supplement and monitor 

## 2019-06-06 NOTE — Assessment & Plan Note (Signed)
hgba1c acceptable, minimize simple carbs. Increase exercise as tolerated. Continue current meds 

## 2019-06-12 ENCOUNTER — Other Ambulatory Visit (INDEPENDENT_AMBULATORY_CARE_PROVIDER_SITE_OTHER): Payer: BC Managed Care – PPO

## 2019-06-12 ENCOUNTER — Other Ambulatory Visit: Payer: Self-pay

## 2019-06-12 DIAGNOSIS — E669 Obesity, unspecified: Secondary | ICD-10-CM

## 2019-06-12 DIAGNOSIS — E538 Deficiency of other specified B group vitamins: Secondary | ICD-10-CM | POA: Diagnosis not present

## 2019-06-12 DIAGNOSIS — R300 Dysuria: Secondary | ICD-10-CM | POA: Diagnosis not present

## 2019-06-12 DIAGNOSIS — E039 Hypothyroidism, unspecified: Secondary | ICD-10-CM

## 2019-06-12 DIAGNOSIS — E1169 Type 2 diabetes mellitus with other specified complication: Secondary | ICD-10-CM | POA: Diagnosis not present

## 2019-06-12 DIAGNOSIS — Z79899 Other long term (current) drug therapy: Secondary | ICD-10-CM

## 2019-06-12 DIAGNOSIS — E785 Hyperlipidemia, unspecified: Secondary | ICD-10-CM

## 2019-06-12 LAB — URINALYSIS
Bilirubin Urine: NEGATIVE
Hgb urine dipstick: NEGATIVE
Ketones, ur: NEGATIVE
Leukocytes,Ua: NEGATIVE
Nitrite: NEGATIVE
Specific Gravity, Urine: 1.02 (ref 1.000–1.030)
Total Protein, Urine: NEGATIVE
Urine Glucose: NEGATIVE
Urobilinogen, UA: 0.2 (ref 0.0–1.0)
pH: 5 (ref 5.0–8.0)

## 2019-06-12 LAB — COMPREHENSIVE METABOLIC PANEL
ALT: 20 U/L (ref 0–35)
AST: 13 U/L (ref 0–37)
Albumin: 3.9 g/dL (ref 3.5–5.2)
Alkaline Phosphatase: 106 U/L (ref 39–117)
BUN: 15 mg/dL (ref 6–23)
CO2: 25 mEq/L (ref 19–32)
Calcium: 8.8 mg/dL (ref 8.4–10.5)
Chloride: 105 mEq/L (ref 96–112)
Creatinine, Ser: 0.72 mg/dL (ref 0.40–1.20)
GFR: 81.25 mL/min (ref >60.00)
Glucose, Bld: 179 mg/dL — ABNORMAL HIGH (ref 70–99)
Potassium: 4.2 mEq/L (ref 3.5–5.1)
Sodium: 140 mEq/L (ref 135–145)
Total Bilirubin: 0.6 mg/dL (ref 0.2–1.2)
Total Protein: 5.9 g/dL — ABNORMAL LOW (ref 6.0–8.3)

## 2019-06-12 LAB — LIPID PANEL
Cholesterol: 141 mg/dL (ref 0–200)
HDL: 42.1 mg/dL (ref >39.00)
LDL Cholesterol: 85 mg/dL (ref 0–99)
NonHDL: 98.58
Total CHOL/HDL Ratio: 3
Triglycerides: 68 mg/dL (ref 0.0–149.0)
VLDL: 13.6 mg/dL (ref 0.0–40.0)

## 2019-06-12 LAB — CBC
HCT: 38.2 % (ref 36.0–46.0)
Hemoglobin: 12.9 g/dL (ref 12.0–15.0)
MCHC: 33.8 g/dL (ref 30.0–36.0)
MCV: 89.3 fl (ref 78.0–100.0)
Platelets: 227 10*3/uL (ref 150.0–400.0)
RBC: 4.28 Mil/uL (ref 3.87–5.11)
RDW: 12.9 % (ref 11.5–15.5)
WBC: 5.8 10*3/uL (ref 4.0–10.5)

## 2019-06-12 LAB — MICROALBUMIN / CREATININE URINE RATIO
Creatinine,U: 80.9 mg/dL
Microalb Creat Ratio: 0.9 mg/g (ref 0.0–30.0)
Microalb, Ur: <0.7 mg/dL (ref 0.0–1.9)

## 2019-06-12 LAB — VITAMIN B12: Vitamin B-12: 175 pg/mL — ABNORMAL LOW (ref 211–911)

## 2019-06-12 LAB — HEMOGLOBIN A1C: Hgb A1c MFr Bld: 7.5 % — ABNORMAL HIGH (ref 4.6–6.5)

## 2019-06-12 LAB — TSH: TSH: 0.51 u[IU]/mL (ref 0.35–4.50)

## 2019-06-13 LAB — PAIN MGMT, PROFILE 8 W/CONF, U
6 Acetylmorphine: NEGATIVE ng/mL
Alcohol Metabolites: NEGATIVE ng/mL (ref <500)
Amphetamines: NEGATIVE ng/mL
Benzodiazepines: NEGATIVE ng/mL
Buprenorphine, Urine: NEGATIVE ng/mL
Cocaine Metabolite: NEGATIVE ng/mL
Creatinine: 81.5 mg/dL
MDMA: NEGATIVE ng/mL
Marijuana Metabolite: NEGATIVE ng/mL
Opiates: NEGATIVE ng/mL
Oxidant: NEGATIVE ug/mL
Oxycodone: NEGATIVE ng/mL
pH: 5.1 (ref 4.5–9.0)

## 2019-06-14 LAB — URINE CULTURE
MICRO NUMBER:: 617140
SPECIMEN QUALITY:: ADEQUATE

## 2019-07-04 DIAGNOSIS — Z1231 Encounter for screening mammogram for malignant neoplasm of breast: Secondary | ICD-10-CM | POA: Diagnosis not present

## 2019-07-04 LAB — HM MAMMOGRAPHY

## 2019-07-18 ENCOUNTER — Other Ambulatory Visit: Payer: Self-pay

## 2019-07-18 ENCOUNTER — Encounter: Payer: Self-pay | Admitting: Family Medicine

## 2019-07-18 ENCOUNTER — Ambulatory Visit (INDEPENDENT_AMBULATORY_CARE_PROVIDER_SITE_OTHER): Payer: BC Managed Care – PPO | Admitting: Family Medicine

## 2019-07-18 DIAGNOSIS — Z7189 Other specified counseling: Secondary | ICD-10-CM

## 2019-07-18 NOTE — Progress Notes (Signed)
CC: Covid exposure  Subjective: Patient is a 63 y.o. female here for covid exposure. Due to COVID-19 pandemic, we are interacting via web portal for an electronic face-to-face visit. I verified patient's ID using 2 identifiers. Patient agreed to proceed with visit via this method. Patient is at home, I am at office. Patient and I are present for visit.   9 d ago was exposed to a friend who eventually tested positive for COVID-19. She has no s/s's including digestive s/s's, URI s/s's, aches, or fevers. Pt's friend had no s/s's at time of exposure. 3 days after contact, pt's friend had s/s's and tested positive at the end of last week. She has also been in contact w her sister who has not had any symptoms. Curious if she needs to have testing herself.   ROS: Const: denies fevers Lungs: Denies SOB   Past Medical History:  Diagnosis Date  . Allergic rhinitis   . Allergic state 03/03/2015  . Annual physical exam 02/09/2012  . Anxiety state 09/30/2016  . Arthritis   . Asthma   . Constipation 03/03/2015  . Diabetes mellitus type 2 in obese ([place]) 09/13/2012  . History of chicken pox   . History of hepatitis    age 24  . Hyperglycemia 09/13/2012  . Hypertension   . Hypothyroidism   . Left carotid bruit 03/11/2016  . Obesity 03/28/2017  . Overactive bladder 03/17/2013   Seeing urology  . Raynaud phenomenon 03/03/2015  . Type II or unspecified type diabetes mellitus without mention of complication, uncontrolled 09/13/2012    Objective: No conversational dyspnea Age appropriate judgment and insight Nml affect and mood  Assessment and Plan: Educated About Covid-19 Virus Infection - Plan: offered testing. Not mandatory given her s/s's. Rec'd quarantine for next 5 d. Let me know if she changes mind about testing.   The patient voiced understanding and agreement to the plan.  [place], DO 07/18/19  1:01 PM

## 2019-07-27 ENCOUNTER — Other Ambulatory Visit: Payer: Self-pay | Admitting: Family Medicine

## 2019-10-26 ENCOUNTER — Other Ambulatory Visit: Payer: Self-pay | Admitting: Family Medicine

## 2019-11-20 ENCOUNTER — Other Ambulatory Visit: Payer: Self-pay

## 2019-11-20 ENCOUNTER — Encounter: Payer: Self-pay | Admitting: Medical

## 2019-11-20 ENCOUNTER — Ambulatory Visit (INDEPENDENT_AMBULATORY_CARE_PROVIDER_SITE_OTHER): Payer: BC Managed Care – PPO | Admitting: Medical

## 2019-11-20 VITALS — BP 111/64 | HR 89 | Temp 97.0°F | Resp 12 | Ht 62.0 in | Wt 174.6 lb

## 2019-11-20 DIAGNOSIS — R300 Dysuria: Secondary | ICD-10-CM | POA: Diagnosis not present

## 2019-11-20 LAB — POC URINALSYSI DIPSTICK (AUTOMATED)
Bilirubin, UA: NEGATIVE
Blood, UA: NEGATIVE
Glucose, UA: NEGATIVE
Ketones, UA: POSITIVE
Leukocytes, UA: NEGATIVE
Nitrite, UA: NEGATIVE
Protein, UA: POSITIVE — AB
Spec Grav, UA: >=1.03 — AB (ref 1.010–1.025)
Urobilinogen, UA: 0.2 E.U./dL
pH, UA: 5 (ref 5.0–8.0)

## 2019-11-20 MED ORDER — CIPROFLOXACIN HCL 500 MG PO TABS: 500.0000 mg | ORAL_TABLET | Freq: Two times a day (BID) | ORAL | 0 refills | Status: DC

## 2019-11-20 MED ORDER — PHENAZOPYRIDINE HCL 200 MG PO TABS: 200.0000 mg | ORAL_TABLET | Freq: Three times a day (TID) | ORAL | 0 refills | Status: DC | PRN

## 2019-11-20 NOTE — Progress Notes (Signed)
Subjective:    Patient ID: Tiffany Mckenzie, female    DOB: 1956-02-07, 63 y.o.   MRN: [id number]  HPI  Pt in with urinary symptoms since Friday night.  Pt in today reporting urinary symptoms.  Dysuria- yes Frequent urination-yes Hesitancy- yes. Suprapubic pressure-yes Fever-no chills-no Nausea-no Vomiting-no CVA pain-no History of UTI-yes. Gross hematuria-no.  Concentrated appearance to urine. Maybe odor.  In past pt had cipro antibiotic for uti. No rxn. Past summer culture did grow out klebsiella.   Review of Systems  Constitutional: Negative for chills and fatigue.  Respiratory: Negative for cough, chest tightness, shortness of breath and wheezing.   Cardiovascular: Negative for chest pain and palpitations.  Gastrointestinal: Negative for abdominal pain.  Genitourinary: Positive for dysuria, frequency and urgency. Negative for flank pain and vaginal pain.  Musculoskeletal: Negative for back pain, myalgias and neck stiffness.  Skin: Negative for rash.  Neurological: Negative for dizziness, syncope, weakness and headaches.  Hematological: Negative for adenopathy. Does not bruise/bleed easily.  Psychiatric/Behavioral: Negative for behavioral problems and confusion.    Past Medical History:  Diagnosis Date  . Allergic rhinitis   . Allergic state 03/03/2015  . Annual physical exam 02/09/2012  . Anxiety state 09/30/2016  . Arthritis   . Asthma   . Constipation 03/03/2015  . Diabetes mellitus type 2 in obese ([facility]) 09/13/2012  . History of chicken pox   . History of hepatitis    age 12  . Hyperglycemia 09/13/2012  . Hypertension   . Hypothyroidism   . Left carotid bruit 03/11/2016  . Obesity 03/28/2017  . Overactive bladder 03/17/2013   Seeing urology  . Raynaud phenomenon 03/03/2015  . Type II or unspecified type diabetes mellitus without mention of complication, uncontrolled 09/13/2012     Social History   Socioeconomic History  . Marital status: Married    Spouse  name: Not on file  . Number of children: Not on file  . Years of education: Not on file  . Highest education level: Not on file  Occupational History  . Not on file  Social Needs  . Financial resource strain: Not on file  . Food insecurity    Worry: Not on file    Inability: Not on file  . Transportation needs    Medical: Not on file    Non-medical: Not on file  Tobacco Use  . Smoking status: Never Smoker  . Smokeless tobacco: Never Used  Substance and Sexual Activity  . Alcohol use: Yes    Comment: less than once a month, white wine or mixed drink  . Drug use: No  . Sexual activity: Not on file  Lifestyle  . Physical activity    Days per week: Not on file    Minutes per session: Not on file  . Stress: Not on file  Relationships  . Social Musician on phone: Not on file    Gets together: Not on file    Attends religious service: Not on file    Active member of club or organization: Not on file    Attends meetings of clubs or organizations: Not on file    Relationship status: Not on file  . Intimate partner violence    Fear of current or ex partner: Not on file    Emotionally abused: Not on file    Physically abused: Not on file    Forced sexual activity: Not on file  Other Topics Concern  . Not on file  Social History Narrative  . Not on file    Past Surgical History:  Procedure Laterality Date  . ABDOMINAL HYSTERECTOMY  2002  . TONSILLECTOMY      Family History  Problem Relation Age of Onset  . Alcohol abuse Father   . Cancer Father        lung  . Diabetes Father   . Arthritis Mother   . Heart disease Mother        Blockage & double bypass  . Hypertension Mother   . Diabetes Mother   . Colon cancer Paternal Grandmother   . Stroke Paternal Grandmother        maternal grandfather  . Breast cancer Maternal Grandmother   . Heart disease Maternal Grandfather   . Scoliosis Daughter   . Cancer Paternal Grandfather        lung    Allergies   Allergen Reactions  . Codeine Nausea And Vomiting    vomiting  . Clarithromycin Rash    Current Outpatient Medications on File Prior to Visit  Medication Sig Dispense Refill  . levothyroxine (SYNTHROID) 100 MCG tablet TAKE 1 TABLET (100 MCG TOTAL) BY MOUTH DAILY BEFORE BREAKFAST. 90 tablet 1  . loratadine (CLARITIN) 10 MG tablet Take 10 mg by mouth daily.    Marland Kitchen LORazepam (ATIVAN) 0.5 MG tablet Take 1 tablet (0.5 mg total) by mouth 2 (two) times daily as needed. 60 tablet 2  . metFORMIN (GLUCOPHAGE) 500 MG tablet TAKE 2 TABLETS IN THE MORNING, 1 TABLET AT NOON AND 2 TABLETS IN THE EVENING 450 tablet 1  . olmesartan (BENICAR) 20 MG tablet Take 0.5 tablets (10 mg total) by mouth daily. 45 tablet 2   No current facility-administered medications on file prior to visit.     BP 111/64 (BP Location: Right Arm, Cuff Size: Large)   Pulse 89   Temp (!) 97 F (36.1 C) (Temporal)   Resp 12   Ht 5\' 2"  (1.575 m)   Wt 174 lb 9.6 oz (79.2 kg)   SpO2 99%   BMI 31.93 kg/m       Objective:   Physical Exam  General Mental Status- Alert. General Appearance- Not in acute distress.   Skin General: Color- Normal Color. Moisture- Normal Moisture.  Neck Carotid Arteries- Normal color. Moisture- Normal Moisture. No carotid bruits. No JVD.  Chest and Lung Exam Auscultation: Breath Sounds:-Normal.  Cardiovascular Auscultation:Rythm- Regular. Murmurs & Other Heart Sounds:Auscultation of the heart reveals- No Murmurs.  Abdomen Inspection:-Inspeection Normal. Palpation/Percussion:Note:No mass. Palpation and Percussion of the abdomen reveal- mild suprapubic Tenderness, Non Distended + BS, no rebound or guarding.  Back- no cva tenderness.   Neurologic Cranial Nerve exam:- CN III-XII intact(No nystagmus), symmetric smile. Strength:- 5/5 equal and symmetric strength both upper and lower extremities.      Assessment & Plan:  You appear to have a urinary tract infection. I am prescribing  cipro antibiotic for the probable infection. Hydrate well. I am sending out a urine culture. During the interim if your signs and symptoms worsen rather than improving please notify us. We will notify your when the culture results are back.  Follow up in 7 days or as needed.  25 minutes spent with pt. 50% of time spent couneling pt on plan going forward and answering pt questions.  Mackie Pai, PA-C

## 2019-11-20 NOTE — Patient Instructions (Addendum)
You appear to have a urinary tract infection. I am prescribing  cipro antibiotic for the probable infection. Hydrate well. I am sending out a urine culture. During the interim if your signs and symptoms worsen rather than improving please notify us. We will notify your when the culture results are back.  Follow up in 7 days or as needed. 

## 2019-11-22 ENCOUNTER — Telehealth: Payer: Self-pay | Admitting: Medical

## 2019-11-22 LAB — URINE CULTURE
MICRO NUMBER:: 1170452
SPECIMEN QUALITY:: ADEQUATE

## 2019-11-22 MED ORDER — CIPROFLOXACIN HCL 500 MG PO TABS: 500.0000 mg | ORAL_TABLET | Freq: Two times a day (BID) | ORAL | 0 refills | Status: DC

## 2019-11-22 NOTE — Telephone Encounter (Signed)
Rx cipro additional 3 days sent to pt pharmacy.

## 2019-11-27 ENCOUNTER — Encounter: Payer: Self-pay | Admitting: Family Medicine

## 2019-11-28 ENCOUNTER — Other Ambulatory Visit: Payer: Self-pay | Admitting: Family Medicine

## 2019-12-07 ENCOUNTER — Encounter: Payer: Self-pay | Admitting: Family Medicine

## 2019-12-07 ENCOUNTER — Ambulatory Visit (INDEPENDENT_AMBULATORY_CARE_PROVIDER_SITE_OTHER): Payer: BC Managed Care – PPO | Admitting: Family Medicine

## 2019-12-07 ENCOUNTER — Other Ambulatory Visit: Payer: Self-pay

## 2019-12-07 DIAGNOSIS — T7840XD Allergy, unspecified, subsequent encounter: Secondary | ICD-10-CM

## 2019-12-07 DIAGNOSIS — E538 Deficiency of other specified B group vitamins: Secondary | ICD-10-CM

## 2019-12-07 DIAGNOSIS — J45909 Unspecified asthma, uncomplicated: Secondary | ICD-10-CM

## 2019-12-07 DIAGNOSIS — E669 Obesity, unspecified: Secondary | ICD-10-CM

## 2019-12-07 DIAGNOSIS — E1169 Type 2 diabetes mellitus with other specified complication: Secondary | ICD-10-CM | POA: Diagnosis not present

## 2019-12-07 DIAGNOSIS — E039 Hypothyroidism, unspecified: Secondary | ICD-10-CM

## 2019-12-07 DIAGNOSIS — E785 Hyperlipidemia, unspecified: Secondary | ICD-10-CM

## 2019-12-07 NOTE — Assessment & Plan Note (Signed)
Uses Claritin daily

## 2019-12-07 NOTE — Assessment & Plan Note (Addendum)
hgba1c acceptable, minimize simple carbs. Increase exercise as tolerated. Continue Metformin but increase to bid

## 2019-12-07 NOTE — Assessment & Plan Note (Signed)
On Levothyroxine, continue to monitor 

## 2019-12-07 NOTE — Assessment & Plan Note (Signed)
No recent flares 

## 2019-12-07 NOTE — Assessment & Plan Note (Signed)
Encouraged heart healthy diet, increase exercise, avoid trans fats, consider a krill oil cap daily 

## 2019-12-07 NOTE — Assessment & Plan Note (Signed)
>>  ASSESSMENT AND PLAN FOR TYPE 2 DIABETES MELLITUS WITH OBESITY WRITTEN ON 12/11/2019  9:16 PM BY BLYTH, STACEY A, MD  hgba1c acceptable, minimize simple carbs. Increase exercise as tolerated. Continue Metformin  but increase to bid

## 2019-12-07 NOTE — Assessment & Plan Note (Signed)
Supplement and monitor 

## 2019-12-11 ENCOUNTER — Ambulatory Visit: Payer: BC Managed Care – PPO | Admitting: Family Medicine

## 2019-12-11 NOTE — Progress Notes (Signed)
Virtual Visit via Video Note  I connected with Tiffany Mckenzie on 12/07/19 at  8:40 AM EST by a video enabled telemedicine application and verified that I am speaking with the correct person using two identifiers.  Location: Patient: home Provider: home   I discussed the limitations of evaluation and management by telemedicine and the availability of in person appointments. The patient expressed understanding and agreed to proceed. Crissie Sickles, CMA was able to get the patient set up on a visit, video   Subjective:    Patient ID: Tiffany Mckenzie, female    DOB: 11/30/1956, 63 y.o.   MRN: [id number]  No chief complaint on file.   HPI Patient is in today for follow up on chronic medical concerns including hyperlipidemia, allergies and more. She continues to follow with specialty care as well. Denies CP/palp/SOB/HA/congestion/fevers/GI or GU c/o. Taking meds as prescribed  Past Medical History:  Diagnosis Date  . Allergic rhinitis   . Allergic state 03/03/2015  . Annual physical exam 02/09/2012  . Anxiety state 09/30/2016  . Arthritis   . Asthma   . Constipation 03/03/2015  . Diabetes mellitus type 2 in obese ([facility]) 09/13/2012  . History of chicken pox   . History of hepatitis    age 63  . Hyperglycemia 09/13/2012  . Hypertension   . Hypothyroidism   . Left carotid bruit 03/11/2016  . Obesity 03/28/2017  . Overactive bladder 03/17/2013   Seeing urology  . Raynaud phenomenon 03/03/2015  . Type II or unspecified type diabetes mellitus without mention of complication, uncontrolled 09/13/2012    Past Surgical History:  Procedure Laterality Date  . ABDOMINAL HYSTERECTOMY  2002  . TONSILLECTOMY      Family History  Problem Relation Age of Onset  . Alcohol abuse Father   . Cancer Father        lung  . Diabetes Father   . Arthritis Mother   . Heart disease Mother        Blockage & double bypass  . Hypertension Mother   . Diabetes Mother   . Colon cancer Paternal Grandmother   .  Stroke Paternal Grandmother        maternal grandfather  . Breast cancer Maternal Grandmother   . Heart disease Maternal Grandfather   . Scoliosis Daughter   . Cancer Paternal Grandfather        lung    Social History   Socioeconomic History  . Marital status: Married    Spouse name: Not on file  . Number of children: Not on file  . Years of education: Not on file  . Highest education level: Not on file  Occupational History  . Not on file  Tobacco Use  . Smoking status: Never Smoker  . Smokeless tobacco: Never Used  Substance and Sexual Activity  . Alcohol use: Yes    Comment: less than once a month, white wine or mixed drink  . Drug use: No  . Sexual activity: Not on file  Other Topics Concern  . Not on file  Social History Narrative  . Not on file   Social Determinants of Health   Financial Resource Strain:   . Difficulty of Paying Living Expenses: Not on file  Food Insecurity:   . Worried About Programme researcher, broadcasting/film/video in the Last Year: Not on file  . Ran Out of Food in the Last Year: Not on file  Transportation Needs:   . Lack of Transportation (Medical): Not on file  .  Lack of Transportation (Non-Medical): Not on file  Physical Activity:   . Days of Exercise per Week: Not on file  . Minutes of Exercise per Session: Not on file  Stress:   . Feeling of Stress : Not on file  Social Connections:   . Frequency of Communication with Friends and Family: Not on file  . Frequency of Social Gatherings with Friends and Family: Not on file  . Attends Religious Services: Not on file  . Active Member of Clubs or Organizations: Not on file  . Attends BankerClub or Organization Meetings: Not on file  . Marital Status: Not on file  Intimate Partner Violence:   . Fear of Current or Ex-Partner: Not on file  . Emotionally Abused: Not on file  . Physically Abused: Not on file  . Sexually Abused: Not on file    Outpatient Medications Prior to Visit  Medication Sig Dispense Refill    . levothyroxine (SYNTHROID) 100 MCG tablet TAKE 1 TABLET (100 MCG TOTAL) BY MOUTH DAILY BEFORE BREAKFAST. 90 tablet 1  . loratadine (CLARITIN) 10 MG tablet Take 10 mg by mouth daily.    Marland Kitchen. LORazepam (ATIVAN) 0.5 MG tablet Take 1 tablet (0.5 mg total) by mouth 2 (two) times daily as needed. 60 tablet 2  . metFORMIN (GLUCOPHAGE) 500 MG tablet TAKE 2 TABLETS IN THE MORNING, 1 TABLET AT NOON AND 2 TABLETS IN THE EVENING 450 tablet 0  . olmesartan (BENICAR) 20 MG tablet Take 0.5 tablets (10 mg total) by mouth daily. 45 tablet 2  . ciprofloxacin (CIPRO) 500 MG tablet Take 1 tablet (500 mg total) by mouth 2 (two) times daily. 6 tablet 0  . phenazopyridine (PYRIDIUM) 200 MG tablet Take 1 tablet (200 mg total) by mouth 3 (three) times daily as needed for pain. 10 tablet 0   No facility-administered medications prior to visit.    Allergies  Allergen Reactions  . Codeine Nausea And Vomiting    vomiting  . Clarithromycin Rash    Review of Systems  Constitutional: Negative for fever and malaise/fatigue.  HENT: Negative for congestion.   Eyes: Negative for blurred vision.  Respiratory: Negative for shortness of breath.   Cardiovascular: Negative for chest pain, palpitations and leg swelling.  Gastrointestinal: Negative for abdominal pain, blood in stool and nausea.  Genitourinary: Negative for dysuria and frequency.  Musculoskeletal: Negative for falls.  Skin: Negative for rash.  Neurological: Negative for dizziness, loss of consciousness and headaches.  Endo/Heme/Allergies: Negative for environmental allergies.  Psychiatric/Behavioral: Negative for depression. The patient is nervous/anxious.        Objective:    Physical Exam Constitutional:      Appearance: Normal appearance. She is not ill-appearing.  HENT:     Head: Normocephalic and atraumatic.  Eyes:     General:        Left eye: No discharge.     Extraocular Movements: Extraocular movements intact.  Neurological:     General:  No focal deficit present.     Mental Status: She is alert.  Psychiatric:        Mood and Affect: Mood normal.     BP 109/72 (BP Location: Left Arm, Patient Position: Sitting, Cuff Size: Normal)   Wt 174 lb (78.9 kg)   BMI 31.83 kg/m  Wt Readings from Last 3 Encounters:  12/07/19 174 lb (78.9 kg)  11/20/19 174 lb 9.6 oz (79.2 kg)  06/06/19 172 lb (78 kg)    Diabetic Foot Exam - Simple  No data filed     Lab Results  Component Value Date   WBC 5.8 06/12/2019   HGB 12.9 06/12/2019   HCT 38.2 06/12/2019   PLT 227.0 06/12/2019   GLUCOSE 179 (H) 06/12/2019   CHOL 141 06/12/2019   TRIG 68.0 06/12/2019   HDL 42.10 06/12/2019   LDLCALC 85 06/12/2019   ALT 20 06/12/2019   AST 13 06/12/2019   NA 140 06/12/2019   K 4.2 06/12/2019   CL 105 06/12/2019   CREATININE 0.72 06/12/2019   BUN 15 06/12/2019   CO2 25 06/12/2019   TSH 0.51 06/12/2019   HGBA1C 7.5 (H) 06/12/2019   MICROALBUR <0.7 06/12/2019    Lab Results  Component Value Date   TSH 0.51 06/12/2019   Lab Results  Component Value Date   WBC 5.8 06/12/2019   HGB 12.9 06/12/2019   HCT 38.2 06/12/2019   MCV 89.3 06/12/2019   PLT 227.0 06/12/2019   Lab Results  Component Value Date   NA 140 06/12/2019   K 4.2 06/12/2019   CO2 25 06/12/2019   GLUCOSE 179 (H) 06/12/2019   BUN 15 06/12/2019   CREATININE 0.72 06/12/2019   BILITOT 0.6 06/12/2019   ALKPHOS 106 06/12/2019   AST 13 06/12/2019   ALT 20 06/12/2019   PROT 5.9 (L) 06/12/2019   ALBUMIN 3.9 06/12/2019   CALCIUM 8.8 06/12/2019   GFR 81.25 06/12/2019   Lab Results  Component Value Date   CHOL 141 06/12/2019   Lab Results  Component Value Date   HDL 42.10 06/12/2019   Lab Results  Component Value Date   LDLCALC 85 06/12/2019   Lab Results  Component Value Date   TRIG 68.0 06/12/2019   Lab Results  Component Value Date   CHOLHDL 3 06/12/2019   Lab Results  Component Value Date   HGBA1C 7.5 (H) 06/12/2019       Assessment & Plan:     Problem List Items Addressed This Visit    Diabetes mellitus type 2 in obese ([place]) (Chronic)    hgba1c acceptable, minimize simple carbs. Increase exercise as tolerated. Continue Metformin but increase to bid      Asthma    No recent flares      Hypothyroidism    On Levothyroxine, continue to monitor      B12 deficiency    Supplement and monitor      Allergy    Uses Claritin daily      Hyperlipidemia associated with type 2 diabetes mellitus ([place])    Encouraged heart healthy diet, increase exercise, avoid trans fats, consider a krill oil cap daily         I have discontinued Tiffany Mckenzie's phenazopyridine and ciprofloxacin. I am also having her maintain her loratadine, olmesartan, LORazepam, levothyroxine, and metFORMIN.  No orders of the defined types were placed in this encounter.    I discussed the assessment and treatment plan with the patient. The patient was provided an opportunity to ask questions and all were answered. The patient agreed with the plan and demonstrated an understanding of the instructions.   The patient was advised to call back or seek an in-person evaluation if the symptoms worsen or if the condition fails to improve as anticipated.  I provided 25 minutes of non-face-to-face time during this encounter.   Penni Homans, MD

## 2020-02-26 ENCOUNTER — Other Ambulatory Visit: Payer: Self-pay | Admitting: Family Medicine

## 2020-03-18 ENCOUNTER — Other Ambulatory Visit: Payer: Self-pay | Admitting: Family Medicine

## 2020-03-18 ENCOUNTER — Other Ambulatory Visit: Payer: Self-pay

## 2020-03-19 ENCOUNTER — Other Ambulatory Visit: Payer: Self-pay

## 2020-03-19 ENCOUNTER — Ambulatory Visit (INDEPENDENT_AMBULATORY_CARE_PROVIDER_SITE_OTHER): Payer: Commercial Managed Care - PPO | Admitting: Family Medicine

## 2020-03-19 DIAGNOSIS — E039 Hypothyroidism, unspecified: Secondary | ICD-10-CM | POA: Diagnosis not present

## 2020-03-19 DIAGNOSIS — E538 Deficiency of other specified B group vitamins: Secondary | ICD-10-CM | POA: Diagnosis not present

## 2020-03-19 DIAGNOSIS — E669 Obesity, unspecified: Secondary | ICD-10-CM | POA: Diagnosis not present

## 2020-03-19 DIAGNOSIS — E1169 Type 2 diabetes mellitus with other specified complication: Secondary | ICD-10-CM | POA: Diagnosis not present

## 2020-03-19 DIAGNOSIS — E785 Hyperlipidemia, unspecified: Secondary | ICD-10-CM

## 2020-03-19 DIAGNOSIS — M545 Low back pain: Secondary | ICD-10-CM

## 2020-03-19 DIAGNOSIS — T7840XD Allergy, unspecified, subsequent encounter: Secondary | ICD-10-CM | POA: Diagnosis not present

## 2020-03-19 DIAGNOSIS — G8929 Other chronic pain: Secondary | ICD-10-CM

## 2020-03-19 LAB — LIPID PANEL
Cholesterol: 158 mg/dL (ref 0–200)
HDL: 44.2 mg/dL (ref >39.00)
LDL Cholesterol: 98 mg/dL (ref 0–99)
NonHDL: 113.33
Total CHOL/HDL Ratio: 4
Triglycerides: 77 mg/dL (ref 0.0–149.0)
VLDL: 15.4 mg/dL (ref 0.0–40.0)

## 2020-03-19 LAB — CBC
HCT: 39.5 % (ref 36.0–46.0)
Hemoglobin: 13.4 g/dL (ref 12.0–15.0)
MCHC: 34 g/dL (ref 30.0–36.0)
MCV: 88.9 fl (ref 78.0–100.0)
Platelets: 224 10*3/uL (ref 150.0–400.0)
RBC: 4.44 Mil/uL (ref 3.87–5.11)
RDW: 12.8 % (ref 11.5–15.5)
WBC: 6.9 10*3/uL (ref 4.0–10.5)

## 2020-03-19 LAB — COMPREHENSIVE METABOLIC PANEL
ALT: 16 U/L (ref 0–35)
AST: 11 U/L (ref 0–37)
Albumin: 4.1 g/dL (ref 3.5–5.2)
Alkaline Phosphatase: 115 U/L (ref 39–117)
BUN: 16 mg/dL (ref 6–23)
CO2: 32 mEq/L (ref 19–32)
Calcium: 9.2 mg/dL (ref 8.4–10.5)
Chloride: 102 mEq/L (ref 96–112)
Creatinine, Ser: 0.71 mg/dL (ref 0.40–1.20)
GFR: 82.37 mL/min (ref >60.00)
Glucose, Bld: 179 mg/dL — ABNORMAL HIGH (ref 70–99)
Potassium: 4.2 mEq/L (ref 3.5–5.1)
Sodium: 139 mEq/L (ref 135–145)
Total Bilirubin: 0.6 mg/dL (ref 0.2–1.2)
Total Protein: 6.2 g/dL (ref 6.0–8.3)

## 2020-03-19 LAB — HEMOGLOBIN A1C: Hgb A1c MFr Bld: 7.1 % — ABNORMAL HIGH (ref 4.6–6.5)

## 2020-03-19 LAB — VITAMIN B12: Vitamin B-12: 177 pg/mL — ABNORMAL LOW (ref 211–911)

## 2020-03-19 LAB — TSH: TSH: 0.25 u[IU]/mL — ABNORMAL LOW (ref 0.35–4.50)

## 2020-03-19 NOTE — Assessment & Plan Note (Signed)
hgba1c acceptable, minimize simple carbs. Increase exercise as tolerated. Continue current meds 

## 2020-03-19 NOTE — Assessment & Plan Note (Signed)
Supplement and monitor 

## 2020-03-19 NOTE — Progress Notes (Signed)
Subjective:    Patient ID: Tiffany Mckenzie, female    DOB: [birth year], 64 y.o.   MRN: [id number]  Chief Complaint  Patient presents with  . Diabetes  . Hypertension  . Anxiety    HPI Patient is in today for chronic medical concerns. She feels well today. No polyuria of polydipsia. No recent asthma exacerbation. No recent asthma exacerbation. She has been struggling with back pain but is trying to stay active. Has been maintaining quarantine. She has been trying to eat well and complete her ADLs. Denies CP/palp/SOB/HA/congestion/fevers/GI or GU c/o. Taking meds as prescribed  Past Medical History:  Diagnosis Date  . Allergic rhinitis   . Allergic state 03/03/2015  . Annual physical exam 02/09/2012  . Anxiety state 09/30/2016  . Arthritis   . Asthma   . Constipation 03/03/2015  . Diabetes mellitus type 2 in obese (HCC) 09/13/2012  . History of chicken pox   . History of hepatitis    age 48  . Hyperglycemia 09/13/2012  . Hypertension   . Hypothyroidism   . Left carotid bruit 03/11/2016  . Obesity 03/28/2017  . Overactive bladder 03/17/2013   Seeing urology  . Raynaud phenomenon 03/03/2015  . Type II or unspecified type diabetes mellitus without mention of complication, uncontrolled 09/13/2012    Past Surgical History:  Procedure Laterality Date  . ABDOMINAL HYSTERECTOMY  2002  . TONSILLECTOMY      Family History  Problem Relation Age of Onset  . Alcohol abuse Father   . Cancer Father        lung  . Diabetes Father   . Arthritis Mother   . Heart disease Mother        Blockage & double bypass  . Hypertension Mother   . Diabetes Mother   . Colon cancer Paternal Grandmother   . Stroke Paternal Grandmother        maternal grandfather  . Breast cancer Maternal Grandmother   . Heart disease Maternal Grandfather   . Scoliosis Daughter   . Cancer Paternal Grandfather        lung    Social History   Socioeconomic History  . Marital status: Married    Spouse name: Not on  file  . Number of children: Not on file  . Years of education: Not on file  . Highest education level: Not on file  Occupational History  . Not on file  Tobacco Use  . Smoking status: Never Smoker  . Smokeless tobacco: Never Used  Substance and Sexual Activity  . Alcohol use: Yes    Comment: less than once a month, white wine or mixed drink  . Drug use: No  . Sexual activity: Not on file  Other Topics Concern  . Not on file  Social History Narrative  . Not on file   Social Determinants of Health   Financial Resource Strain:   . Difficulty of Paying Living Expenses:   Food Insecurity:   . Worried About Programme researcher, broadcasting/film/video in the Last Year:   . Barista in the Last Year:   Transportation Needs:   . Freight forwarder (Medical):   Marland Kitchen Lack of Transportation (Non-Medical):   Physical Activity:   . Days of Exercise per Week:   . Minutes of Exercise per Session:   Stress:   . Feeling of Stress :   Social Connections:   . Frequency of Communication with Friends and Family:   . Frequency of Social Gatherings  with Friends and Family:   . Attends Religious Services:   . Active Member of Clubs or Organizations:   . Attends Archivist Meetings:   Marland Kitchen Marital Status:   Intimate Partner Violence:   . Fear of Current or Ex-Partner:   . Emotionally Abused:   Marland Kitchen Physically Abused:   . Sexually Abused:     Outpatient Medications Prior to Visit  Medication Sig Dispense Refill  . levothyroxine (SYNTHROID) 100 MCG tablet TAKE 1 TABLET (100 MCG TOTAL) BY MOUTH DAILY BEFORE BREAKFAST. 90 tablet 1  . loratadine (CLARITIN) 10 MG tablet Take 10 mg by mouth daily.    Marland Kitchen LORazepam (ATIVAN) 0.5 MG tablet Take 1 tablet (0.5 mg total) by mouth 2 (two) times daily as needed. 60 tablet 2  . metFORMIN (GLUCOPHAGE) 500 MG tablet TAKE 2 TABLETS IN THE MORNING, 1 TABLET AT NOON AND 2 TABLETS IN THE EVENING 450 tablet 0  . olmesartan (BENICAR) 20 MG tablet TAKE 1/2 TABLET BY MOUTH  DAILY 45 tablet 2   No facility-administered medications prior to visit.    Allergies  Allergen Reactions  . Codeine Nausea And Vomiting    vomiting  . Clarithromycin Rash    Review of Systems  Constitutional: Negative for fever and malaise/fatigue.  HENT: Negative for congestion.   Eyes: Negative for blurred vision.  Respiratory: Negative for shortness of breath.   Cardiovascular: Negative for chest pain, palpitations and leg swelling.  Gastrointestinal: Negative for abdominal pain, blood in stool and nausea.  Genitourinary: Negative for dysuria and frequency.  Musculoskeletal: Positive for back pain. Negative for falls.  Skin: Negative for rash.  Neurological: Negative for dizziness, loss of consciousness and headaches.  Endo/Heme/Allergies: Negative for environmental allergies.  Psychiatric/Behavioral: Negative for depression. The patient is not nervous/anxious.        Objective:    Physical Exam Vitals and nursing note reviewed.  Constitutional:      General: She is not in acute distress.    Appearance: She is well-developed.  HENT:     Head: Normocephalic and atraumatic.     Nose: Nose normal.  Eyes:     General:        Right eye: No discharge.        Left eye: No discharge.  Cardiovascular:     Rate and Rhythm: Normal rate and regular rhythm.     Heart sounds: No murmur.  Pulmonary:     Effort: Pulmonary effort is normal.     Breath sounds: Normal breath sounds.  Abdominal:     General: Bowel sounds are normal.     Palpations: Abdomen is soft.     Tenderness: There is no abdominal tenderness.  Musculoskeletal:     Cervical back: Normal range of motion and neck supple.  Skin:    General: Skin is warm and dry.  Neurological:     Mental Status: She is alert and oriented to person, place, and time.     BP 104/63 (BP Location: Right Arm, Cuff Size: Normal)   Pulse 75   Temp 97.8 F (36.6 C) (Temporal)   Resp 12   Ht 5\' 2"  (1.575 m)   Wt 173 lb 9.6  oz (78.7 kg)   SpO2 100%   BMI 31.75 kg/m  Wt Readings from Last 3 Encounters:  03/19/20 173 lb 9.6 oz (78.7 kg)  12/07/19 174 lb (78.9 kg)  11/20/19 174 lb 9.6 oz (79.2 kg)    Diabetic Foot Exam - Simple  No data filed     Lab Results  Component Value Date   WBC 5.8 06/12/2019   HGB 12.9 06/12/2019   HCT 38.2 06/12/2019   PLT 227.0 06/12/2019   GLUCOSE 179 (H) 06/12/2019   CHOL 141 06/12/2019   TRIG 68.0 06/12/2019   HDL 42.10 06/12/2019   LDLCALC 85 06/12/2019   ALT 20 06/12/2019   AST 13 06/12/2019   NA 140 06/12/2019   K 4.2 06/12/2019   CL 105 06/12/2019   CREATININE 0.72 06/12/2019   BUN 15 06/12/2019   CO2 25 06/12/2019   TSH 0.51 06/12/2019   HGBA1C 7.5 (H) 06/12/2019   MICROALBUR <0.7 06/12/2019    Lab Results  Component Value Date   TSH 0.51 06/12/2019   Lab Results  Component Value Date   WBC 5.8 06/12/2019   HGB 12.9 06/12/2019   HCT 38.2 06/12/2019   MCV 89.3 06/12/2019   PLT 227.0 06/12/2019   Lab Results  Component Value Date   NA 140 06/12/2019   K 4.2 06/12/2019   CO2 25 06/12/2019   GLUCOSE 179 (H) 06/12/2019   BUN 15 06/12/2019   CREATININE 0.72 06/12/2019   BILITOT 0.6 06/12/2019   ALKPHOS 106 06/12/2019   AST 13 06/12/2019   ALT 20 06/12/2019   PROT 5.9 (L) 06/12/2019   ALBUMIN 3.9 06/12/2019   CALCIUM 8.8 06/12/2019   GFR 81.25 06/12/2019   Lab Results  Component Value Date   CHOL 141 06/12/2019   Lab Results  Component Value Date   HDL 42.10 06/12/2019   Lab Results  Component Value Date   LDLCALC 85 06/12/2019   Lab Results  Component Value Date   TRIG 68.0 06/12/2019   Lab Results  Component Value Date   CHOLHDL 3 06/12/2019   Lab Results  Component Value Date   HGBA1C 7.5 (H) 06/12/2019       Assessment & Plan:   Problem List Items Addressed This Visit    Diabetes mellitus type 2 in obese (HCC) (Chronic)    hgba1c acceptable, minimize simple carbs. Increase exercise as tolerated. Continue  current meds      Relevant Orders   Hemoglobin A1c   Comprehensive metabolic panel   Chronic back pain    Encouraged moist heat and gentle stretching as tolerated. May try NSAIDs and prescription meds as directed and report if symptoms worsen or seek immediate care      Hypothyroidism    On Levothyroxine, continue to monitor      Relevant Orders   TSH   B12 deficiency    Supplement and monitor      Relevant Orders   CBC   Vitamin B12   Allergy    Is doing well with staying home and masking      Hyperlipidemia associated with type 2 diabetes mellitus (HCC)    Encouraged heart healthy diet, increase exercise, avoid trans fats, consider a krill oil cap daily      Relevant Orders   Lipid panel      I am having Sherre A. Achenbach maintain her loratadine, LORazepam, levothyroxine, olmesartan, and metFORMIN.  No orders of the defined types were placed in this encounter.    Danise Edge, MD

## 2020-03-19 NOTE — Assessment & Plan Note (Signed)
>>  ASSESSMENT AND PLAN FOR TYPE 2 DIABETES MELLITUS WITH OBESITY WRITTEN ON 03/19/2020 10:08 AM BY BLYTH, STACEY A, MD  hgba1c acceptable, minimize simple carbs. Increase exercise as tolerated. Continue current meds

## 2020-03-19 NOTE — Patient Instructions (Addendum)
[employer]ron Blood Pressure cuff, upper arm, want BP 100-140/60-90 Pulse oximeter, want oxygen in 90s  Weekly vitals  Take Multivitamin with minerals, selenium Vitamin D 1000-2000 IU daily Probiotic with lactobacillus and bifidophilus Asprin EC 81 mg daily Fish oil or krill oil daily Melatonin 2-5 mg at bedtime  [email or web address]  The mRNA technology has been in development for 20 years and we already had the Coronavirus family of viruses (which usually just cause the common cold) genetically mapped already which is why we were able to come up with viable vaccine candidates so quickly in stage 1, then stage 2 scientifically took the correct amount of time what we did to speed it up was just build the manufacturing platform at the same time we were running the experiments so if it worked we could produce faster. And stage 3 has now had many months and millions of people immunized and we are seeing the immunity hold for over 9 months now with sign of it dissipating and no significant numbers of adverse reactions.  During every flu season we see 2 anaphylactic reactions for every million shots given and we initially thought we would see 11 per million with the COVID vaccine but now we see only 2-3 with Moderna and 5 or so with Pfizer so compared to someone is dying every 20 minutes from COVID and more deadly and infectious strains are coming it is definitely best when weighing the risks and benefits to take the shots.  Another pooled analysis of the 5 most utilized vaccines in the world shows that after full immunization so far no one has died from COVID.   Consider Benefiber daily  Encouraged moist heat and gentle stretching as tolerated. May try NSAIDs and prescription meds as directed and report if symptoms worsen or seek immediate care

## 2020-03-19 NOTE — Assessment & Plan Note (Signed)
Encouraged moist heat and gentle stretching as tolerated. May try NSAIDs and prescription meds as directed and report if symptoms worsen or seek immediate care 

## 2020-03-19 NOTE — Assessment & Plan Note (Signed)
Is doing well with staying home and masking

## 2020-03-19 NOTE — Assessment & Plan Note (Signed)
Encouraged heart healthy diet, increase exercise, avoid trans fats, consider a krill oil cap daily 

## 2020-03-19 NOTE — Assessment & Plan Note (Signed)
On Levothyroxine, continue to monitor 

## 2020-03-20 ENCOUNTER — Other Ambulatory Visit: Payer: Self-pay | Admitting: *Deleted

## 2020-03-20 ENCOUNTER — Other Ambulatory Visit: Payer: Commercial Managed Care - PPO

## 2020-03-20 ENCOUNTER — Telehealth: Payer: Self-pay | Admitting: *Deleted

## 2020-03-20 DIAGNOSIS — E538 Deficiency of other specified B group vitamins: Secondary | ICD-10-CM

## 2020-03-20 DIAGNOSIS — E039 Hypothyroidism, unspecified: Secondary | ICD-10-CM

## 2020-03-20 NOTE — Telephone Encounter (Signed)
Notify vitamin B 12 is low. She needs to start on Vitamin B12 shots 1000 mcg IM weekly x 6 weeks then 1 shot every other week x 6 weeks then monthly. Intrinsic factor is pending. If it is positive she will have to stay on shots if it is negative she will be able to take Vitamin B12 1000 mcg sublinqual tabs daily. Thyroid is slightly overtreated but not enough to change meds. Recheck TSH and Vitamin B12 in 3 months everything else looks good.

## 2020-03-20 NOTE — Telephone Encounter (Signed)
Patient states that she thinks that her insurance would not cover her to come in to do the injections.  I asks if she would like to give them to herself and she declined.  She stated that she has not been taking her b12 sublingual tabs at all.  She would like to just try to take those instead of taking the injections.

## 2020-03-20 NOTE — Telephone Encounter (Signed)
I do believe they will pay but if she declines it is very important that she take the vitamin B 12 SL every day and that we recheck the level in 10-12 weeks.

## 2020-03-20 NOTE — Telephone Encounter (Signed)
-----   Message from Kathi Simpers, CMA sent at 03/20/2020  7:41 AM EDT ----- Request faxed to [facility] lab to send specimen to [facility] for intrinsic factor.

## 2020-03-21 NOTE — Telephone Encounter (Signed)
Patient notified and she start taking the B12 SL every day and recheck on 7/7

## 2020-03-22 LAB — INTRINSIC FACTOR ANTIBODIES: Intrinsic Factor: NEGATIVE

## 2020-04-24 ENCOUNTER — Other Ambulatory Visit: Payer: Self-pay | Admitting: Family Medicine

## 2020-06-19 ENCOUNTER — Other Ambulatory Visit: Payer: Self-pay

## 2020-06-19 ENCOUNTER — Other Ambulatory Visit (INDEPENDENT_AMBULATORY_CARE_PROVIDER_SITE_OTHER): Payer: Commercial Managed Care - PPO

## 2020-06-19 DIAGNOSIS — E538 Deficiency of other specified B group vitamins: Secondary | ICD-10-CM

## 2020-06-19 DIAGNOSIS — E039 Hypothyroidism, unspecified: Secondary | ICD-10-CM | POA: Diagnosis not present

## 2020-06-19 LAB — VITAMIN B12: Vitamin B-12: 258 pg/mL (ref 211–911)

## 2020-06-19 LAB — TSH: TSH: 2.01 u[IU]/mL (ref 0.35–4.50)

## 2020-07-08 LAB — HM MAMMOGRAPHY

## 2020-07-09 ENCOUNTER — Encounter: Payer: Self-pay | Admitting: *Deleted

## 2020-09-24 ENCOUNTER — Ambulatory Visit (INDEPENDENT_AMBULATORY_CARE_PROVIDER_SITE_OTHER): Payer: Commercial Managed Care - PPO | Admitting: Family Medicine

## 2020-09-24 ENCOUNTER — Encounter: Payer: Self-pay | Admitting: Family Medicine

## 2020-09-24 ENCOUNTER — Other Ambulatory Visit: Payer: Self-pay

## 2020-09-24 VITALS — BP 110/68 | HR 86 | Temp 98.0°F | Resp 12 | Ht 62.0 in | Wt 173.0 lb

## 2020-09-24 DIAGNOSIS — E785 Hyperlipidemia, unspecified: Secondary | ICD-10-CM

## 2020-09-24 DIAGNOSIS — E039 Hypothyroidism, unspecified: Secondary | ICD-10-CM | POA: Diagnosis not present

## 2020-09-24 DIAGNOSIS — R252 Cramp and spasm: Secondary | ICD-10-CM | POA: Diagnosis not present

## 2020-09-24 DIAGNOSIS — E669 Obesity, unspecified: Secondary | ICD-10-CM

## 2020-09-24 DIAGNOSIS — Z0000 Encounter for general adult medical examination without abnormal findings: Secondary | ICD-10-CM | POA: Diagnosis not present

## 2020-09-24 DIAGNOSIS — E538 Deficiency of other specified B group vitamins: Secondary | ICD-10-CM

## 2020-09-24 DIAGNOSIS — Z780 Asymptomatic menopausal state: Secondary | ICD-10-CM

## 2020-09-24 DIAGNOSIS — E2839 Other primary ovarian failure: Secondary | ICD-10-CM

## 2020-09-24 DIAGNOSIS — E1169 Type 2 diabetes mellitus with other specified complication: Secondary | ICD-10-CM | POA: Diagnosis not present

## 2020-09-24 DIAGNOSIS — Z1211 Encounter for screening for malignant neoplasm of colon: Secondary | ICD-10-CM

## 2020-09-24 NOTE — Patient Instructions (Addendum)
Shingrix is the new shingles shot, 2 shots over th 2 to 6 months at pharmacy PCV 23 is Pneumovax  Preventive Care 65 Years and Older, Female Preventive care refers to lifestyle choices and visits with your health care provider that can promote health and wellness. This includes:  A yearly physical exam. This is also called an annual well check.  Regular dental and eye exams.  Immunizations.  Screening for certain conditions.  Healthy lifestyle choices, such as diet and exercise. What can I expect for my preventive care visit? Physical exam Your health care provider will check:  Height and weight. These may be used to calculate body mass index (BMI), which is a measurement that tells if you are at a healthy weight.  Heart rate and blood pressure.  Your skin for abnormal spots. Counseling Your health care provider may ask you questions about:  Alcohol, tobacco, and drug use.  Emotional well-being.  Home and relationship well-being.  Sexual activity.  Eating habits.  History of falls.  Memory and ability to understand (cognition).  Work and work Statistician.  Pregnancy and menstrual history. What immunizations do I need?  Influenza (flu) vaccine  This is recommended every year. Tetanus, diphtheria, and pertussis (Tdap) vaccine  You may need a Td booster every 10 years. Varicella (chickenpox) vaccine  You may need this vaccine if you have not already been vaccinated. Zoster (shingles) vaccine  You may need this after age 67. Pneumococcal conjugate (PCV13) vaccine  One dose is recommended after age 39. Pneumococcal polysaccharide (PPSV23) vaccine  One dose is recommended after age 65. Measles, mumps, and rubella (MMR) vaccine  You may need at least one dose of MMR if you were born in 1957 or later. You may also need a second dose. Meningococcal conjugate (MenACWY) vaccine  You may need this if you have certain conditions. Hepatitis A vaccine  You  may need this if you have certain conditions or if you travel or work in places where you may be exposed to hepatitis A. Hepatitis B vaccine  You may need this if you have certain conditions or if you travel or work in places where you may be exposed to hepatitis B. Haemophilus influenzae type b (Hib) vaccine  You may need this if you have certain conditions. You may receive vaccines as individual doses or as more than one vaccine together in one shot (combination vaccines). Talk with your health care provider about the risks and benefits of combination vaccines. What tests do I need? Blood tests  Lipid and cholesterol levels. These may be checked every 5 years, or more frequently depending on your overall health.  Hepatitis C test.  Hepatitis B test. Screening  Lung cancer screening. You may have this screening every year starting at age 2 if you have a 30-pack-year history of smoking and currently smoke or have quit within the past 15 years.  Colorectal cancer screening. All adults should have this screening starting at age 71 and continuing until age 62. Your health care provider may recommend screening at age 42 if you are at increased risk. You will have tests every 1-10 years, depending on your results and the type of screening test.  Diabetes screening. This is done by checking your blood sugar (glucose) after you have not eaten for a while (fasting). You may have this done every 1-3 years.  Mammogram. This may be done every 1-2 years. Talk with your health care provider about how often you should have regular mammograms.  BRCA-related cancer screening. This may be done if you have a family history of breast, ovarian, tubal, or peritoneal cancers. Other tests  Sexually transmitted disease (STD) testing.  Bone density scan. This is done to screen for osteoporosis. You may have this done starting at age 69. Follow these instructions at home: Eating and drinking  Eat a diet  that includes fresh fruits and vegetables, whole grains, lean protein, and low-fat dairy products. Limit your intake of foods with high amounts of sugar, saturated fats, and salt.  Take vitamin and mineral supplements as recommended by your health care provider.  Do not drink alcohol if your health care provider tells you not to drink.  If you drink alcohol: ? Limit how much you have to 0-1 drink a day. ? Be aware of how much alcohol is in your drink. In the [place] one drink equals one 12 oz bottle of beer (355 mL), one 5 oz glass of wine (148 mL), or one 1 oz glass of hard liquor (44 mL). Lifestyle  Take daily care of your teeth and gums.  Stay active. Exercise for at least 30 minutes on 5 or more days each week.  Do not use any products that contain nicotine or tobacco, such as cigarettes, e-cigarettes, and chewing tobacco. If you need help quitting, ask your health care provider.  If you are sexually active, practice safe sex. Use a condom or other form of protection in order to prevent STIs (sexually transmitted infections).  Talk with your health care provider about taking a low-dose aspirin or statin. What's next?  Go to your health care provider once a year for a well check visit.  Ask your health care provider how often you should have your eyes and teeth checked.  Stay up to date on all vaccines. This information is not intended to replace advice given to you by your health care provider. Make sure you discuss any questions you have with your health care provider. Document Revised: 11/24/2018 Document Reviewed: 11/24/2018 Elsevier Patient Education  2020 [employer].

## 2020-09-25 ENCOUNTER — Other Ambulatory Visit: Payer: Self-pay

## 2020-09-25 DIAGNOSIS — E538 Deficiency of other specified B group vitamins: Secondary | ICD-10-CM

## 2020-09-25 DIAGNOSIS — E039 Hypothyroidism, unspecified: Secondary | ICD-10-CM

## 2020-09-25 LAB — CBC
HCT: 39.8 % (ref 35.0–45.0)
Hemoglobin: 13.7 g/dL (ref 11.7–15.5)
MCH: 31.1 pg (ref 27.0–33.0)
MCHC: 34.4 g/dL (ref 32.0–36.0)
MCV: 90.2 fL (ref 80.0–100.0)
MPV: 11.3 fL (ref 7.5–12.5)
Platelets: 250 10*3/uL (ref 140–400)
RBC: 4.41 10*6/uL (ref 3.80–5.10)
RDW: 12.1 % (ref 11.0–15.0)
WBC: 6.8 10*3/uL (ref 3.8–10.8)

## 2020-09-25 LAB — LIPID PANEL
Cholesterol: 153 mg/dL (ref <200)
HDL: 48 mg/dL — ABNORMAL LOW (ref >=50)
LDL Cholesterol (Calc): 88 mg/dL (calc)
Non-HDL Cholesterol (Calc): 105 mg/dL (calc) (ref <130)
Total CHOL/HDL Ratio: 3.2 (calc) (ref <5.0)
Triglycerides: 78 mg/dL (ref <150)

## 2020-09-25 LAB — COMPREHENSIVE METABOLIC PANEL
AG Ratio: 1.9 (calc) (ref 1.0–2.5)
ALT: 17 U/L (ref 6–29)
AST: 12 U/L (ref 10–35)
Albumin: 4.2 g/dL (ref 3.6–5.1)
Alkaline phosphatase (APISO): 98 U/L (ref 37–153)
BUN: 18 mg/dL (ref 7–25)
CO2: 29 mmol/L (ref 20–32)
Calcium: 9.2 mg/dL (ref 8.6–10.4)
Chloride: 104 mmol/L (ref 98–110)
Creat: 0.72 mg/dL (ref 0.50–0.99)
Globulin: 2.2 g/dL (calc) (ref 1.9–3.7)
Glucose, Bld: 164 mg/dL — ABNORMAL HIGH (ref 65–99)
Potassium: 4.4 mmol/L (ref 3.5–5.3)
Sodium: 141 mmol/L (ref 135–146)
Total Bilirubin: 0.7 mg/dL (ref 0.2–1.2)
Total Protein: 6.4 g/dL (ref 6.1–8.1)

## 2020-09-25 LAB — HEMOGLOBIN A1C
Hgb A1c MFr Bld: 7.6 % of total Hgb — ABNORMAL HIGH (ref <5.7)
Mean Plasma Glucose: 171 (calc)
eAG (mmol/L): 9.5 (calc)

## 2020-09-25 LAB — VITAMIN B12: Vitamin B-12: 322 pg/mL (ref 200–1100)

## 2020-09-25 LAB — TSH: TSH: 0.55 mIU/L (ref 0.40–4.50)

## 2020-09-25 NOTE — Assessment & Plan Note (Signed)
hgba1c acceptable, minimize simple carbs. Increase exercise as tolerated. Continue current meds 

## 2020-09-25 NOTE — Assessment & Plan Note (Signed)
Patient encouraged to maintain heart healthy diet, regular exercise, adequate sleep. Consider daily probiotics. Take medications as prescribed. Labs ordered and reviewed. Referred to gastroenterology for repeat screening colonoscopy. Dexa scan ordered. MGM up to date completed July 2021

## 2020-09-25 NOTE — Assessment & Plan Note (Signed)
Supplement and monitor 

## 2020-09-25 NOTE — Progress Notes (Signed)
Subjective:    Patient ID: Tiffany Mckenzie, female    DOB: Apr 20, 1956, 64 y.o.   MRN: [id number]  Chief Complaint  Patient presents with  . Annual Exam    fasting    HPI Patient is in today for annual preventative exam and follow up on chronic medical concerns. No recent febrile illness or hospitalizations. Is trying to stay active and she maintains a heart healthy diet. No acute concerns. Denies CP/palp/SOB/HA/congestion/fevers/GI or GU c/o. Taking meds as prescribed  Past Medical History:  Diagnosis Date  . Allergic rhinitis   . Allergic state 03/03/2015  . Annual physical exam 02/09/2012  . Anxiety state 09/30/2016  . Arthritis   . Asthma   . Constipation 03/03/2015  . Diabetes mellitus type 2 in obese (HCC) 09/13/2012  . History of chicken pox   . History of hepatitis    age 79  . Hyperglycemia 09/13/2012  . Hypertension   . Hypothyroidism   . Left carotid bruit 03/11/2016  . Obesity 03/28/2017  . Overactive bladder 03/17/2013   Seeing urology  . Raynaud phenomenon 03/03/2015  . Type II or unspecified type diabetes mellitus without mention of complication, uncontrolled 09/13/2012    Past Surgical History:  Procedure Laterality Date  . ABDOMINAL HYSTERECTOMY  2002  . TONSILLECTOMY      Family History  Problem Relation Age of Onset  . Alcohol abuse Father   . Cancer Father        lung  . Diabetes Father   . Arthritis Mother   . Heart disease Mother        Blockage & double bypass  . Hypertension Mother   . Diabetes Mother   . Colon cancer Paternal Grandmother   . Stroke Paternal Grandmother        maternal grandfather  . Breast cancer Maternal Grandmother   . Heart disease Maternal Grandfather   . Scoliosis Daughter   . Cancer Paternal Grandfather        lung    Social History   Socioeconomic History  . Marital status: Married    Spouse name: Not on file  . Number of children: Not on file  . Years of education: Not on file  . Highest education level: Not on  file  Occupational History  . Not on file  Tobacco Use  . Smoking status: Never Smoker  . Smokeless tobacco: Never Used  Substance and Sexual Activity  . Alcohol use: Yes    Comment: less than once a month, white wine or mixed drink  . Drug use: No  . Sexual activity: Not on file  Other Topics Concern  . Not on file  Social History Narrative  . Not on file   Social Determinants of Health   Financial Resource Strain:   . Difficulty of Paying Living Expenses: Not on file  Food Insecurity:   . Worried About Programme researcher, broadcasting/film/video in the Last Year: Not on file  . Ran Out of Food in the Last Year: Not on file  Transportation Needs:   . Lack of Transportation (Medical): Not on file  . Lack of Transportation (Non-Medical): Not on file  Physical Activity:   . Days of Exercise per Week: Not on file  . Minutes of Exercise per Session: Not on file  Stress:   . Feeling of Stress : Not on file  Social Connections:   . Frequency of Communication with Friends and Family: Not on file  . Frequency of Social  Gatherings with Friends and Family: Not on file  . Attends Religious Services: Not on file  . Active Member of Clubs or Organizations: Not on file  . Attends BankerClub or Organization Meetings: Not on file  . Marital Status: Not on file  Intimate Partner Violence:   . Fear of Current or Ex-Partner: Not on file  . Emotionally Abused: Not on file  . Physically Abused: Not on file  . Sexually Abused: Not on file    Outpatient Medications Prior to Visit  Medication Sig Dispense Refill  . levothyroxine (SYNTHROID) 100 MCG tablet TAKE 1 TABLET (100 MCG TOTAL) BY MOUTH DAILY BEFORE BREAKFAST. 90 tablet 1  . loratadine (CLARITIN) 10 MG tablet Take 10 mg by mouth daily.    Marland Kitchen. LORazepam (ATIVAN) 0.5 MG tablet Take 1 tablet (0.5 mg total) by mouth 2 (two) times daily as needed. 60 tablet 2  . metFORMIN (GLUCOPHAGE) 500 MG tablet TAKE 2 TABLETS IN THE MORNING, 1 TABLET AT NOON AND 2 TABLETS IN THE  EVENING 450 tablet 0  . olmesartan (BENICAR) 20 MG tablet TAKE 1/2 TABLET BY MOUTH DAILY 45 tablet 2   No facility-administered medications prior to visit.    Allergies  Allergen Reactions  . Codeine Nausea And Vomiting    vomiting  . Clarithromycin Rash    Review of Systems  Constitutional: Negative for chills, fever and malaise/fatigue.  HENT: Negative for congestion and hearing loss.   Eyes: Negative for discharge.  Respiratory: Negative for cough, sputum production and shortness of breath.   Cardiovascular: Negative for chest pain, palpitations and leg swelling.  Gastrointestinal: Negative for abdominal pain, blood in stool, constipation, diarrhea, heartburn, nausea and vomiting.  Genitourinary: Negative for dysuria, frequency, hematuria and urgency.  Musculoskeletal: Negative for back pain, falls and myalgias.  Skin: Negative for rash.  Neurological: Negative for dizziness, sensory change, loss of consciousness, weakness and headaches.  Endo/Heme/Allergies: Negative for environmental allergies. Does not bruise/bleed easily.  Psychiatric/Behavioral: Negative for depression and suicidal ideas. The patient is not nervous/anxious and does not have insomnia.        Objective:    Physical Exam Constitutional:      General: She is not in acute distress.    Appearance: She is well-developed.  HENT:     Head: Normocephalic and atraumatic.  Eyes:     Conjunctiva/sclera: Conjunctivae normal.  Neck:     Thyroid: No thyromegaly.  Cardiovascular:     Rate and Rhythm: Normal rate and regular rhythm.     Heart sounds: Normal heart sounds. No murmur heard.   Pulmonary:     Effort: Pulmonary effort is normal. No respiratory distress.     Breath sounds: Normal breath sounds.  Abdominal:     General: Bowel sounds are normal. There is no distension.     Palpations: Abdomen is soft. There is no mass.     Tenderness: There is no abdominal tenderness.  Musculoskeletal:     Cervical  back: Neck supple.  Lymphadenopathy:     Cervical: No cervical adenopathy.  Skin:    General: Skin is warm and dry.  Neurological:     Mental Status: She is alert and oriented to person, place, and time.  Psychiatric:        Behavior: Behavior normal.     BP 110/68 (BP Location: Left Arm, Patient Position: Sitting, Cuff Size: Small)   Pulse 86   Temp 98 F (36.7 C) (Oral)   Resp 12   Ht 5'  2" (1.575 m)   Wt 173 lb (78.5 kg)   SpO2 98%   BMI 31.64 kg/m  Wt Readings from Last 3 Encounters:  09/24/20 173 lb (78.5 kg)  03/19/20 173 lb 9.6 oz (78.7 kg)  12/07/19 174 lb (78.9 kg)    Diabetic Foot Exam - Simple   No data filed     Lab Results  Component Value Date   WBC 6.8 09/24/2020   HGB 13.7 09/24/2020   HCT 39.8 09/24/2020   PLT 250 09/24/2020   GLUCOSE 164 (H) 09/24/2020   CHOL 153 09/24/2020   TRIG 78 09/24/2020   HDL 48 (L) 09/24/2020   LDLCALC 88 09/24/2020   ALT 17 09/24/2020   AST 12 09/24/2020   NA 141 09/24/2020   K 4.4 09/24/2020   CL 104 09/24/2020   CREATININE 0.72 09/24/2020   BUN 18 09/24/2020   CO2 29 09/24/2020   TSH 0.55 09/24/2020   HGBA1C 7.6 (H) 09/24/2020   MICROALBUR <0.7 06/12/2019    Lab Results  Component Value Date   TSH 0.55 09/24/2020   Lab Results  Component Value Date   WBC 6.8 09/24/2020   HGB 13.7 09/24/2020   HCT 39.8 09/24/2020   MCV 90.2 09/24/2020   PLT 250 09/24/2020   Lab Results  Component Value Date   NA 141 09/24/2020   K 4.4 09/24/2020   CO2 29 09/24/2020   GLUCOSE 164 (H) 09/24/2020   BUN 18 09/24/2020   CREATININE 0.72 09/24/2020   BILITOT 0.7 09/24/2020   ALKPHOS 115 03/19/2020   AST 12 09/24/2020   ALT 17 09/24/2020   PROT 6.4 09/24/2020   ALBUMIN 4.1 03/19/2020   CALCIUM 9.2 09/24/2020   GFR 82.37 03/19/2020   Lab Results  Component Value Date   CHOL 153 09/24/2020   Lab Results  Component Value Date   HDL 48 (L) 09/24/2020   Lab Results  Component Value Date   LDLCALC 88  09/24/2020   Lab Results  Component Value Date   TRIG 78 09/24/2020   Lab Results  Component Value Date   CHOLHDL 3.2 09/24/2020   Lab Results  Component Value Date   HGBA1C 7.6 (H) 09/24/2020       Assessment & Plan:   Problem List Items Addressed This Visit    Diabetes mellitus type 2 in obese (HCC) (Chronic)    hgba1c acceptable, minimize simple carbs. Increase exercise as tolerated. Continue current meds      Relevant Orders   Hemoglobin A1c (Completed)   Comprehensive metabolic panel (Completed)   Annual physical exam    Patient encouraged to maintain heart healthy diet, regular exercise, adequate sleep. Consider daily probiotics. Take medications as prescribed. Labs ordered and reviewed. Referred to gastroenterology for repeat screening colonoscopy. Dexa scan ordered. MGM up to date completed July 2021      Hypothyroidism - Primary    On Levothyroxine, continue to monitor      Relevant Orders   TSH (Completed)   B12 deficiency    Supplement and monitor      Relevant Orders   CBC (Completed)   Vitamin B12 (Completed)   Muscle cramp   Hyperlipidemia associated with type 2 diabetes mellitus (HCC)    encouraged heart healthy diet, avoid trans fats, minimize simple carbs and saturated fats. Increase exercise as tolerated      Relevant Orders   Lipid panel (Completed)    Other Visit Diagnoses    Colon cancer screening  Relevant Orders   Ambulatory referral to Gastroenterology   Post-menopause       Relevant Orders   DG Bone Density   Estrogen deficiency       Relevant Orders   DG Bone Density      I am having Tiffany Mckenzie maintain her loratadine, LORazepam, olmesartan, metFORMIN, and levothyroxine.  No orders of the defined types were placed in this encounter.    Danise Edge, MD

## 2020-09-25 NOTE — Assessment & Plan Note (Signed)
>>  ASSESSMENT AND PLAN FOR TYPE 2 DIABETES MELLITUS WITH OBESITY WRITTEN ON 09/25/2020  6:57 PM BY BLYTH, STACEY A, MD  hgba1c acceptable, minimize simple carbs. Increase exercise as tolerated. Continue current meds

## 2020-09-25 NOTE — Assessment & Plan Note (Signed)
On Levothyroxine, continue to monitor 

## 2020-09-25 NOTE — Assessment & Plan Note (Signed)
encouraged heart healthy diet, avoid trans fats, minimize simple carbs and saturated fats. Increase exercise as tolerated 

## 2020-10-04 ENCOUNTER — Other Ambulatory Visit: Payer: Self-pay

## 2020-10-04 ENCOUNTER — Ambulatory Visit (HOSPITAL_BASED_OUTPATIENT_CLINIC_OR_DEPARTMENT_OTHER)
Admission: RE | Admit: 2020-10-04 | Discharge: 2020-10-04 | Disposition: A | Discharge status: Home / Self Care | Payer: Commercial Managed Care - PPO | Source: Ambulatory Visit | Attending: Family Medicine | Admitting: Family Medicine

## 2020-10-04 DIAGNOSIS — E2839 Other primary ovarian failure: Secondary | ICD-10-CM | POA: Insufficient documentation

## 2020-10-04 DIAGNOSIS — Z780 Asymptomatic menopausal state: Secondary | ICD-10-CM | POA: Insufficient documentation

## 2020-10-07 NOTE — Progress Notes (Signed)
Spoke with pt went over providers recommendations. No other concerns will go get suggested supplements and begin taking med

## 2020-10-08 LAB — HM DIABETES EYE EXAM

## 2020-10-22 ENCOUNTER — Other Ambulatory Visit: Payer: Self-pay | Admitting: Family Medicine

## 2020-10-28 ENCOUNTER — Encounter: Payer: Self-pay | Admitting: Gastroenterology

## 2020-11-16 ENCOUNTER — Other Ambulatory Visit: Payer: Self-pay | Admitting: Family Medicine

## 2020-11-20 ENCOUNTER — Other Ambulatory Visit: Payer: Self-pay | Admitting: Family Medicine

## 2020-11-20 ENCOUNTER — Telehealth: Payer: Self-pay | Admitting: Family Medicine

## 2020-11-20 NOTE — Telephone Encounter (Signed)
Medication:  LORazepam (ATIVAN) 0.5 MG tablet [[id number]]      Has the patient contacted their pharmacy?  (If no, request that the patient contact the pharmacy for the refill.) (If yes, when and what did the pharmacy advise?)     Preferred Pharmacy (with phone number or street name):    [id number] - [place], [place] - [address]  [address], [place] [address]  Phone:  [phone] Fax:  [phone]   Agent: Please be advised that RX refills may take up to 3 business days. We ask that you follow-up with your pharmacy.

## 2020-11-21 ENCOUNTER — Other Ambulatory Visit: Payer: Self-pay | Admitting: Family Medicine

## 2020-11-21 MED ORDER — LORAZEPAM 0.5 MG PO TABS
0.5000 mg | ORAL_TABLET | Freq: Two times a day (BID) | ORAL | 2 refills | Status: DC | PRN
Start: 2020-11-21 — End: 2021-07-08

## 2020-11-21 NOTE — Telephone Encounter (Signed)
done

## 2020-11-21 NOTE — Telephone Encounter (Signed)
Requesting: ativan Contract:n/a UDS:05/2019 Last Visit:09/24/2020 Next Visit:03/25/2021 Last Refill:05/2019  Please Advise

## 2020-12-02 ENCOUNTER — Encounter: Payer: Self-pay | Admitting: Family Medicine

## 2020-12-24 ENCOUNTER — Encounter: Payer: Commercial Managed Care - PPO | Admitting: Gastroenterology

## 2020-12-26 ENCOUNTER — Other Ambulatory Visit: Payer: Commercial Managed Care - PPO

## 2020-12-27 ENCOUNTER — Other Ambulatory Visit: Payer: Self-pay

## 2020-12-27 ENCOUNTER — Encounter: Payer: Self-pay | Admitting: Family Medicine

## 2020-12-27 ENCOUNTER — Ambulatory Visit (INDEPENDENT_AMBULATORY_CARE_PROVIDER_SITE_OTHER): Payer: Commercial Managed Care - PPO | Admitting: Family Medicine

## 2020-12-27 VITALS — BP 110/60 | HR 92 | Temp 98.2°F | Resp 18 | Ht 62.0 in | Wt 172.6 lb

## 2020-12-27 DIAGNOSIS — E1169 Type 2 diabetes mellitus with other specified complication: Secondary | ICD-10-CM

## 2020-12-27 DIAGNOSIS — R350 Frequency of micturition: Secondary | ICD-10-CM | POA: Diagnosis not present

## 2020-12-27 DIAGNOSIS — I10 Essential (primary) hypertension: Secondary | ICD-10-CM | POA: Diagnosis not present

## 2020-12-27 DIAGNOSIS — E785 Hyperlipidemia, unspecified: Secondary | ICD-10-CM

## 2020-12-27 DIAGNOSIS — E669 Obesity, unspecified: Secondary | ICD-10-CM

## 2020-12-27 DIAGNOSIS — E039 Hypothyroidism, unspecified: Secondary | ICD-10-CM | POA: Diagnosis not present

## 2020-12-27 DIAGNOSIS — Z1211 Encounter for screening for malignant neoplasm of colon: Secondary | ICD-10-CM

## 2020-12-27 DIAGNOSIS — E538 Deficiency of other specified B group vitamins: Secondary | ICD-10-CM

## 2020-12-27 DIAGNOSIS — E1165 Type 2 diabetes mellitus with hyperglycemia: Secondary | ICD-10-CM | POA: Diagnosis not present

## 2020-12-27 LAB — LIPID PANEL
Cholesterol: 154 mg/dL (ref 0–200)
HDL: 40.9 mg/dL (ref >39.00)
LDL Cholesterol: 94 mg/dL (ref 0–99)
NonHDL: 113.21
Total CHOL/HDL Ratio: 4
Triglycerides: 95 mg/dL (ref 0.0–149.0)
VLDL: 19 mg/dL (ref 0.0–40.0)

## 2020-12-27 LAB — POC URINALSYSI DIPSTICK (AUTOMATED)
Bilirubin, UA: NEGATIVE
Blood, UA: NEGATIVE
Glucose, UA: NEGATIVE
Ketones, UA: NEGATIVE
Leukocytes, UA: NEGATIVE
Nitrite, UA: NEGATIVE
Protein, UA: POSITIVE — AB
Spec Grav, UA: >=1.03 — AB (ref 1.010–1.025)
Urobilinogen, UA: 1 E.U./dL
pH, UA: 5 (ref 5.0–8.0)

## 2020-12-27 LAB — TSH: TSH: 2.36 u[IU]/mL (ref 0.35–4.50)

## 2020-12-27 LAB — COMPREHENSIVE METABOLIC PANEL
ALT: 13 U/L (ref 0–35)
AST: 11 U/L (ref 0–37)
Albumin: 4.1 g/dL (ref 3.5–5.2)
Alkaline Phosphatase: 104 U/L (ref 39–117)
BUN: 16 mg/dL (ref 6–23)
CO2: 28 mEq/L (ref 19–32)
Calcium: 9.4 mg/dL (ref 8.4–10.5)
Chloride: 101 mEq/L (ref 96–112)
Creatinine, Ser: 0.84 mg/dL (ref 0.40–1.20)
GFR: 72.26 mL/min (ref >60.00)
Glucose, Bld: 223 mg/dL — ABNORMAL HIGH (ref 70–99)
Potassium: 4.4 mEq/L (ref 3.5–5.1)
Sodium: 137 mEq/L (ref 135–145)
Total Bilirubin: 0.5 mg/dL (ref 0.2–1.2)
Total Protein: 6.3 g/dL (ref 6.0–8.3)

## 2020-12-27 LAB — VITAMIN B12: Vitamin B-12: 406 pg/mL (ref 211–911)

## 2020-12-27 LAB — HEMOGLOBIN A1C: Hgb A1c MFr Bld: 7.5 % — ABNORMAL HIGH (ref 4.6–6.5)

## 2020-12-27 MED ORDER — CEPHALEXIN 500 MG PO CAPS: 500.0000 mg | ORAL_CAPSULE | Freq: Two times a day (BID) | ORAL | 0 refills | Status: DC

## 2020-12-27 NOTE — Patient Instructions (Signed)
Urinary Frequency, Adult Urinary frequency means urinating more often than usual. You may urinate every 1-2 hours even though you drink a normal amount of fluid and do not have a bladder infection or condition. Although you urinate more often than normal,the total amount of urine produced in a day is normal. With urinary frequency, you may have an urgent need to urinate often. The stress and anxiety of needing to find a bathroom quickly can make this urge worse. This condition may go away on its own or you may need treatment at home. Home treatment may include bladder training, exercises, taking medicines, ormaking changes to your diet. Follow these instructions at home: Bladder health  Keep a bladder diary if told by your health care provider. Keep track of: What you eat and drink. How often you urinate. How much you urinate. Follow a bladder training program if told by your health care provider. This may include: Learning to delay going to the bathroom. Double urinating (voiding). This helps if you are not completely emptying your bladder. Scheduled voiding. Do Kegel exercises as told by your health care provider. Kegel exercises strengthen the muscles that help control urination, which may help the condition.  Eating and drinking If told by your health care provider, make diet changes, such as: Avoiding caffeine. Drinking fewer fluids, especially alcohol. Not drinking in the evening. Avoiding foods or drinks that may irritate the bladder. These include coffee, tea, soda, artificial sweeteners, citrus, tomato-based foods, and chocolate. Eating foods that help prevent or ease constipation. Constipation can make this condition worse. Your health care provider may recommend that you: Drink enough fluid to keep your urine pale yellow. Take over-the-counter or prescription medicines. Eat foods that are high in fiber, such as beans, whole grains, and fresh fruits and vegetables. Limit foods  that are high in fat and processed sugars, such as fried or sweet foods. General instructions Take over-the-counter and prescription medicines only as told by your health care provider. Keep all follow-up visits as told by your health care provider. This is important. Contact a health care provider if: You start urinating more often. You feel pain or irritation when you urinate. You notice blood in your urine. Your urine looks cloudy. You develop a fever. You begin vomiting. Get help right away if: You are unable to urinate. Summary Urinary frequency means urinating more often than usual. With urinary frequency, you may urinate every 1-2 hours even though you drink a normal amount of fluid and do not have a bladder infection or other bladder condition. Your health care provider may recommend that you keep a bladder diary, follow a bladder training program, or make dietary changes. If told by your health care provider, do Kegel exercises to strengthen the muscles that help control urination. Take over-the-counter and prescription medicines only as told by your health care provider. Contact a health care provider if your symptoms do not improve or get worse. This information is not intended to replace advice given to you by your health care provider. Make sure you discuss any questions you have with your healthcare provider. Document Revised: 06/09/2018 Document Reviewed: 06/09/2018 [employer]vier Patient Education  2021 [employer]  

## 2020-12-27 NOTE — Progress Notes (Signed)
Patient ID: Tiffany Mckenzie, female    DOB: March 19, 1956  Age: 65 y.o. MRN: [id number]    Subjective:  Subjective  HPI Tiffany Mckenzie presents for low back pain and low abd pressure , no dysuria no hematuria no other complaints  She is due for her colonoscopy but would like to go to her GI in [place] for this She would also like to get her labs done now so she does not have to come back  Review of Systems  Constitutional: Negative for appetite change, diaphoresis, fatigue and unexpected weight change.  Eyes: Negative for pain, redness and visual disturbance.  Respiratory: Negative for cough, chest tightness, shortness of breath and wheezing.   Cardiovascular: Negative for chest pain, palpitations and leg swelling.  Endocrine: Negative for cold intolerance, heat intolerance, polydipsia, polyphagia and polyuria.  Genitourinary: Positive for flank pain. Negative for difficulty urinating, dysuria, frequency, vaginal bleeding, vaginal discharge and vaginal pain.  Neurological: Negative for dizziness, light-headedness, numbness and headaches.    History Past Medical History:  Diagnosis Date  . Allergic rhinitis   . Allergic state 03/03/2015  . Annual physical exam 02/09/2012  . Anxiety state 09/30/2016  . Arthritis   . Asthma   . Constipation 03/03/2015  . Diabetes mellitus type 2 in obese (HCC) 09/13/2012  . History of chicken pox   . History of hepatitis    age 35  . Hyperglycemia 09/13/2012  . Hypertension   . Hypothyroidism   . Left carotid bruit 03/11/2016  . Obesity 03/28/2017  . Overactive bladder 03/17/2013   Seeing urology  . Raynaud phenomenon 03/03/2015  . Type II or unspecified type diabetes mellitus without mention of complication, uncontrolled 09/13/2012    She has Mckenzie past surgical history that includes Tonsillectomy and Abdominal hysterectomy (2002).   Her family history includes Alcohol abuse in her father; Arthritis in her mother; Breast cancer in her maternal grandmother; Cancer in  her father and paternal grandfather; Colon cancer in her paternal grandmother; Diabetes in her father and mother; Heart disease in her maternal grandfather and mother; Hypertension in her mother; Scoliosis in her daughter; Stroke in her paternal grandmother.She reports that she has never smoked. She has never used smokeless tobacco. She reports current alcohol use. She reports that she does not use drugs.  Current Outpatient Medications on File Prior to Visit  Medication Sig Dispense Refill  . levothyroxine (SYNTHROID) 100 MCG tablet TAKE 1 TABLET (100 MCG TOTAL) BY MOUTH DAILY BEFORE BREAKFAST. 90 tablet 1  . loratadine (CLARITIN) 10 MG tablet Take 10 mg by mouth daily.    Marland Kitchen LORazepam (ATIVAN) 0.5 MG tablet Take 1 tablet (0.5 mg total) by mouth 2 (two) times daily as needed. 60 tablet 2  . metFORMIN (GLUCOPHAGE) 500 MG tablet TAKE 2 TABLETS IN THE MORNING, 1 TABLET AT NOON AND 2 TABLETS IN THE EVENING 450 tablet 1  . olmesartan (BENICAR) 20 MG tablet Take 0.5 tablets (10 mg total) by mouth daily. 45 tablet 1   No current facility-administered medications on file prior to visit.     Objective:  Objective  Physical Exam Vitals and nursing note reviewed.  Constitutional:      Appearance: She is well-developed and well-nourished.  HENT:     Head: Normocephalic and atraumatic.  Eyes:     Extraocular Movements: EOM normal.     Conjunctiva/sclera: Conjunctivae normal.  Neck:     Thyroid: No thyromegaly.     Vascular: No carotid bruit or JVD.  Cardiovascular:  Rate and Rhythm: Normal rate and regular rhythm.     Heart sounds: Normal heart sounds. No murmur heard.   Pulmonary:     Effort: Pulmonary effort is normal. No respiratory distress.     Breath sounds: Normal breath sounds. No wheezing or rales.  Chest:     Chest wall: No tenderness.  Musculoskeletal:        General: No edema.     Cervical back: Normal range of motion and neck supple.  Neurological:     Mental Status: She  is alert and oriented to person, place, and time.  Psychiatric:        Mood and Affect: Mood and affect normal.    BP 110/60 (BP Location: Right Arm, Patient Position: Sitting, Cuff Size: Large)   Pulse 92   Temp 98.2 F (36.8 C) (Oral)   Resp 18   Ht 5\' 2"  (1.575 m)   Wt 172 lb 9.6 oz (78.3 kg)   SpO2 96%   BMI 31.57 kg/m  Wt Readings from Last 3 Encounters:  12/27/20 172 lb 9.6 oz (78.3 kg)  09/24/20 173 lb (78.5 kg)  03/19/20 173 lb 9.6 oz (78.7 kg)     Lab Results  Component Value Date   WBC 6.8 09/24/2020   HGB 13.7 09/24/2020   HCT 39.8 09/24/2020   PLT 250 09/24/2020   GLUCOSE 164 (H) 09/24/2020   CHOL 153 09/24/2020   TRIG 78 09/24/2020   HDL 48 (L) 09/24/2020   LDLCALC 88 09/24/2020   ALT 17 09/24/2020   AST 12 09/24/2020   NA 141 09/24/2020   K 4.4 09/24/2020   CL 104 09/24/2020   CREATININE 0.72 09/24/2020   BUN 18 09/24/2020   CO2 29 09/24/2020   TSH 0.55 09/24/2020   HGBA1C 7.6 (H) 09/24/2020   MICROALBUR <0.7 06/12/2019    DG Bone Density  Result Date: 10/04/2020 EXAM: DUAL X-RAY ABSORPTIOMETRY (DXA) FOR BONE MINERAL DENSITY IMPRESSION: Tiffany Mckenzie Your patient Tiffany Mckenzie completed Mckenzie BMD test on 10/04/2020 using the Lunar IDXA DXA System (analysis version: 16.SP2) manufactured by 10/06/2020. The following summarizes the results of our evaluation. [facility] PATIENT: Name: Tiffany, Mckenzie Patient ID:  Tiffany Mckenzie Birth Date: 1956/03/29 Height:     62.0 in. Gender:      Female Measured:   10/04/2020 Weight:     172.2 lbs. Indications: Caucasian, Estrogen Deficiency, Hysterectomy, Low Calcium Intake, Post Menopausal Fractures: Treatments: Levothyroxine, Vitamin D ASSESSMENT: The BMD measured at Forearm Radius 33% is 0.782 g/cm2 with Mckenzie T-score of -1.1. This patient is considered OSTEOPENIC ic according to World Health Organization [facility]) criteria. Lumbar spine was not utilized due to advanced degenerative changes. The scan quality is good. Site Region Measured  Date Measured Age WHO YA BMD Classification T-score DualFemur Neck Right 10/04/2020 66.4 years Normal -0.1 1.030 g/cm2 Left Forearm Radius 33% 10/04/2020 66.4 Low Bone Mass -1.1 0.782 g/cm2 World Health Organization [facility]) criteria for post-menopausal, Caucasian Women: Normal        T-score at or above -1 SD Low Bone Mass T-score between -1 and -2.5 SD Osteoporosis  T-score at or below -2.5 SD RECOMMENDATION: 1. All patients should optimize calcium and vitamin D intake. 2. Consider FDA-approved medical therapies in postmenopausal women and men aged 17 years and older, based on the following: Mckenzie. Mckenzie hip or vertebral(clinical or morphometric) fracture. b. T-score < -2.5 at the femoral neck or spine after appropriate evaluation to exclude secondary causes c. Low bone mass (  T-score between -1.0 and -2.5 at the femoral neck or spine) and Mckenzie 10 year probability of Mckenzie hip fracture >3% or Mckenzie 10 year probability of major osteoporosis-related fracture > 20% based on the [place]-adapted WHO algorithm d. Clinical judgement and/or patient preferences may indicate treatment for people with 10-year fracture probabilities above or below these levels FOLLOW-UP: Patients with diagnosis of osteoporosis or at high risk for fracture should have regular bone mineral density tests. For patients eligible for Medicare, routine testing is allowed once every 2 years. The testing frequency can be increased to one year for patients who have rapidly progressing disease, those who are receiving or discontinuing medical therapy to restore bone mass, or have additional risk factors. I have reviewed this report and agree with the above findings. Mark Mckenzie. Tyron RussellBoles, M.D. [facility] Patient: Tiffany FrederickSpaugh, Tiffany Mckenzie: Bradd CanarySTACEY Mckenzie Mckenzie Birth Date: [birth year] [age] Patient ID: [id number] Height: 62.0 in. Weight: 172.2 lbs. Measured: 10/04/2020 2:14:46 PM (16 SP 4) Gender: Female Ethnicity: White Analyzed: 10/04/2020 2:28:28 PM (16 SP 4)  FRAX* 10-year Probability of Fracture Based on femoral neck BMD: DualFemur (Right) Major Osteoporotic Fracture: 6.6% Hip Fracture:                0.2% Population:                  [place] (Caucasian) Risk Factors:                None *FRAX is Mckenzie [employer]support[employer]administrative [employer]ertrad[employer] of the [employer] [facility], Mckenzie World Science writerHealth Organization (WHO) [employer]. ASSESSMENT: The probability of Mckenzie major osteoporotic fracture is 6.6% within the next ten years. The probability of Mckenzie hip fracture is 0.2% within the next ten years. I have reviewed this report and agree with the above findings. Mark Mckenzie. Tyron RussellBoles, M.D. [facility] Electronically Signed   By: Ulyses SouthwardMark  Boles M.D.   On: 10/04/2020 15:36     Assessment & Plan:  Plan  I am having Tiffany Mckenzie start on cephALEXin. I am also having her maintain her loratadine, levothyroxine, olmesartan, metFORMIN, and LORazepam.  Meds ordered this encounter  Medications  . cephALEXin (KEFLEX) 500 MG capsule    Sig: Take 1 capsule (500 mg total) by mouth 2 (two) times daily.    Dispense:  20 capsule    Refill:  0    Problem List Items Addressed This Visit      Unprioritized   B12 deficiency   Relevant Orders   Lipid panel   Hemoglobin A1c   Vitamin B12   Comprehensive metabolic panel   TSH   Colon cancer screening    Referral placed for pt GI in [place]      Relevant Orders   Ambulatory referral to Gastroenterology   Diabetes mellitus type 2 in obese (HCC) (Chronic)    Pt asked to have labs done today because sugars have been high for her  160s       Hyperlipidemia associated with type 2 diabetes mellitus (HCC)    Pt asked to have labs done today      Hypothyroidism    Pt asked to have labs done today so she does not have to come back next week      Relevant Orders   Lipid panel   Hemoglobin A1c   Vitamin B12   Comprehensive metabolic panel   TSH   Urinary frequency -  Primary    abx sent in  Culture pending---  f/u pcp if no better       Relevant Medications   cephALEXin (KEFLEX) 500 MG capsule   Other Relevant Orders   POCT Urinalysis Dipstick (Automated) (Completed)   Urine Culture    Other Visit Diagnoses    Uncontrolled type 2 diabetes mellitus with hyperglycemia (HCC)       Relevant Orders   Lipid panel   Hemoglobin A1c   Vitamin B12   Comprehensive metabolic panel   TSH   Primary hypertension       Relevant Orders   Lipid panel   Hemoglobin A1c   Vitamin B12   Comprehensive metabolic panel   TSH      Follow-up: Return if symptoms worsen or fail to improve.  Donato Schultz, DO

## 2020-12-27 NOTE — Assessment & Plan Note (Signed)
>>  ASSESSMENT AND PLAN FOR TYPE 2 DIABETES MELLITUS WITH OBESITY WRITTEN ON 12/27/2020 12:36 PM BY LOWNE CHASE, YVONNE R, DO  Pt asked to have labs done today because sugars have been high for her  160s

## 2020-12-27 NOTE — Assessment & Plan Note (Signed)
abx sent in  Culture pending--- f/u pcp if no better

## 2020-12-27 NOTE — Assessment & Plan Note (Signed)
Referral placed for pt GI in [place]

## 2020-12-27 NOTE — Assessment & Plan Note (Signed)
Pt asked to have labs done today because sugars have been high for her  160s

## 2020-12-27 NOTE — Assessment & Plan Note (Signed)
Pt asked to have labs done today

## 2020-12-27 NOTE — Assessment & Plan Note (Signed)
Pt asked to have labs done today so she does not have to come back next week

## 2020-12-29 LAB — URINE CULTURE
MICRO NUMBER:: 11419922
SPECIMEN QUALITY:: ADEQUATE

## 2021-01-03 ENCOUNTER — Encounter: Payer: Self-pay | Admitting: Family Medicine

## 2021-01-07 ENCOUNTER — Other Ambulatory Visit: Payer: Commercial Managed Care - PPO

## 2021-01-15 ENCOUNTER — Telehealth (INDEPENDENT_AMBULATORY_CARE_PROVIDER_SITE_OTHER): Payer: Commercial Managed Care - PPO | Admitting: Family Medicine

## 2021-01-15 ENCOUNTER — Other Ambulatory Visit: Payer: Self-pay

## 2021-01-15 ENCOUNTER — Encounter: Payer: Self-pay | Admitting: Family Medicine

## 2021-01-15 ENCOUNTER — Other Ambulatory Visit: Payer: Self-pay | Admitting: Family Medicine

## 2021-01-15 DIAGNOSIS — R059 Cough, unspecified: Secondary | ICD-10-CM | POA: Diagnosis not present

## 2021-01-15 MED ORDER — HYDROCODONE-HOMATROPINE 5-1.5 MG/5ML PO SYRP: 5.0000 mL | ORAL_SOLUTION | Freq: Three times a day (TID) | ORAL | 0 refills | Status: DC | PRN

## 2021-01-15 MED ORDER — HYDROCOD POLST-CPM POLST ER 10-8 MG/5ML PO SUER: 5.0000 mL | Freq: Two times a day (BID) | ORAL | 0 refills | Status: DC | PRN

## 2021-01-15 MED ORDER — HYDROCODONE-HOMATROPINE 5-1.5 MG/5ML PO SYRP
5.0000 mL | ORAL_SOLUTION | Freq: Three times a day (TID) | ORAL | 0 refills | Status: DC | PRN
Start: 2021-01-15 — End: 2021-01-15

## 2021-01-15 MED ORDER — AZITHROMYCIN 250 MG PO TABS: ORAL_TABLET | ORAL | 0 refills | Status: DC

## 2021-01-15 NOTE — Addendum Note (Signed)
Addended by: Radene Gunning on: 01/15/2021 02:14 PM   Modules accepted: Orders

## 2021-01-15 NOTE — Progress Notes (Signed)
Chief Complaint  Patient presents with  . Cough    Tiffany Mckenzie here for URI complaints. Due to COVID-19 pandemic, we are interacting via web portal for an electronic face-to-face visit. I verified patient's ID using 2 identifiers. Patient agreed to proceed with visit via this method. Patient is at home, I am at office. Patient, husband and I are present for visit.   Duration: 2 weeks  Associated symptoms: sinus congestion, itchy watery eyes and coughing Denies: sinus pain, rhinorrhea, ear pain, ear drainage, sore throat, wheezing, shortness of breath, myalgia and fevers, N/V/D, loss of taste/smell Treatment to date: Tylenol, Delsym Sick contacts: Yes  Has not been tested for covid.   Past Medical History:  Diagnosis Date  . Allergic rhinitis   . Allergic state 03/03/2015  . Annual physical exam 02/09/2012  . Anxiety state 09/30/2016  . Arthritis   . Asthma   . Constipation 03/03/2015  . Diabetes mellitus type 2 in obese (HCC) 09/13/2012  . History of chicken pox   . History of hepatitis    age 65  . Hyperglycemia 09/13/2012  . Hypertension   . Hypothyroidism   . Left carotid bruit 03/11/2016  . Obesity 03/28/2017  . Overactive bladder 03/17/2013   Seeing urology  . Raynaud phenomenon 03/03/2015  . Type II or unspecified type diabetes mellitus without mention of complication, uncontrolled 09/13/2012   Exam No conversational dyspnea Age appropriate judgment and insight Nml affect and mood  Cough - Plan: HYDROcodone-homatropine (HYCODAN) 5-1.5 MG/5ML syrup, azithromycin (ZITHROMAX) 250 MG tablet  Probably viral. Get tested for covid. Syrup for symptom management. Zpak for pocket rx in 2-3 d if no better.  Continue to push fluids, practice good hand hygiene, cover mouth when coughing. F/u prn. If starting to experience fevers, shaking, or shortness of breath, seek immediate care. Pt voiced understanding and agreement to the plan.  Jilda Roche [place], DO 01/15/21 2:08 PM

## 2021-01-21 ENCOUNTER — Other Ambulatory Visit: Payer: Self-pay | Admitting: Family Medicine

## 2021-01-21 MED ORDER — FLOVENT HFA 110 MCG/ACT IN AERO: 2.0000 | INHALATION_SPRAY | Freq: Two times a day (BID) | RESPIRATORY_TRACT | 1 refills | Status: DC

## 2021-01-30 ENCOUNTER — Other Ambulatory Visit: Payer: Self-pay | Admitting: Family Medicine

## 2021-03-25 ENCOUNTER — Other Ambulatory Visit: Payer: Self-pay

## 2021-03-25 ENCOUNTER — Telehealth (INDEPENDENT_AMBULATORY_CARE_PROVIDER_SITE_OTHER): Payer: Commercial Managed Care - PPO | Admitting: Family Medicine

## 2021-03-25 DIAGNOSIS — E538 Deficiency of other specified B group vitamins: Secondary | ICD-10-CM

## 2021-03-25 DIAGNOSIS — E1169 Type 2 diabetes mellitus with other specified complication: Secondary | ICD-10-CM

## 2021-03-25 DIAGNOSIS — F411 Generalized anxiety disorder: Secondary | ICD-10-CM

## 2021-03-25 DIAGNOSIS — E785 Hyperlipidemia, unspecified: Secondary | ICD-10-CM

## 2021-03-25 DIAGNOSIS — E669 Obesity, unspecified: Secondary | ICD-10-CM

## 2021-03-25 DIAGNOSIS — E039 Hypothyroidism, unspecified: Secondary | ICD-10-CM | POA: Diagnosis not present

## 2021-03-25 DIAGNOSIS — E119 Type 2 diabetes mellitus without complications: Secondary | ICD-10-CM

## 2021-03-25 NOTE — Assessment & Plan Note (Signed)
On Levothyroxine, continue to monitor 

## 2021-03-25 NOTE — Assessment & Plan Note (Signed)
hgba1c acceptable, minimize simple carbs. Increase exercise as tolerated. Continue current meds 

## 2021-03-25 NOTE — Assessment & Plan Note (Signed)
>>  ASSESSMENT AND PLAN FOR TYPE 2 DIABETES MELLITUS WITH OBESITY WRITTEN ON 03/25/2021  1:01 PM BY BLYTH, STACEY A, MD  hgba1c acceptable, minimize simple carbs. Increase exercise as tolerated. Continue current meds

## 2021-03-25 NOTE — Progress Notes (Signed)
Virtual telephone visit    Virtual Visit via Telephone Note   This visit type was conducted due to national recommendations for restrictions regarding the COVID-19 Pandemic (e.g. social distancing) in an effort to limit this patient's exposure and mitigate transmission in our community. Due to her co-morbid illnesses, this patient is at least at moderate risk for complications without adequate follow up. This format is felt to be most appropriate for this patient at this time. The patient did not have access to video technology or had technical difficulties with video requiring transitioning to audio format only (telephone). Physical exam was limited to content and character of the telephone converstion. Berenda Morale, CMA was able to get the patient set up on a telephone visit.   Patient location: home Patient and provider in visit Provider location: Office  I discussed the limitations of evaluation and management by telemedicine and the availability of in person appointments. The patient expressed understanding and agreed to proceed.   Visit Date: 03/25/2021  Today's healthcare provider: Danise Edge, MD     Subjective:    Patient ID: Tiffany Mckenzie, female    DOB: 12-25-1955, 65 y.o.   MRN: [id number]  Chief Complaint  Patient presents with  . Follow-up    HPI Patient is in today for follow up on chronic medical concerns. She believes she had Covid in February, her husband had the same symptoms and he tested positive. She recovered fully although he still has some persistent symptoms. She notes the fatigue was the most persistent symptom but has resolved now. She denies any other recent febrile illness or hospitalizations. Denies CP/palp/SOB/HA/congestion/fevers/GI or GU c/o. Taking meds as prescribed  Past Medical History:  Diagnosis Date  . Allergic rhinitis   . Allergic state 03/03/2015  . Annual physical exam 02/09/2012  . Anxiety state 09/30/2016  . Arthritis   . Asthma    . Constipation 03/03/2015  . Diabetes mellitus type 2 in obese (HCC) 09/13/2012  . History of chicken pox   . History of hepatitis    age 65  . Hyperglycemia 09/13/2012  . Hypertension   . Hypothyroidism   . Left carotid bruit 03/11/2016  . Obesity 03/28/2017  . Overactive bladder 03/17/2013   Seeing urology  . Raynaud phenomenon 03/03/2015  . Type II or unspecified type diabetes mellitus without mention of complication, uncontrolled 09/13/2012    Past Surgical History:  Procedure Laterality Date  . ABDOMINAL HYSTERECTOMY  2002  . TONSILLECTOMY      Family History  Problem Relation Age of Onset  . Alcohol abuse Father   . Cancer Father        lung  . Diabetes Father   . Arthritis Mother   . Heart disease Mother        Blockage & double bypass  . Hypertension Mother   . Diabetes Mother   . Colon cancer Paternal Grandmother   . Stroke Paternal Grandmother        maternal grandfather  . Breast cancer Maternal Grandmother   . Heart disease Maternal Grandfather   . Scoliosis Daughter   . Cancer Paternal Grandfather        lung    Social History   Socioeconomic History  . Marital status: Married    Spouse name: Not on file  . Number of children: Not on file  . Years of education: Not on file  . Highest education level: Not on file  Occupational History  . Not on file  Tobacco Use  . Smoking status: Never Smoker  . Smokeless tobacco: Never Used  Substance and Sexual Activity  . Alcohol use: Yes    Comment: less than once a month, white wine or mixed drink  . Drug use: No  . Sexual activity: Not on file  Other Topics Concern  . Not on file  Social History Narrative  . Not on file   Social Determinants of Health   Financial Resource Strain: Not on file  Food Insecurity: Not on file  Transportation Needs: Not on file  Physical Activity: Not on file  Stress: Not on file  Social Connections: Not on file  Intimate Partner Violence: Not on file    Outpatient  Medications Prior to Visit  Medication Sig Dispense Refill  . fluticasone (FLOVENT HFA) 110 MCG/ACT inhaler Inhale 2 puffs into the lungs in the morning and at bedtime. Rinse mouth out after use. 1 each 1  . levothyroxine (SYNTHROID) 100 MCG tablet Take 1 tablet (100 mcg total) by mouth daily before breakfast. 90 tablet 1  . loratadine (CLARITIN) 10 MG tablet Take 10 mg by mouth daily.    Marland Kitchen LORazepam (ATIVAN) 0.5 MG tablet Take 1 tablet (0.5 mg total) by mouth 2 (two) times daily as needed. 60 tablet 2  . metFORMIN (GLUCOPHAGE) 500 MG tablet TAKE 2 TABLETS IN THE MORNING, 1 TABLET AT NOON AND 2 TABLETS IN THE EVENING 450 tablet 1  . olmesartan (BENICAR) 20 MG tablet Take 0.5 tablets (10 mg total) by mouth daily. 45 tablet 1  . chlorpheniramine-HYDROcodone (TUSSIONEX) 10-8 MG/5ML SUER Take 5 mLs by mouth every 12 (twelve) hours as needed for cough. 140 mL 0   No facility-administered medications prior to visit.    Allergies  Allergen Reactions  . Codeine Nausea And Vomiting    vomiting  . Clarithromycin Rash    Review of Systems  Constitutional: Negative for fever and malaise/fatigue.  HENT: Negative for congestion.   Eyes: Negative for blurred vision.  Respiratory: Negative for shortness of breath.   Cardiovascular: Negative for chest pain, palpitations and leg swelling.  Gastrointestinal: Negative for abdominal pain, blood in stool and nausea.  Genitourinary: Negative for dysuria and frequency.  Musculoskeletal: Negative for falls.  Skin: Negative for rash.  Neurological: Negative for dizziness, loss of consciousness and headaches.  Endo/Heme/Allergies: Negative for environmental allergies.  Psychiatric/Behavioral: Negative for depression. The patient is not nervous/anxious.        Objective:    Physical Exam unable to otain via phone  Pulse 99   Wt 168 lb (76.2 kg)   SpO2 96%   BMI 30.73 kg/m  Wt Readings from Last 3 Encounters:  03/25/21 168 lb (76.2 kg)  12/27/20  172 lb 9.6 oz (78.3 kg)  09/24/20 173 lb (78.5 kg)    Diabetic Foot Exam - Simple   No data filed    Lab Results  Component Value Date   WBC 6.8 09/24/2020   HGB 13.7 09/24/2020   HCT 39.8 09/24/2020   PLT 250 09/24/2020   GLUCOSE 223 (H) 12/27/2020   CHOL 154 12/27/2020   TRIG 95.0 12/27/2020   HDL 40.90 12/27/2020   LDLCALC 94 12/27/2020   ALT 13 12/27/2020   AST 11 12/27/2020   NA 137 12/27/2020   K 4.4 12/27/2020   CL 101 12/27/2020   CREATININE 0.84 12/27/2020   BUN 16 12/27/2020   CO2 28 12/27/2020   TSH 2.36 12/27/2020   HGBA1C 7.5 (H) 12/27/2020  MICROALBUR <0.7 06/12/2019    Lab Results  Component Value Date   TSH 2.36 12/27/2020   Lab Results  Component Value Date   WBC 6.8 09/24/2020   HGB 13.7 09/24/2020   HCT 39.8 09/24/2020   MCV 90.2 09/24/2020   PLT 250 09/24/2020   Lab Results  Component Value Date   NA 137 12/27/2020   K 4.4 12/27/2020   CO2 28 12/27/2020   GLUCOSE 223 (H) 12/27/2020   BUN 16 12/27/2020   CREATININE 0.84 12/27/2020   BILITOT 0.5 12/27/2020   ALKPHOS 104 12/27/2020   AST 11 12/27/2020   ALT 13 12/27/2020   PROT 6.3 12/27/2020   ALBUMIN 4.1 12/27/2020   CALCIUM 9.4 12/27/2020   GFR 72.26 12/27/2020   Lab Results  Component Value Date   CHOL 154 12/27/2020   Lab Results  Component Value Date   HDL 40.90 12/27/2020   Lab Results  Component Value Date   LDLCALC 94 12/27/2020   Lab Results  Component Value Date   TRIG 95.0 12/27/2020   Lab Results  Component Value Date   CHOLHDL 4 12/27/2020   Lab Results  Component Value Date   HGBA1C 7.5 (H) 12/27/2020       Assessment & Plan:   Problem List Items Addressed This Visit    Diabetes mellitus type 2 in obese (HCC) (Chronic)    hgba1c acceptable, minimize simple carbs. Increase exercise as tolerated. Continue current meds      Hypothyroidism    On Levothyroxine, continue to monitor      B12 deficiency    .Supplement and monitor       Anxiety state    Is doing well using Lorazepam prn and does not need refills today      Hyperlipidemia associated with type 2 diabetes mellitus (HCC)    On Levothyroxine, continue to monitor         I have discontinued Genae A. Escudero's chlorpheniramine-HYDROcodone. I am also having her maintain her loratadine, olmesartan, metFORMIN, LORazepam, Flovent HFA, and levothyroxine.  No orders of the defined types were placed in this encounter.    I discussed the assessment and treatment plan with the patient. The patient was provided an opportunity to ask questions and all were answered. The patient agreed with the plan and demonstrated an understanding of the instructions.   The patient was advised to call back or seek an in-person evaluation if the symptoms worsen or if the condition fails to improve as anticipated.  I provided 28 minutes of non-face-to-face time during this encounter.   Danise Edge, MD [facility] [phone] (phone) [phone] (fax)  [facility]

## 2021-03-25 NOTE — Assessment & Plan Note (Signed)
Is doing well using Lorazepam prn and does not need refills today

## 2021-03-25 NOTE — Assessment & Plan Note (Signed)
Supplement and monitor 

## 2021-03-26 ENCOUNTER — Encounter: Payer: Self-pay | Admitting: *Deleted

## 2021-03-26 LAB — HM COLONOSCOPY

## 2021-05-13 ENCOUNTER — Other Ambulatory Visit: Payer: Self-pay | Admitting: Family Medicine

## 2021-05-19 ENCOUNTER — Other Ambulatory Visit: Payer: Self-pay | Admitting: Family Medicine

## 2021-06-26 ENCOUNTER — Other Ambulatory Visit: Payer: Self-pay

## 2021-06-26 ENCOUNTER — Telehealth (INDEPENDENT_AMBULATORY_CARE_PROVIDER_SITE_OTHER): Payer: Commercial Managed Care - PPO | Admitting: Family Medicine

## 2021-06-26 VITALS — BP 123/73 | HR 97

## 2021-06-26 DIAGNOSIS — E039 Hypothyroidism, unspecified: Secondary | ICD-10-CM | POA: Diagnosis not present

## 2021-06-26 DIAGNOSIS — E538 Deficiency of other specified B group vitamins: Secondary | ICD-10-CM

## 2021-06-26 DIAGNOSIS — E785 Hyperlipidemia, unspecified: Secondary | ICD-10-CM

## 2021-06-26 DIAGNOSIS — I10 Essential (primary) hypertension: Secondary | ICD-10-CM | POA: Diagnosis not present

## 2021-06-26 DIAGNOSIS — U071 COVID-19: Secondary | ICD-10-CM

## 2021-06-26 DIAGNOSIS — E1169 Type 2 diabetes mellitus with other specified complication: Secondary | ICD-10-CM

## 2021-06-26 DIAGNOSIS — R300 Dysuria: Secondary | ICD-10-CM

## 2021-06-26 DIAGNOSIS — E669 Obesity, unspecified: Secondary | ICD-10-CM

## 2021-06-28 DIAGNOSIS — R300 Dysuria: Secondary | ICD-10-CM | POA: Insufficient documentation

## 2021-06-28 DIAGNOSIS — I10 Essential (primary) hypertension: Secondary | ICD-10-CM | POA: Insufficient documentation

## 2021-06-28 DIAGNOSIS — U071 COVID-19: Secondary | ICD-10-CM | POA: Insufficient documentation

## 2021-06-28 NOTE — Progress Notes (Signed)
MyChart Video Visit    Virtual Visit via Video Note   This visit type was conducted due to national recommendations for restrictions regarding the COVID-19 Pandemic (e.g. social distancing) in an effort to limit this patient's exposure and mitigate transmission in our community. This patient is at least at moderate risk for complications without adequate follow up. This format is felt to be most appropriate for this patient at this time. Physical exam was limited by quality of the video and audio technology used for the visit. S Chism, CMA was able to get the patient set up on a video visit.  Patient location: home Patient and provider in visit Provider location: Office  I discussed the limitations of evaluation and management by telemedicine and the availability of in person appointments. The patient expressed understanding and agreed to proceed.  Visit Date: 06/26/2021  Today's healthcare provider: Danise Edge, MD     Subjective:    Patient ID: Tiffany Mckenzie, female    DOB: 09/04/1956, 65 y.o.   MRN: [id number]  No chief complaint on file.   HPI Patient is in today for follow up on chronic medical concerns. No recent febrile illness or hospitalizations. She struggled through COVID last week but is feeling much better. Still notes some congestion and sinus pressure. Denies CP/palp/SOB/HA/fevers/GI or GU c/o. Taking meds as prescribed   Past Medical History:  Diagnosis Date   Allergic rhinitis    Allergic state 03/03/2015   Annual physical exam 02/09/2012   Anxiety state 09/30/2016   Arthritis    Asthma    Constipation 03/03/2015   Diabetes mellitus type 2 in obese (HCC) 09/13/2012   History of chicken pox    History of hepatitis    age 53   Hyperglycemia 09/13/2012   Hypertension    Hypothyroidism    Left carotid bruit 03/11/2016   Obesity 03/28/2017   Overactive bladder 03/17/2013   Seeing urology   Raynaud phenomenon 03/03/2015   Type II or unspecified type diabetes  mellitus without mention of complication, uncontrolled 09/13/2012    Past Surgical History:  Procedure Laterality Date   ABDOMINAL HYSTERECTOMY  2002   TONSILLECTOMY      Family History  Problem Relation Age of Onset   Alcohol abuse Father    Cancer Father        lung   Diabetes Father    Arthritis Mother    Heart disease Mother        Blockage & double bypass   Hypertension Mother    Diabetes Mother    Colon cancer Paternal Grandmother    Stroke Paternal Grandmother        maternal grandfather   Breast cancer Maternal Grandmother    Heart disease Maternal Grandfather    Scoliosis Daughter    Cancer Paternal Grandfather        lung    Social History   Socioeconomic History   Marital status: Married    Spouse name: Not on file   Number of children: Not on file   Years of education: Not on file   Highest education level: Not on file  Occupational History   Not on file  Tobacco Use   Smoking status: Never   Smokeless tobacco: Never  Substance and Sexual Activity   Alcohol use: Yes    Comment: less than once a month, white wine or mixed drink   Drug use: No   Sexual activity: Not on file  Other Topics Concern  Not on file  Social History Narrative   Not on file   Social Determinants of Health   Financial Resource Strain: Not on file  Food Insecurity: Not on file  Transportation Needs: Not on file  Physical Activity: Not on file  Stress: Not on file  Social Connections: Not on file  Intimate Partner Violence: Not on file    Outpatient Medications Prior to Visit  Medication Sig Dispense Refill   fluticasone (FLOVENT HFA) 110 MCG/ACT inhaler Inhale 2 puffs into the lungs in the morning and at bedtime. Rinse mouth out after use. 1 each 1   levothyroxine (SYNTHROID) 100 MCG tablet Take 1 tablet (100 mcg total) by mouth daily before breakfast. 90 tablet 1   loratadine (CLARITIN) 10 MG tablet Take 10 mg by mouth daily.     LORazepam (ATIVAN) 0.5 MG tablet  Take 1 tablet (0.5 mg total) by mouth 2 (two) times daily as needed. 60 tablet 2   metFORMIN (GLUCOPHAGE) 500 MG tablet TAKE 2 TABLETS IN THE MORNING, 1 TABLET AT NOON AND 2 TABLETS IN THE EVENING 450 tablet 1   olmesartan (BENICAR) 20 MG tablet TAKE 1/2 TABLET BY MOUTH DAILY 45 tablet 1   No facility-administered medications prior to visit.    Allergies  Allergen Reactions   Codeine Nausea And Vomiting    vomiting   Clarithromycin Rash    Review of Systems  Constitutional:  Negative for fever and malaise/fatigue.  HENT:  Positive for congestion and sinus pain. Negative for sore throat.   Eyes:  Negative for blurred vision.  Respiratory:  Negative for shortness of breath.   Cardiovascular:  Negative for chest pain, palpitations and leg swelling.  Gastrointestinal:  Negative for abdominal pain, blood in stool and nausea.  Genitourinary:  Negative for dysuria and frequency.  Musculoskeletal:  Negative for falls.  Skin:  Negative for rash.  Neurological:  Negative for dizziness, loss of consciousness and headaches.  Endo/Heme/Allergies:  Negative for environmental allergies.  Psychiatric/Behavioral:  Negative for depression. The patient is not nervous/anxious.       Objective:    Physical Exam Constitutional:      General: She is not in acute distress.    Appearance: Normal appearance. She is not ill-appearing or toxic-appearing.  HENT:     Head: Normocephalic and atraumatic.     Right Ear: External ear normal.     Left Ear: External ear normal.     Nose: Nose normal.  Eyes:     General:        Right eye: No discharge.        Left eye: No discharge.  Pulmonary:     Effort: Pulmonary effort is normal.  Skin:    Findings: No rash.  Neurological:     Mental Status: She is alert and oriented to person, place, and time.  Psychiatric:        Behavior: Behavior normal.    BP 123/73   Pulse 97   SpO2 97%  Wt Readings from Last 3 Encounters:  03/25/21 168 lb (76.2 kg)   12/27/20 172 lb 9.6 oz (78.3 kg)  09/24/20 173 lb (78.5 kg)    Diabetic Foot Exam - Simple   No data filed    Lab Results  Component Value Date   WBC 6.8 09/24/2020   HGB 13.7 09/24/2020   HCT 39.8 09/24/2020   PLT 250 09/24/2020   GLUCOSE 223 (H) 12/27/2020   CHOL 154 12/27/2020   TRIG 95.0 12/27/2020  HDL 40.90 12/27/2020   LDLCALC 94 12/27/2020   ALT 13 12/27/2020   AST 11 12/27/2020   NA 137 12/27/2020   K 4.4 12/27/2020   CL 101 12/27/2020   CREATININE 0.84 12/27/2020   BUN 16 12/27/2020   CO2 28 12/27/2020   TSH 2.36 12/27/2020   HGBA1C 7.5 (H) 12/27/2020   MICROALBUR <0.7 06/12/2019    Lab Results  Component Value Date   TSH 2.36 12/27/2020   Lab Results  Component Value Date   WBC 6.8 09/24/2020   HGB 13.7 09/24/2020   HCT 39.8 09/24/2020   MCV 90.2 09/24/2020   PLT 250 09/24/2020   Lab Results  Component Value Date   NA 137 12/27/2020   K 4.4 12/27/2020   CO2 28 12/27/2020   GLUCOSE 223 (H) 12/27/2020   BUN 16 12/27/2020   CREATININE 0.84 12/27/2020   BILITOT 0.5 12/27/2020   ALKPHOS 104 12/27/2020   AST 11 12/27/2020   ALT 13 12/27/2020   PROT 6.3 12/27/2020   ALBUMIN 4.1 12/27/2020   CALCIUM 9.4 12/27/2020   GFR 72.26 12/27/2020   Lab Results  Component Value Date   CHOL 154 12/27/2020   Lab Results  Component Value Date   HDL 40.90 12/27/2020   Lab Results  Component Value Date   LDLCALC 94 12/27/2020   Lab Results  Component Value Date   TRIG 95.0 12/27/2020   Lab Results  Component Value Date   CHOLHDL 4 12/27/2020   Lab Results  Component Value Date   HGBA1C 7.5 (H) 12/27/2020       Assessment & Plan:   Problem List Items Addressed This Visit     Diabetes mellitus type 2 in obese (HCC) (Chronic)    hgba1c acceptable, minimize simple carbs. Increase exercise as tolerated. Continue current meds       Relevant Orders   Hemoglobin A1c   Vitamin B12   Hypothyroidism    On Levothyroxine, continue to  monitor       Relevant Orders   TSH   Vitamin B12   B12 deficiency - Primary    Supplement and monitor       Relevant Orders   Vitamin B12   Hyperlipidemia associated with type 2 diabetes mellitus (HCC)    Encourage heart healthy diet such as MIND or DASH diet, increase exercise, avoid trans fats, simple carbohydrates and processed foods, consider a krill or fish or flaxseed oil cap daily.        Relevant Orders   Hemoglobin A1c   CBC with Differential/Platelet   Comprehensive metabolic panel   Lipid panel   TSH   Vitamin B12   Dysuria    Hydrate and check UA with culture       Relevant Orders   Urinalysis   Urine Culture   Primary hypertension   Relevant Orders   Hemoglobin A1c   CBC with Differential/Platelet   Comprehensive metabolic panel   Lipid panel   TSH   Vitamin B12   COVID-19    Is feelging better after having COVID last week. She has some mild congestion still but is feeling much better. Did not seek treatment. Encouraged to monitor O2 and symptoms and seek care if worsens        I am having Tiffany Mckenzie maintain her loratadine, LORazepam, Flovent HFA, levothyroxine, olmesartan, and metFORMIN.  No orders of the defined types were placed in this encounter.   I discussed the assessment and treatment  plan with the patient. The patient was provided an opportunity to ask questions and all were answered. The patient agreed with the plan and demonstrated an understanding of the instructions.   The patient was advised to call back or seek an in-person evaluation if the symptoms worsen or if the condition fails to improve as anticipated.  I provided 22 minutes of face-to-face time during this encounter.   Danise Edge, MD [facility] [phone] (phone) [phone] (fax)  [facility]

## 2021-06-28 NOTE — Assessment & Plan Note (Signed)
hgba1c acceptable, minimize simple carbs. Increase exercise as tolerated. Continue current meds 

## 2021-06-28 NOTE — Assessment & Plan Note (Signed)
Is feelging better after having COVID last week. She has some mild congestion still but is feeling much better. Did not seek treatment. Encouraged to monitor O2 and symptoms and seek care if worsens

## 2021-06-28 NOTE — Assessment & Plan Note (Signed)
>>  ASSESSMENT AND PLAN FOR TYPE 2 DIABETES MELLITUS WITH OBESITY WRITTEN ON 06/28/2021  7:15 PM BY BLYTH, STACEY A, MD  hgba1c acceptable, minimize simple carbs. Increase exercise as tolerated. Continue current meds

## 2021-06-28 NOTE — Assessment & Plan Note (Signed)
Hydrate and check UA with culture

## 2021-06-28 NOTE — Assessment & Plan Note (Signed)
On Levothyroxine, continue to monitor 

## 2021-06-28 NOTE — Assessment & Plan Note (Signed)
Encourage heart healthy diet such as MIND or DASH diet, increase exercise, avoid trans fats, simple carbohydrates and processed foods, consider a krill or fish or flaxseed oil cap daily.  °

## 2021-06-28 NOTE — Assessment & Plan Note (Signed)
Supplement and monitor 

## 2021-07-08 ENCOUNTER — Other Ambulatory Visit: Payer: Self-pay | Admitting: Family Medicine

## 2021-07-08 NOTE — Telephone Encounter (Signed)
Requesting: Lorazepam Contract:  UDS: 06/14/19 Last Visit: 06/26/21 Next Visit: none Last Refill: 11/21/20  Please Advise

## 2021-07-14 LAB — HM MAMMOGRAPHY

## 2021-07-17 ENCOUNTER — Encounter: Payer: Self-pay | Admitting: *Deleted

## 2021-10-01 ENCOUNTER — Telehealth: Payer: Commercial Managed Care - PPO | Admitting: Physician Assistant

## 2021-10-01 DIAGNOSIS — R300 Dysuria: Secondary | ICD-10-CM

## 2021-10-01 MED ORDER — CEPHALEXIN 500 MG PO CAPS: 500.0000 mg | ORAL_CAPSULE | Freq: Two times a day (BID) | ORAL | 0 refills | Status: AC

## 2021-10-01 NOTE — Progress Notes (Signed)
I have spent 5 minutes in review of e-visit questionnaire, review and updating patient chart, medical decision making and response to patient.   Marieli Rudy Cody Griffyn Kucinski, PA-C    

## 2021-10-01 NOTE — Progress Notes (Signed)

## 2021-11-06 ENCOUNTER — Other Ambulatory Visit: Payer: Self-pay | Admitting: Family Medicine

## 2021-11-24 ENCOUNTER — Other Ambulatory Visit: Payer: Self-pay | Admitting: Family Medicine

## 2022-02-06 ENCOUNTER — Other Ambulatory Visit: Payer: Self-pay | Admitting: Family Medicine

## 2022-02-23 ENCOUNTER — Other Ambulatory Visit: Payer: Self-pay | Admitting: Family Medicine

## 2022-03-20 ENCOUNTER — Other Ambulatory Visit: Payer: Self-pay | Admitting: Family Medicine

## 2022-04-15 NOTE — Progress Notes (Signed)
? ?Subjective:  ? ? Patient ID: Tiffany Mckenzie, female    DOB: 1956/01/05, 66 y.o.   MRN: [id number] ? ?Chief Complaint  ?Patient presents with  ? Allergies  ? Follow-up  ? ? ?HPI ?Patient is in today for a follow up. She denies any recent febrile illness or acute hospitalizations. She is struggling with some allergies and a cough is notable at night. No fevers or chills. She notes some trouble with leg cramps as well at times. She is trying to maintain a heart healthy diet and to hydrate well but it is difficult some days. Denies CP/palp/SOB/HA/fevers/GI or GU c/o. Taking meds as prescribed  ? ?Past Medical History:  ?Diagnosis Date  ? Allergic rhinitis   ? Allergic state 03/03/2015  ? Annual physical exam 02/09/2012  ? Anxiety state 09/30/2016  ? Arthritis   ? Asthma   ? Constipation 03/03/2015  ? Diabetes mellitus type 2 in obese ([place]) 09/13/2012  ? History of chicken pox   ? History of hepatitis   ? age 2  ? Hyperglycemia 09/13/2012  ? Hypertension   ? Hypothyroidism   ? Left carotid bruit 03/11/2016  ? Obesity 03/28/2017  ? Overactive bladder 03/17/2013  ? Seeing urology  ? Raynaud phenomenon 03/03/2015  ? Type II or unspecified type diabetes mellitus without mention of complication, uncontrolled 09/13/2012  ? ? ?Past Surgical History:  ?Procedure Laterality Date  ? ABDOMINAL HYSTERECTOMY  2002  ? TONSILLECTOMY    ? ? ?Family History  ?Problem Relation Age of Onset  ? Alcohol abuse Father   ? Cancer Father   ?     lung  ? Diabetes Father   ? Arthritis Mother   ? Heart disease Mother   ?     Blockage & double bypass  ? Hypertension Mother   ? Diabetes Mother   ? Colon cancer Paternal Grandmother   ? Stroke Paternal Grandmother   ?     maternal grandfather  ? Breast cancer Maternal Grandmother   ? Heart disease Maternal Grandfather   ? Scoliosis Daughter   ? Cancer Paternal Grandfather   ?     lung  ? ? ?Social History  ? ?Socioeconomic History  ? Marital status: Married  ?  Spouse name: Not on file  ? Number of children: Not  on file  ? Years of education: Not on file  ? Highest education level: Not on file  ?Occupational History  ? Not on file  ?Tobacco Use  ? Smoking status: Never  ? Smokeless tobacco: Never  ?Substance and Sexual Activity  ? Alcohol use: Yes  ?  Comment: less than once a month, white wine or mixed drink  ? Drug use: No  ? Sexual activity: Not on file  ?Other Topics Concern  ? Not on file  ?Social History Narrative  ? Not on file  ? ?Social Determinants of Health  ? ?Financial Resource Strain: Not on file  ?Food Insecurity: Not on file  ?Transportation Needs: Not on file  ?Physical Activity: Not on file  ?Stress: Not on file  ?Social Connections: Not on file  ?Intimate Partner Violence: Not on file  ? ? ?Outpatient Medications Prior to Visit  ?Medication Sig Dispense Refill  ? fluticasone (FLOVENT HFA) 110 MCG/ACT inhaler Inhale 2 puffs into the lungs in the morning and at bedtime. Rinse mouth out after use. 1 each 1  ? levothyroxine (SYNTHROID) 100 MCG tablet TAKE 1 TABLET BY MOUTH EVERY DAY BEFORE BREAKFAST 90  tablet 1  ? loratadine (CLARITIN) 10 MG tablet Take 10 mg by mouth daily.    ? metFORMIN (GLUCOPHAGE) 500 MG tablet TAKE 2 TABLETS IN THE MORNING, 1 TABLET AT NOON AND 2 TABLETS IN THE EVENING 150 tablet 0  ? olmesartan (BENICAR) 20 MG tablet Take 0.5 tablets (10 mg total) by mouth daily. Needs ov before any further refills 45 tablet 0  ? LORazepam (ATIVAN) 0.5 MG tablet TAKE 1 TABLET BY MOUTH 2 TIMES DAILY AS NEEDED. 60 tablet 2  ? ?No facility-administered medications prior to visit.  ? ? ?Allergies  ?Allergen Reactions  ? Codeine Nausea And Vomiting  ?  vomiting  ? Clarithromycin Rash  ? ? ?Review of Systems  ?Constitutional:  Negative for fever and malaise/fatigue.  ?HENT:  Positive for congestion.   ?Eyes:  Negative for blurred vision.  ?Respiratory:  Positive for cough. Negative for shortness of breath.   ?Cardiovascular:  Negative for chest pain, palpitations and leg swelling.  ?Gastrointestinal:   Negative for abdominal pain, blood in stool and nausea.  ?Genitourinary:  Negative for dysuria and frequency.  ?Musculoskeletal:  Positive for myalgias. Negative for falls.  ?Skin:  Negative for rash.  ?Neurological:  Negative for dizziness, loss of consciousness and headaches.  ?Endo/Heme/Allergies:  Negative for environmental allergies.  ?Psychiatric/Behavioral:  Negative for depression. The patient is not nervous/anxious.   ? ?   ?Objective:  ?  ?Physical Exam ?Constitutional:   ?   General: She is not in acute distress. ?   Appearance: She is well-developed. She is obese.  ?HENT:  ?   Head: Normocephalic and atraumatic.  ?Eyes:  ?   Conjunctiva/sclera: Conjunctivae normal.  ?Neck:  ?   Thyroid: No thyromegaly.  ?Cardiovascular:  ?   Rate and Rhythm: Normal rate and regular rhythm.  ?   Heart sounds: Normal heart sounds. No murmur heard. ?Pulmonary:  ?   Effort: Pulmonary effort is normal. No respiratory distress.  ?   Breath sounds: Normal breath sounds.  ?Abdominal:  ?   General: Bowel sounds are normal. There is no distension.  ?   Palpations: Abdomen is soft. There is no mass.  ?   Tenderness: There is no abdominal tenderness.  ?Musculoskeletal:  ?   Cervical back: Neck supple.  ?Lymphadenopathy:  ?   Cervical: No cervical adenopathy.  ?Skin: ?   General: Skin is warm and dry.  ?Neurological:  ?   Mental Status: She is alert and oriented to person, place, and time.  ?Psychiatric:     ?   Behavior: Behavior normal.  ? ? ?BP 124/78   Pulse 85   Resp 20   Ht 5\' 2"  (1.575 m)   Wt 175 lb 12.8 oz (79.7 kg)   SpO2 96%   BMI 32.15 kg/m?  ?Wt Readings from Last 3 Encounters:  ?04/16/22 175 lb 12.8 oz (79.7 kg)  ?03/25/21 168 lb (76.2 kg)  ?12/27/20 172 lb 9.6 oz (78.3 kg)  ? ? ?Diabetic Foot Exam - Simple   ?No data filed ?  ? ?Lab Results  ?Component Value Date  ? WBC 8.1 04/16/2022  ? HGB 13.0 04/16/2022  ? HCT 38.0 04/16/2022  ? PLT 237.0 04/16/2022  ? GLUCOSE 188 (H) 04/16/2022  ? CHOL 158 04/16/2022  ? TRIG  120.0 04/16/2022  ? HDL 42.70 04/16/2022  ? [place] 92 04/16/2022  ? ALT 16 04/16/2022  ? AST 12 04/16/2022  ? NA 141 04/16/2022  ? K 3.9 04/16/2022  ?  CL 102 04/16/2022  ? CREATININE 0.74 04/16/2022  ? BUN 16 04/16/2022  ? CO2 29 04/16/2022  ? TSH 0.90 04/16/2022  ? HGBA1C 7.6 (H) 04/16/2022  ? MICROALBUR <0.7 06/12/2019  ? ? ?Lab Results  ?Component Value Date  ? TSH 0.90 04/16/2022  ? ?Lab Results  ?Component Value Date  ? WBC 8.1 04/16/2022  ? HGB 13.0 04/16/2022  ? HCT 38.0 04/16/2022  ? MCV 89.5 04/16/2022  ? PLT 237.0 04/16/2022  ? ?Lab Results  ?Component Value Date  ? NA 141 04/16/2022  ? K 3.9 04/16/2022  ? CO2 29 04/16/2022  ? GLUCOSE 188 (H) 04/16/2022  ? BUN 16 04/16/2022  ? CREATININE 0.74 04/16/2022  ? BILITOT 0.5 04/16/2022  ? ALKPHOS 108 04/16/2022  ? AST 12 04/16/2022  ? ALT 16 04/16/2022  ? PROT 6.1 04/16/2022  ? ALBUMIN 4.0 04/16/2022  ? CALCIUM 9.1 04/16/2022  ? GFR 83.36 04/16/2022  ? ?Lab Results  ?Component Value Date  ? CHOL 158 04/16/2022  ? ?Lab Results  ?Component Value Date  ? HDL 42.70 04/16/2022  ? ?Lab Results  ?Component Value Date  ? [place] 92 04/16/2022  ? ?Lab Results  ?Component Value Date  ? TRIG 120.0 04/16/2022  ? ?Lab Results  ?Component Value Date  ? CHOLHDL 4 04/16/2022  ? ?Lab Results  ?Component Value Date  ? HGBA1C 7.6 (H) 04/16/2022  ? ? ?   ?Assessment & Plan:  ? ?Problem List Items Addressed This Visit   ? ? Diabetes mellitus type 2 in obese (HCC) (Chronic)  ?  hgba1c acceptable, minimize simple carbs. Increase exercise as tolerated. Continue current meds. Started on Semiglutide 0.25 mg weekly x 4 weeks then increase to 0.5 mg weekly x 4 weeks and consider increasing doses from there as needed and as tolerated.  ? ?  ?  ? Relevant Medications  ? Semaglutide,0.25 or 0.5MG /DOS, (OZEMPIC, 0.25 OR 0.5 MG/DOSE,) 2 MG/1.5ML SOPN  ? Hypothyroidism  ?  On Levothyroxine, continue to monitor ? ?  ?  ? Relevant Orders  ? TSH (Completed)  ? B12 deficiency  ?  Supplement and  monitor ? ?  ?  ? Relevant Orders  ? Vitamin B12 (Completed)  ? Anxiety state  ?  Refill given on Lorazepam to use prn ? ?  ?  ? Relevant Medications  ? LORazepam (ATIVAN) 0.5 MG tablet  ? Obesity  ?  Encouraged DAS

## 2022-04-16 ENCOUNTER — Encounter: Payer: Self-pay | Admitting: Family Medicine

## 2022-04-16 ENCOUNTER — Ambulatory Visit (INDEPENDENT_AMBULATORY_CARE_PROVIDER_SITE_OTHER): Payer: Commercial Managed Care - PPO | Admitting: Family Medicine

## 2022-04-16 ENCOUNTER — Other Ambulatory Visit: Payer: Self-pay | Admitting: Family Medicine

## 2022-04-16 VITALS — BP 124/78 | HR 85 | Resp 20 | Ht 62.0 in | Wt 175.8 lb

## 2022-04-16 DIAGNOSIS — I10 Essential (primary) hypertension: Secondary | ICD-10-CM

## 2022-04-16 DIAGNOSIS — E785 Hyperlipidemia, unspecified: Secondary | ICD-10-CM

## 2022-04-16 DIAGNOSIS — E039 Hypothyroidism, unspecified: Secondary | ICD-10-CM

## 2022-04-16 DIAGNOSIS — E1165 Type 2 diabetes mellitus with hyperglycemia: Secondary | ICD-10-CM | POA: Diagnosis not present

## 2022-04-16 DIAGNOSIS — E538 Deficiency of other specified B group vitamins: Secondary | ICD-10-CM | POA: Diagnosis not present

## 2022-04-16 DIAGNOSIS — M859 Disorder of bone density and structure, unspecified: Secondary | ICD-10-CM

## 2022-04-16 DIAGNOSIS — E1169 Type 2 diabetes mellitus with other specified complication: Secondary | ICD-10-CM | POA: Diagnosis not present

## 2022-04-16 DIAGNOSIS — E669 Obesity, unspecified: Secondary | ICD-10-CM

## 2022-04-16 DIAGNOSIS — F411 Generalized anxiety disorder: Secondary | ICD-10-CM

## 2022-04-16 DIAGNOSIS — R252 Cramp and spasm: Secondary | ICD-10-CM | POA: Diagnosis not present

## 2022-04-16 DIAGNOSIS — R059 Cough, unspecified: Secondary | ICD-10-CM

## 2022-04-16 DIAGNOSIS — E6609 Other obesity due to excess calories: Secondary | ICD-10-CM

## 2022-04-16 MED ORDER — HYDROCODONE BIT-HOMATROP MBR 5-1.5 MG/5ML PO SOLN: 5.0000 mL | Freq: Four times a day (QID) | ORAL | 0 refills | Status: DC | PRN

## 2022-04-16 MED ORDER — LORAZEPAM 0.5 MG PO TABS: ORAL_TABLET | ORAL | 2 refills | Status: DC

## 2022-04-16 MED ORDER — OZEMPIC (0.25 OR 0.5 MG/DOSE) 2 MG/1.5ML ~~LOC~~ SOPN: 0.5000 mg | PEN_INJECTOR | SUBCUTANEOUS | 0 refills | Status: DC

## 2022-04-16 NOTE — Assessment & Plan Note (Signed)
On Levothyroxine, continue to monitor 

## 2022-04-16 NOTE — Assessment & Plan Note (Signed)
>>  ASSESSMENT AND PLAN FOR TYPE 2 DIABETES MELLITUS WITH OBESITY WRITTEN ON 04/20/2022 10:14 AM BY BLYTH, STACEY A, MD  hgba1c acceptable, minimize simple carbs. Increase exercise as tolerated. Continue current meds. Started on Semiglutide 0.25 mg weekly x 4 weeks then increase to 0.5 mg weekly x 4 weeks and consider increasing doses from there as needed and as tolerated.

## 2022-04-16 NOTE — Patient Instructions (Addendum)
Can increase Claritin to twice a day ?Add nasal saline ?Astelin a different kind of nose spray can be added ?Prescription Singulair/Montelukast can be added as needed ? ?Hyland's leg cramp medicine is over the counter ? ?Bone density shows osteopenia, which is thinner than normal but not as bad as osteoporosis. Recommend calcium intake of 1200 to 1500 mg daily, divided into roughly 3 doses. Best source is the diet and a single dairy serving is about 500 mg, a supplement of calcium citrate once or twice daily to balance diet is fine if not getting enough in diet. Also need Vitamin D 2000 IU caps, 1 cap daily if not already taking vitamin D. Also recommend weight baring exercise on hips and upper body to keep bones strong  ? ?Shingrix is the new shingles shot, 2 shots over 2-6 months, confirm coverage with insurance and document, then can return here for shots with nurse appt or at pharmacy  ? ?Tetanus if injured ?Prevnar 20 to prevent the most worrisome strains of pneumococcus ? ?Allergies, Adult ?An allergy is a condition in which the body's defense system (immune system) comes in contact with an allergen and reacts to it. An allergen is anything that causes an allergic reaction. Allergens cause the immune system to make proteins for fighting infections (antibodies). These antibodies cause cells to release chemicals called histamines that set off the symptoms of an allergic reaction. ?Allergies often affect the nasal passages (allergic rhinitis), eyes (allergic conjunctivitis), skin (atopic dermatitis), and stomach. Allergies can be mild, moderate, or severe. They cannot spread from person to person. Allergies can develop at any age and may be outgrown. ?What are the causes? ?This condition is caused by allergens. Common allergens include: ?Outdoor allergens, such as pollen, car fumes, and mold. ?Indoor allergens, such as dust, smoke, mold, and pet dander. ?Other allergens, such as foods, medicines, scents, insect  bites or stings, and other skin irritants. ?What increases the risk? ?You are more likely to develop this condition if you have: ?Family members with allergies. ?Family members who have any condition that may be caused by allergens, such as asthma. This may make you more likely to have other allergies. ?What are the signs or symptoms? ?Symptoms of this condition depend on the severity of the allergy. ?Mild to moderate symptoms ?Runny nose, stuffy nose (nasal congestion), or sneezing. ?Itchy mouth, ears, or throat. ?A feeling of mucus dripping down the back of your throat (postnasal drip). ?Sore throat. ?Itchy, red, watery, or puffy eyes. ?Skin rash, or itchy, red, swollen areas of skin (hives). ?Stomach cramps or bloating. ?Severe symptoms ?Severe allergies to food, medicine, or insect bites may cause anaphylaxis, which can be life-threatening. Symptoms include: ?A red (flushed) face. ?Wheezing or coughing. ?Swollen lips, tongue, or mouth. ?Tight or swollen throat. ?Chest pain or tightness, or rapid heartbeat. ?Trouble breathing or shortness of breath. ?Pain in the abdomen, vomiting, or diarrhea. ?Dizziness or fainting. ?How is this diagnosed? ?This condition is diagnosed based on your symptoms, your family and medical history, and a physical exam. You may also have tests, including: ?Skin tests to see how your skin reacts to allergens that may be causing your symptoms. Tests include: ?Skin prick test. For this test, an allergen is introduced to your body through a small opening in the skin. ?Intradermal skin test. For this test, a small amount of allergen is injected under the first layer of your skin. ?Patch test. For this test, a small amount of allergen is placed on your skin.  The area is covered and then checked after a few days. ?Blood tests. ?A challenge test. For this test, you will eat or breathe in a small amount of allergen to see if you have an allergic reaction. ?You may also be asked to: ?Keep a food  diary. This is a record of all the foods, drinks, and symptoms you have in a day. ?Try an elimination diet. To do this: ?Remove certain foods from your diet. ?Add those foods back one by one to find out if any foods cause an allergic reaction. ?How is this treated? ? ?  ? ?Treatment for allergies depends on your symptoms. Treatment may include: ?Cold, wet cloths (cold compresses) to soothe itching and swelling. ?Eye drops or nasal sprays. ?Nasal irrigation to help clear your mucus or keep the nasal passages moist. ?A humidifier to add moisture to the air. ?Skin creams to treat rashes or itching. ?Oral antihistamines or other medicines to block the reaction or to treat inflammation. ?Diet changes to remove foods that cause allergies. ?Being exposed again and again to tiny amounts of allergens to help you build a defense against it (tolerance). This is called immunotherapy. Examples include: ?Allergy shot. You receive an injection that contains an allergen. ?Sublingual immunotherapy. You take a small dose of allergen under your tongue. ?Emergency injection for anaphylaxis. You give yourself a shot using a syringe (auto-injector) that contains the amount of medicine you need. Your health care provider will teach you how to give yourself an injection. ?Follow these instructions at home: ?Medicines ? ?Take or apply over-the-counter and prescription medicines only as told by your health care provider. ?Always carry your auto-injector pen if you are at risk of anaphylaxis. Give yourself an injection as told by your health care provider. ?Eating and drinking ?Follow instructions from your health care provider about eating or drinking restrictions. ?Drink enough fluid to keep your urine pale yellow. ?General instructions ?Wear a medical alert bracelet or necklace to let others know that you have had anaphylaxis before. ?Avoid known allergens whenever possible. ?Keep all follow-up visits as told by your health care provider.  This is important. ?Contact a health care provider if: ?Your symptoms do not get better with treatment. ?Get help right away if: ?You have symptoms of anaphylaxis. These include: ?Swollen mouth, tongue, or throat. ?Pain or tightness in your chest. ?Trouble breathing or shortness of breath. ?Dizziness or fainting. ?Severe abdominal pain, vomiting, or diarrhea. ?These symptoms may represent a serious problem that is an emergency. Do not wait to see if the symptoms will go away. Get medical help right away. Call your local emergency services (911 in the [place] Do not drive yourself to the hospital. ?Summary ?Take or apply over-the-counter and prescription medicines only as told by your health care provider. ?Avoid known allergens when possible. ?Always carry your auto-injector pen if you are at risk of anaphylaxis. Give yourself an injection as told by your health care provider. ?Wear a medical alert bracelet or necklace to let others know that you have had anaphylaxis before. ?Anaphylaxis is a life-threatening emergency. Get help right away. ?This information is not intended to replace advice given to you by your health care provider. Make sure you discuss any questions you have with your health care provider. ?Document Revised: 07/29/2020 Document Reviewed: 10/11/2019 ?Elsevier Patient Education ? [place]. ? ?

## 2022-04-16 NOTE — Assessment & Plan Note (Addendum)
hgba1c acceptable, minimize simple carbs. Increase exercise as tolerated. Continue current meds. Started on Semiglutide 0.25 mg weekly x 4 weeks then increase to 0.5 mg weekly x 4 weeks and consider increasing doses from there as needed and as tolerated.  ?

## 2022-04-16 NOTE — Assessment & Plan Note (Signed)
Well controlled, no changes to meds. Encouraged heart healthy diet such as the DASH diet and exercise as tolerated.  °

## 2022-04-16 NOTE — Assessment & Plan Note (Signed)
Supplement and monitor 

## 2022-04-16 NOTE — Assessment & Plan Note (Signed)
Encourage heart healthy diet such as MIND or DASH diet, increase exercise, avoid trans fats, simple carbohydrates and processed foods, consider a krill or fish or flaxseed oil cap daily.  °

## 2022-04-17 LAB — CBC
HCT: 38 % (ref 36.0–46.0)
Hemoglobin: 13 g/dL (ref 12.0–15.0)
MCHC: 34.1 g/dL (ref 30.0–36.0)
MCV: 89.5 fl (ref 78.0–100.0)
Platelets: 237 10*3/uL (ref 150.0–400.0)
RBC: 4.24 Mil/uL (ref 3.87–5.11)
RDW: 13.2 % (ref 11.5–15.5)
WBC: 8.1 10*3/uL (ref 4.0–10.5)

## 2022-04-17 LAB — COMPREHENSIVE METABOLIC PANEL
ALT: 16 U/L (ref 0–35)
AST: 12 U/L (ref 0–37)
Albumin: 4 g/dL (ref 3.5–5.2)
Alkaline Phosphatase: 108 U/L (ref 39–117)
BUN: 16 mg/dL (ref 6–23)
CO2: 29 mEq/L (ref 19–32)
Calcium: 9.1 mg/dL (ref 8.4–10.5)
Chloride: 102 mEq/L (ref 96–112)
Creatinine, Ser: 0.74 mg/dL (ref 0.40–1.20)
GFR: 83.36 mL/min (ref >60.00)
Glucose, Bld: 188 mg/dL — ABNORMAL HIGH (ref 70–99)
Potassium: 3.9 mEq/L (ref 3.5–5.1)
Sodium: 141 mEq/L (ref 135–145)
Total Bilirubin: 0.5 mg/dL (ref 0.2–1.2)
Total Protein: 6.1 g/dL (ref 6.0–8.3)

## 2022-04-17 LAB — LIPID PANEL
Cholesterol: 158 mg/dL (ref 0–200)
HDL: 42.7 mg/dL (ref >39.00)
LDL Cholesterol: 92 mg/dL (ref 0–99)
NonHDL: 115.6
Total CHOL/HDL Ratio: 4
Triglycerides: 120 mg/dL (ref 0.0–149.0)
VLDL: 24 mg/dL (ref 0.0–40.0)

## 2022-04-17 LAB — VITAMIN B12: Vitamin B-12: 426 pg/mL (ref 211–911)

## 2022-04-17 LAB — CK: Total CK: 35 U/L (ref 7–177)

## 2022-04-17 LAB — TSH: TSH: 0.9 u[IU]/mL (ref 0.35–5.50)

## 2022-04-17 LAB — VITAMIN D 25 HYDROXY (VIT D DEFICIENCY, FRACTURES): VITD: 60.24 ng/mL (ref 30.00–100.00)

## 2022-04-17 LAB — HEMOGLOBIN A1C: Hgb A1c MFr Bld: 7.6 % — ABNORMAL HIGH (ref 4.6–6.5)

## 2022-04-17 LAB — MAGNESIUM: Magnesium: 1.8 mg/dL (ref 1.5–2.5)

## 2022-04-20 ENCOUNTER — Other Ambulatory Visit: Payer: Self-pay | Admitting: Family Medicine

## 2022-04-20 DIAGNOSIS — R059 Cough, unspecified: Secondary | ICD-10-CM | POA: Insufficient documentation

## 2022-04-20 NOTE — Assessment & Plan Note (Signed)
Encouraged DASH or MIND diet, decrease po intake and increase exercise as tolerated. Needs 7-8 hours of sleep nightly. Avoid trans fats, eat small, frequent meals every 4-5 hours with lean proteins, complex carbs and healthy fats. Minimize simple carbs, high fat foods and processed foods 

## 2022-04-20 NOTE — Assessment & Plan Note (Signed)
Refill given on Lorazepam to use prn ?

## 2022-04-20 NOTE — Assessment & Plan Note (Signed)
She notes her allergies have been flared this month and caused a cough that is worse at night, is allowed a prescription for Hydromet which has been helpful in the past. She will let us know if symptoms worsen. ?

## 2022-04-20 NOTE — Assessment & Plan Note (Signed)
Hydrate and monitor labs 

## 2022-04-23 ENCOUNTER — Ambulatory Visit: Payer: Commercial Managed Care - PPO | Admitting: Family Medicine

## 2022-04-23 ENCOUNTER — Encounter: Payer: Self-pay | Admitting: Family Medicine

## 2022-04-24 NOTE — Telephone Encounter (Signed)
Spoke with patient and she was speaking about the pen needles.  I advised that that was just the size of the needle and it did not have anything to do with the pen. ?

## 2022-05-08 ENCOUNTER — Other Ambulatory Visit: Payer: Self-pay | Admitting: Family Medicine

## 2022-05-12 ENCOUNTER — Other Ambulatory Visit: Payer: Self-pay | Admitting: Family Medicine

## 2022-07-03 ENCOUNTER — Other Ambulatory Visit: Payer: Self-pay

## 2022-07-03 MED ORDER — OZEMPIC (0.25 OR 0.5 MG/DOSE) 2 MG/3ML ~~LOC~~ SOPN
0.5000 mg | PEN_INJECTOR | SUBCUTANEOUS | 2 refills | Status: DC
Start: 2022-07-03 — End: 2022-08-03

## 2022-07-28 LAB — HM MAMMOGRAPHY

## 2022-08-02 NOTE — Assessment & Plan Note (Signed)
Supplement and monitor 

## 2022-08-02 NOTE — Assessment & Plan Note (Signed)
Encouraged DASH or MIND diet, decrease po intake and increase exercise as tolerated. Needs 7-8 hours of sleep nightly. Avoid trans fats, eat small, frequent meals every 4-5 hours with lean proteins, complex carbs and healthy fats. Minimize simple carbs, high fat foods and processed foods 

## 2022-08-02 NOTE — Assessment & Plan Note (Signed)
hgba1c acceptable, minimize simple carbs. Increase exercise as tolerated. Continue current meds 

## 2022-08-02 NOTE — Assessment & Plan Note (Signed)
Encourage heart healthy diet such as MIND or DASH diet, increase exercise, avoid trans fats, simple carbohydrates and processed foods, consider a krill or fish or flaxseed oil cap daily.  °

## 2022-08-02 NOTE — Assessment & Plan Note (Signed)
Well controlled, no changes to meds. Encouraged heart healthy diet such as the DASH diet and exercise as tolerated.  °

## 2022-08-02 NOTE — Assessment & Plan Note (Signed)
On Levothyroxine, continue to monitor 

## 2022-08-02 NOTE — Assessment & Plan Note (Signed)
>>  ASSESSMENT AND PLAN FOR TYPE 2 DIABETES MELLITUS WITH OBESITY WRITTEN ON 08/02/2022  6:55 PM BY BLYTH, STACEY A, MD  hgba1c acceptable, minimize simple carbs. Increase exercise as tolerated. Continue current meds

## 2022-08-03 ENCOUNTER — Ambulatory Visit (INDEPENDENT_AMBULATORY_CARE_PROVIDER_SITE_OTHER): Payer: Commercial Managed Care - PPO | Admitting: Family Medicine

## 2022-08-03 DIAGNOSIS — E6609 Other obesity due to excess calories: Secondary | ICD-10-CM | POA: Diagnosis not present

## 2022-08-03 DIAGNOSIS — E039 Hypothyroidism, unspecified: Secondary | ICD-10-CM

## 2022-08-03 DIAGNOSIS — F411 Generalized anxiety disorder: Secondary | ICD-10-CM

## 2022-08-03 DIAGNOSIS — E669 Obesity, unspecified: Secondary | ICD-10-CM | POA: Diagnosis not present

## 2022-08-03 DIAGNOSIS — E1169 Type 2 diabetes mellitus with other specified complication: Secondary | ICD-10-CM | POA: Diagnosis not present

## 2022-08-03 DIAGNOSIS — I10 Essential (primary) hypertension: Secondary | ICD-10-CM

## 2022-08-03 DIAGNOSIS — E538 Deficiency of other specified B group vitamins: Secondary | ICD-10-CM

## 2022-08-03 DIAGNOSIS — E785 Hyperlipidemia, unspecified: Secondary | ICD-10-CM

## 2022-08-03 LAB — COMPREHENSIVE METABOLIC PANEL
ALT: 16 U/L (ref 0–35)
AST: 15 U/L (ref 0–37)
Albumin: 4.1 g/dL (ref 3.5–5.2)
Alkaline Phosphatase: 93 U/L (ref 39–117)
BUN: 15 mg/dL (ref 6–23)
CO2: 29 mEq/L (ref 19–32)
Calcium: 9.2 mg/dL (ref 8.4–10.5)
Chloride: 104 mEq/L (ref 96–112)
Creatinine, Ser: 0.7 mg/dL (ref 0.40–1.20)
GFR: 88.93 mL/min (ref >60.00)
Glucose, Bld: 94 mg/dL (ref 70–99)
Potassium: 4.1 mEq/L (ref 3.5–5.1)
Sodium: 141 mEq/L (ref 135–145)
Total Bilirubin: 0.9 mg/dL (ref 0.2–1.2)
Total Protein: 6.2 g/dL (ref 6.0–8.3)

## 2022-08-03 LAB — CBC
HCT: 37.3 % (ref 36.0–46.0)
Hemoglobin: 12.8 g/dL (ref 12.0–15.0)
MCHC: 34.3 g/dL (ref 30.0–36.0)
MCV: 89.4 fl (ref 78.0–100.0)
Platelets: 217 10*3/uL (ref 150.0–400.0)
RBC: 4.17 Mil/uL (ref 3.87–5.11)
RDW: 13.1 % (ref 11.5–15.5)
WBC: 6.6 10*3/uL (ref 4.0–10.5)

## 2022-08-03 LAB — MICROALBUMIN / CREATININE URINE RATIO
Creatinine,U: 45.4 mg/dL
Microalb Creat Ratio: 1.5 mg/g (ref 0.0–30.0)
Microalb, Ur: <0.7 mg/dL (ref 0.0–1.9)

## 2022-08-03 LAB — LIPID PANEL
Cholesterol: 144 mg/dL (ref 0–200)
HDL: 41.4 mg/dL (ref >39.00)
LDL Cholesterol: 85 mg/dL (ref 0–99)
NonHDL: 102.62
Total CHOL/HDL Ratio: 3
Triglycerides: 86 mg/dL (ref 0.0–149.0)
VLDL: 17.2 mg/dL (ref 0.0–40.0)

## 2022-08-03 LAB — TSH: TSH: 0.15 u[IU]/mL — ABNORMAL LOW (ref 0.35–5.50)

## 2022-08-03 LAB — HEMOGLOBIN A1C: Hgb A1c MFr Bld: 6.3 % (ref 4.6–6.5)

## 2022-08-03 MED ORDER — SEMAGLUTIDE (1 MG/DOSE) 4 MG/3ML ~~LOC~~ SOPN: 1.0000 mg | PEN_INJECTOR | SUBCUTANEOUS | 1 refills | Status: DC

## 2022-08-03 MED ORDER — METFORMIN HCL 500 MG PO TABS
500.0000 mg | ORAL_TABLET | Freq: Two times a day (BID) | ORAL | 1 refills | Status: DC
Start: 2022-08-03 — End: 2024-11-08

## 2022-08-03 NOTE — Progress Notes (Signed)
Subjective:    Patient ID: Tiffany Mckenzie, female    DOB: [birth year], 66 y.o.   MRN: [id number]  Chief Complaint  Patient presents with   Follow-up    3 month    HPI Patient is in today for follow-up on chronic medical concerns.  No recent febrile illness or acute hospitalizations.  She is trying to stay active and maintain a heart healthy diet and overall feels well.  Did have some trouble with nausea and diarrhea on her increased Ozempic dose but that has settled down now.  She does note the appetite suppression and weight loss have plateaued and is interested in increasing to the next dose.  She has dropped her metformin to 500 mg twice daily and her blood sugars have been well maintained in the 90-1 30 range essentially. Denies CP/palp/SOB/HA/congestion/fevers/GI or GU c/o. Taking meds as prescribed   Past Medical History:  Diagnosis Date   Allergic rhinitis    Allergic state 03/03/2015   Annual physical exam 02/09/2012   Anxiety state 09/30/2016   Arthritis    Asthma    Constipation 03/03/2015   Diabetes mellitus type 2 in obese (HCC) 09/13/2012   History of chicken pox    History of hepatitis    age 1   Hyperglycemia 09/13/2012   Hypertension    Hypothyroidism    Left carotid bruit 03/11/2016   Obesity 03/28/2017   Overactive bladder 03/17/2013   Seeing urology   Raynaud phenomenon 03/03/2015   Type II or unspecified type diabetes mellitus without mention of complication, uncontrolled 09/13/2012    Past Surgical History:  Procedure Laterality Date   ABDOMINAL HYSTERECTOMY  2002   TONSILLECTOMY      Family History  Problem Relation Age of Onset   Alcohol abuse Father    Cancer Father        lung   Diabetes Father    Arthritis Mother    Heart disease Mother        Blockage & double bypass   Hypertension Mother    Diabetes Mother    Colon cancer Paternal Grandmother    Stroke Paternal Grandmother        maternal grandfather   Breast cancer Maternal Grandmother     Heart disease Maternal Grandfather    Scoliosis Daughter    Cancer Paternal Grandfather        lung    Social History   Socioeconomic History   Marital status: Married    Spouse name: Not on file   Number of children: Not on file   Years of education: Not on file   Highest education level: Not on file  Occupational History   Not on file  Tobacco Use   Smoking status: Never   Smokeless tobacco: Never  Substance and Sexual Activity   Alcohol use: Yes    Comment: less than once a month, white wine or mixed drink   Drug use: No   Sexual activity: Not on file  Other Topics Concern   Not on file  Social History Narrative   Not on file   Social Determinants of Health   Financial Resource Strain: Not on file  Food Insecurity: Not on file  Transportation Needs: Not on file  Physical Activity: Not on file  Stress: Not on file  Social Connections: Not on file  Intimate Partner Violence: Not on file    Outpatient Medications Prior to Visit  Medication Sig Dispense Refill   fluticasone (FLOVENT HFA) 110  MCG/ACT inhaler Inhale 2 puffs into the lungs in the morning and at bedtime. Rinse mouth out after use. 1 each 1   HYDROcodone bit-homatropine (HYDROMET) 5-1.5 MG/5ML syrup Take 5 mLs by mouth every 6 (six) hours as needed for cough. 160 mL 0   levothyroxine (SYNTHROID) 100 MCG tablet TAKE 1 TABLET BY MOUTH EVERY DAY BEFORE BREAKFAST 90 tablet 1   loratadine (CLARITIN) 10 MG tablet Take 10 mg by mouth daily.     LORazepam (ATIVAN) 0.5 MG tablet TAKE 1 TABLET BY MOUTH 2 TIMES DAILY AS NEEDED. 60 tablet 2   olmesartan (BENICAR) 20 MG tablet Take 0.5 tablets (10 mg total) by mouth daily. 45 tablet 0   metFORMIN (GLUCOPHAGE) 500 MG tablet TAKE 2 TABLETS IN THE MORNING, 1 TABLET AT NOON AND 2 TABLETS IN THE EVENING 150 tablet 1   Semaglutide,0.25 or 0.5MG /DOS, (OZEMPIC, 0.25 OR 0.5 MG/DOSE,) 2 MG/3ML SOPN Inject 0.5 mg into the skin once a week. 3 mL 2   No facility-administered  medications prior to visit.    Allergies  Allergen Reactions   Codeine Nausea And Vomiting    vomiting   Clarithromycin Rash    Review of Systems  Constitutional:  Negative for fever.  HENT:  Negative for congestion.   Eyes:  Negative for blurred vision.  Respiratory:  Negative for cough.   Cardiovascular:  Negative for chest pain and palpitations.  Gastrointestinal:  Negative for vomiting.  Musculoskeletal:  Negative for back pain.  Skin:  Negative for rash.  Neurological:  Negative for loss of consciousness and headaches.       Objective:    Physical Exam Constitutional:      General: She is not in acute distress.    Appearance: She is well-developed.  HENT:     Head: Normocephalic and atraumatic.  Eyes:     Conjunctiva/sclera: Conjunctivae normal.  Neck:     Thyroid: No thyromegaly.  Cardiovascular:     Rate and Rhythm: Normal rate and regular rhythm.     Heart sounds: Normal heart sounds. No murmur heard. Pulmonary:     Effort: Pulmonary effort is normal. No respiratory distress.     Breath sounds: Normal breath sounds.  Abdominal:     General: Bowel sounds are normal. There is no distension.     Palpations: Abdomen is soft. There is no mass.     Tenderness: There is no abdominal tenderness.  Musculoskeletal:     Cervical back: Neck supple.  Lymphadenopathy:     Cervical: No cervical adenopathy.  Skin:    General: Skin is warm and dry.  Neurological:     Mental Status: She is alert and oriented to person, place, and time.  Psychiatric:        Behavior: Behavior normal.     BP 128/76   Pulse 80   Temp 98.1 F (36.7 C)   Resp 18   Ht 5\' 2"  (1.575 m)   Wt 162 lb 12.8 oz (73.8 kg)   SpO2 97%   BMI 29.78 kg/m  Wt Readings from Last 3 Encounters:  08/03/22 162 lb 12.8 oz (73.8 kg)  04/16/22 175 lb 12.8 oz (79.7 kg)  03/25/21 168 lb (76.2 kg)    Diabetic Foot Exam - Simple   No data filed    Lab Results  Component Value Date   WBC 8.1  04/16/2022   HGB 13.0 04/16/2022   HCT 38.0 04/16/2022   PLT 237.0 04/16/2022   GLUCOSE 188 (H)  04/16/2022   CHOL 158 04/16/2022   TRIG 120.0 04/16/2022   HDL 42.70 04/16/2022   LDLCALC 92 04/16/2022   ALT 16 04/16/2022   AST 12 04/16/2022   NA 141 04/16/2022   K 3.9 04/16/2022   CL 102 04/16/2022   CREATININE 0.74 04/16/2022   BUN 16 04/16/2022   CO2 29 04/16/2022   TSH 0.90 04/16/2022   HGBA1C 7.6 (H) 04/16/2022   MICROALBUR <0.7 06/12/2019    Lab Results  Component Value Date   TSH 0.90 04/16/2022   Lab Results  Component Value Date   WBC 8.1 04/16/2022   HGB 13.0 04/16/2022   HCT 38.0 04/16/2022   MCV 89.5 04/16/2022   PLT 237.0 04/16/2022   Lab Results  Component Value Date   NA 141 04/16/2022   K 3.9 04/16/2022   CO2 29 04/16/2022   GLUCOSE 188 (H) 04/16/2022   BUN 16 04/16/2022   CREATININE 0.74 04/16/2022   BILITOT 0.5 04/16/2022   ALKPHOS 108 04/16/2022   AST 12 04/16/2022   ALT 16 04/16/2022   PROT 6.1 04/16/2022   ALBUMIN 4.0 04/16/2022   CALCIUM 9.1 04/16/2022   GFR 83.36 04/16/2022   Lab Results  Component Value Date   CHOL 158 04/16/2022   Lab Results  Component Value Date   HDL 42.70 04/16/2022   Lab Results  Component Value Date   LDLCALC 92 04/16/2022   Lab Results  Component Value Date   TRIG 120.0 04/16/2022   Lab Results  Component Value Date   CHOLHDL 4 04/16/2022   Lab Results  Component Value Date   HGBA1C 7.6 (H) 04/16/2022       Assessment & Plan:   Problem List Items Addressed This Visit     Diabetes mellitus type 2 in obese (HCC) (Chronic)    hgba1c acceptable, minimize simple carbs. Increase exercise as tolerated. Continue current meds      Relevant Medications   metFORMIN (GLUCOPHAGE) 500 MG tablet   Semaglutide, 1 MG/DOSE, 4 MG/3ML SOPN   Other Relevant Orders   Hemoglobin A1c   Microalbumin / creatinine urine ratio   Hypothyroidism    On Levothyroxine, continue to monitor      Relevant Orders    TSH   B12 deficiency    Supplement and monitor      Anxiety state    She is doing well despite having her 76 year old granddaughter and her infant daughter are living with them. They are doing well and only uses Ativan infrequently with good results      Obesity    Encouraged DASH or MIND diet, decrease po intake and increase exercise as tolerated. Needs 7-8 hours of sleep nightly. Avoid trans fats, eat small, frequent meals every 4-5 hours with lean proteins, complex carbs and healthy fats. Minimize simple carbs, high fat foods and processed foods      Relevant Medications   metFORMIN (GLUCOPHAGE) 500 MG tablet   Semaglutide, 1 MG/DOSE, 4 MG/3ML SOPN   Hyperlipidemia associated with type 2 diabetes mellitus (HCC)    Encourage heart healthy diet such as MIND or DASH diet, increase exercise, avoid trans fats, simple carbohydrates and processed foods, consider a krill or fish or flaxseed oil cap daily.       Relevant Medications   metFORMIN (GLUCOPHAGE) 500 MG tablet   Semaglutide, 1 MG/DOSE, 4 MG/3ML SOPN   Other Relevant Orders   Lipid panel   Primary hypertension    Well controlled, no changes  to meds. Encouraged heart healthy diet such as the DASH diet and exercise as tolerated.       Relevant Orders   CBC   Comprehensive metabolic panel    I have discontinued Pacey A. Madore's Ozempic (0.25 or 0.5 MG/DOSE). I have also changed her metFORMIN. Additionally, I am having her start on Semaglutide (1 MG/DOSE). Lastly, I am having her maintain her loratadine, Flovent HFA, levothyroxine, LORazepam, HYDROcodone bit-homatropine, and olmesartan.  Meds ordered this encounter  Medications   metFORMIN (GLUCOPHAGE) 500 MG tablet    Sig: Take 1 tablet (500 mg total) by mouth 2 (two) times daily with a meal.    Dispense:  90 tablet    Refill:  1   Semaglutide, 1 MG/DOSE, 4 MG/3ML SOPN    Sig: Inject 1 mg as directed once a week.    Dispense:  3 mL    Refill:  1     Danise Edge,  MD

## 2022-08-03 NOTE — Assessment & Plan Note (Addendum)
She is doing well despite having her 67 year old granddaughter and her infant daughter are living with them. They are doing well and only uses Ativan infrequently with good results

## 2022-08-03 NOTE — Patient Instructions (Addendum)
Tetanus if injured til 2025.  Shingrix is the new shingles shot, 2 shots over 2-6 months, confirm coverage with insurance and document, then can return here for shots with nurse appt or at pharmacy   Prevnar 20 last pneumonia  New COVID shot late September or October  RSV (respiratory syncitial virus) immunization at pharmacy  Miralax with Benefiber daily   Carbohydrate Counting for Diabetes Mellitus, Adult Carbohydrate counting is a method of keeping track of how many carbohydrates you eat. Eating carbohydrates increases the amount of sugar (glucose) in the blood. Counting how many carbohydrates you eat improves how well you manage your blood glucose. This, in turn, helps you manage your diabetes. Carbohydrates are measured in grams (g) per serving. It is important to know how many carbohydrates (in grams or by serving size) you can have in each meal. This is different for every person. A dietitian can help you make a meal plan and calculate how many carbohydrates you should have at each meal and snack. What foods contain carbohydrates? Carbohydrates are found in the following foods: Grains, such as breads and cereals. Dried beans and soy products. Starchy vegetables, such as potatoes, peas, and corn. Fruit and fruit juices. Milk and yogurt. Sweets and snack foods, such as cake, cookies, candy, chips, and soft drinks. How do I count carbohydrates in foods? There are two ways to count carbohydrates in food. You can read food labels or learn standard serving sizes of foods. You can use either of these methods or a combination of both. Using the Nutrition Facts label The Nutrition Facts list is included on the labels of almost all packaged foods and beverages in the [place]. It includes: The serving size. Information about nutrients in each serving, including the grams of carbohydrate per serving. To use the Nutrition Facts, decide how many servings you will have. Then, multiply the  number of servings by the number of carbohydrates per serving. The resulting number is the total grams of carbohydrates that you will be having. Learning the standard serving sizes of foods When you eat carbohydrate foods that are not packaged or do not include Nutrition Facts on the label, you need to measure the servings in order to count the grams of carbohydrates. Measure the foods that you will eat with a food scale or measuring cup, if needed. Decide how many standard-size servings you will eat. Multiply the number of servings by 15. For foods that contain carbohydrates, one serving equals 15 g of carbohydrates. For example, if you eat 2 cups or 10 oz (300 g) of strawberries, you will have eaten 2 servings and 30 g of carbohydrates (2 servings x 15 g = 30 g). For foods that have more than one food mixed, such as soups and casseroles, you must count the carbohydrates in each food that is included. The following list contains standard serving sizes of common carbohydrate-rich foods. Each of these servings has about 15 g of carbohydrates: 1 slice of bread. 1 six-inch (15 cm) tortilla. ? cup or 2 oz (53 g) cooked rice or pasta.  cup or 3 oz (85 g) cooked or canned, drained and rinsed beans or lentils.  cup or 3 oz (85 g) starchy vegetable, such as peas, corn, or squash.  cup or 4 oz (120 g) hot cereal.  cup or 3 oz (85 g) boiled or mashed potatoes, or  or 3 oz (85 g) of a large baked potato.  cup or 4 fl oz (118 mL) fruit juice.  1 cup or 8 fl oz (237 mL) milk. 1 small or 4 oz (106 g) apple.  or 2 oz (63 g) of a medium banana. 1 cup or 5 oz (150 g) strawberries. 3 cups or 1 oz (28.3 g) popped popcorn. What is an example of carbohydrate counting? To calculate the grams of carbohydrates in this sample meal, follow the steps shown below. Sample meal 3 oz (85 g) chicken breast. ? cup or 4 oz (106 g) brown rice.  cup or 3 oz (85 g) corn. 1 cup or 8 fl oz (237 mL) milk. 1 cup or 5 oz  (150 g) strawberries with sugar-free whipped topping. Carbohydrate calculation Identify the foods that contain carbohydrates: Rice. Corn. Milk. Strawberries. Calculate how many servings you have of each food: 2 servings rice. 1 serving corn. 1 serving milk. 1 serving strawberries. Multiply each number of servings by 15 g: 2 servings rice x 15 g = 30 g. 1 serving corn x 15 g = 15 g. 1 serving milk x 15 g = 15 g. 1 serving strawberries x 15 g = 15 g. Add together all of the amounts to find the total grams of carbohydrates eaten: 30 g + 15 g + 15 g + 15 g = 75 g of carbohydrates total. What are tips for following this plan? Shopping Develop a meal plan and then make a shopping list. Buy fresh and frozen vegetables, fresh and frozen fruit, dairy, eggs, beans, lentils, and whole grains. Look at food labels. Choose foods that have more fiber and less sugar. Avoid processed foods and foods with added sugars. Meal planning Aim to have the same number of grams of carbohydrates at each meal and for each snack time. Plan to have regular, balanced meals and snacks. Where to find more information American Diabetes Association: diabetes.org Centers for Disease Control and Prevention: TonerPromos.no Academy of Nutrition and Dietetics: eatright.org Association of Diabetes Care & Education Specialists: diabeteseducator.org Summary Carbohydrate counting is a method of keeping track of how many carbohydrates you eat. Eating carbohydrates increases the amount of sugar (glucose) in your blood. Counting how many carbohydrates you eat improves how well you manage your blood glucose. This helps you manage your diabetes. A dietitian can help you make a meal plan and calculate how many carbohydrates you should have at each meal and snack. This information is not intended to replace advice given to you by your health care provider. Make sure you discuss any questions you have with your health care  provider. Document Revised: 07/03/2020 Document Reviewed: 07/03/2020 Elsevier Patient Education  2023 [employer].

## 2022-08-04 ENCOUNTER — Telehealth: Payer: Self-pay | Admitting: *Deleted

## 2022-08-04 ENCOUNTER — Other Ambulatory Visit: Payer: Self-pay | Admitting: Family Medicine

## 2022-08-04 MED ORDER — TRIAMCINOLONE ACETONIDE 0.1 % EX CREA: 1.0000 | TOPICAL_CREAM | Freq: Two times a day (BID) | CUTANEOUS | 1 refills | Status: AC | PRN

## 2022-08-04 NOTE — Telephone Encounter (Signed)
Notified pt Rx has been sent

## 2022-08-04 NOTE — Telephone Encounter (Signed)
Spoke with pt. She states her dermatologist has retired and used to prescribe her Triamcinolone Acetonide 0.1% to apply one to two times daily as needed for "bumps that break out on her skin periodically". She is wanting to know if PCP will prescribe medication for her since dermatologist has retired. If so, she would like RX sent to [facility].  Pt would like to be notified of outcome.  Please advise?

## 2022-08-06 ENCOUNTER — Other Ambulatory Visit: Payer: Self-pay | Admitting: Family Medicine

## 2022-08-21 ENCOUNTER — Telehealth: Payer: Commercial Managed Care - PPO | Admitting: Physician Assistant

## 2022-08-21 DIAGNOSIS — R3989 Other symptoms and signs involving the genitourinary system: Secondary | ICD-10-CM

## 2022-08-21 MED ORDER — CEPHALEXIN 500 MG PO CAPS: 500.0000 mg | ORAL_CAPSULE | Freq: Two times a day (BID) | ORAL | 0 refills | Status: DC

## 2022-08-21 NOTE — Progress Notes (Signed)

## 2022-09-08 ENCOUNTER — Other Ambulatory Visit: Payer: Self-pay | Admitting: Family Medicine

## 2022-10-10 ENCOUNTER — Other Ambulatory Visit: Payer: Self-pay | Admitting: Family Medicine

## 2022-10-14 ENCOUNTER — Telehealth: Payer: Self-pay | Admitting: Family Medicine

## 2022-10-14 ENCOUNTER — Other Ambulatory Visit: Payer: Self-pay | Admitting: Family Medicine

## 2022-10-14 NOTE — Telephone Encounter (Signed)
Medication: ozempic  Has the patient contacted their pharmacy? Yes.     Preferred Pharmacy:   [id number] - [place], [place] - [address] [place], [place] [address] Phone: [phone]  Fax: [phone]

## 2022-10-15 ENCOUNTER — Other Ambulatory Visit: Payer: Self-pay

## 2022-10-15 MED ORDER — OZEMPIC (1 MG/DOSE) 4 MG/3ML ~~LOC~~ SOPN: PEN_INJECTOR | SUBCUTANEOUS | 3 refills | Status: DC

## 2022-10-15 NOTE — Telephone Encounter (Signed)
Medication was sent

## 2022-10-19 ENCOUNTER — Ambulatory Visit (INDEPENDENT_AMBULATORY_CARE_PROVIDER_SITE_OTHER): Payer: Commercial Managed Care - PPO | Admitting: Family Medicine

## 2022-10-19 VITALS — BP 124/72 | HR 82 | Temp 98.0°F | Resp 16 | Ht 62.0 in | Wt 162.0 lb

## 2022-10-19 DIAGNOSIS — E6609 Other obesity due to excess calories: Secondary | ICD-10-CM

## 2022-10-19 DIAGNOSIS — E039 Hypothyroidism, unspecified: Secondary | ICD-10-CM | POA: Diagnosis not present

## 2022-10-19 DIAGNOSIS — E538 Deficiency of other specified B group vitamins: Secondary | ICD-10-CM

## 2022-10-19 DIAGNOSIS — I10 Essential (primary) hypertension: Secondary | ICD-10-CM | POA: Diagnosis not present

## 2022-10-19 DIAGNOSIS — Z0000 Encounter for general adult medical examination without abnormal findings: Secondary | ICD-10-CM | POA: Diagnosis not present

## 2022-10-19 DIAGNOSIS — E785 Hyperlipidemia, unspecified: Secondary | ICD-10-CM

## 2022-10-19 DIAGNOSIS — E1169 Type 2 diabetes mellitus with other specified complication: Secondary | ICD-10-CM

## 2022-10-19 DIAGNOSIS — E669 Obesity, unspecified: Secondary | ICD-10-CM

## 2022-10-19 DIAGNOSIS — M8580 Other specified disorders of bone density and structure, unspecified site: Secondary | ICD-10-CM

## 2022-10-19 DIAGNOSIS — R252 Cramp and spasm: Secondary | ICD-10-CM

## 2022-10-19 MED ORDER — SEMAGLUTIDE (2 MG/DOSE) 8 MG/3ML ~~LOC~~ SOPN: 2.0000 mg | PEN_INJECTOR | SUBCUTANEOUS | 5 refills | Status: DC

## 2022-10-19 NOTE — Progress Notes (Signed)
Subjective:   By signing my name below, I, Penni Homans MD, attest that this documentation has been prepared under the direction and in the presence of Penni Homans MD, 10/19/2022.   Patient ID: Tiffany Mckenzie, female    DOB: 03/19/1956, 66 y.o.   MRN: [id number]  No chief complaint on file.   HPI Patient is in today for a comprehensive physical exam.  She reports no recent febrile illness or hospitalizations. Denies CP/ palp/ SOB/ HA/ congestion/fevers/GI or GU c/o. Taking meds as prescribed.    She denies having any fever, ear pain, new muscle pain, joint pain, new moles, congestion, sinus pain, sore throat, chest pain, palpations, wheezing, n/v/d, constipation, blood in stool, dysuria, frequency, hematuria at this time.  Ozempic Patient reports that she is more sensitive to the cold since she started Ozempic. She is requesting a refill on this medication and reports no adverse side effects.  Colonoscopy last completed on 03/26/2021. Dexa last completed on 10/04/2020. Patient was diagnosed with osteopenia at this visit. Immunizations- She is not interested in receiving an influenza vaccine this visit. Pap smear last completed on 12/16/2010. Mammogram last completed on 07/28/2022. Exercise- She reports that she does not participate in regular exercise.  Vision- She is UTD on vision care. Dental- She is UTD on dental care.   Health Maintenance Due  Topic Date Due   Medicare Annual Wellness (AWV)  Never done   COVID-19 Vaccine (1) Never done   Zoster Vaccines- Shingrix (1 of 2) Never done   FOOT EXAM  03/10/2017   Pneumonia Vaccine 69+ Years old (2 - PPSV23 or PCV20) 04/07/2019   OPHTHALMOLOGY EXAM  10/08/2021    Past Medical History:  Diagnosis Date   Allergic rhinitis    Allergic state 03/03/2015   Annual physical exam 02/09/2012   Anxiety state 09/30/2016   Arthritis    Asthma    Constipation 03/03/2015   Diabetes mellitus type 2 in obese ([place]) 09/13/2012   History of  chicken pox    History of hepatitis    age 63   Hyperglycemia 09/13/2012   Hypertension    Hypothyroidism    Left carotid bruit 03/11/2016   Obesity 03/28/2017   Overactive bladder 03/17/2013   Seeing urology   Raynaud phenomenon 03/03/2015   Type II or unspecified type diabetes mellitus without mention of complication, uncontrolled 09/13/2012    Past Surgical History:  Procedure Laterality Date   ABDOMINAL HYSTERECTOMY  2002   TONSILLECTOMY      Family History  Problem Relation Age of Onset   Alcohol abuse Father    Cancer Father        lung   Diabetes Father    Arthritis Mother    Heart disease Mother        Blockage & double bypass   Hypertension Mother    Diabetes Mother    Colon cancer Paternal Grandmother    Stroke Paternal Grandmother        maternal grandfather   Breast cancer Maternal Grandmother    Heart disease Maternal Grandfather    Scoliosis Daughter    Cancer Paternal Grandfather        lung    Social History   Socioeconomic History   Marital status: Married    Spouse name: Not on file   Number of children: Not on file   Years of education: Not on file   Highest education level: Not on file  Occupational History   Not on file  Tobacco Use   Smoking status: Never   Smokeless tobacco: Never  Substance and Sexual Activity   Alcohol use: Yes    Comment: less than once a month, white wine or mixed drink   Drug use: No   Sexual activity: Not on file  Other Topics Concern   Not on file  Social History Narrative   Not on file   Social Determinants of Health   Financial Resource Strain: Not on file  Food Insecurity: Not on file  Transportation Needs: Not on file  Physical Activity: Not on file  Stress: Not on file  Social Connections: Not on file  Intimate Partner Violence: Not on file    Outpatient Medications Prior to Visit  Medication Sig Dispense Refill   cephALEXin (KEFLEX) 500 MG capsule Take 1 capsule (500 mg total) by mouth 2 (two)  times daily. 14 capsule 0   fluticasone (FLOVENT HFA) 110 MCG/ACT inhaler Inhale 2 puffs into the lungs in the morning and at bedtime. Rinse mouth out after use. 1 each 1   HYDROcodone bit-homatropine (HYDROMET) 5-1.5 MG/5ML syrup Take 5 mLs by mouth every 6 (six) hours as needed for cough. 160 mL 0   levothyroxine (SYNTHROID) 100 MCG tablet Take one tablet daily before breakfast 6 days a week. 90 tablet 1   loratadine (CLARITIN) 10 MG tablet Take 10 mg by mouth daily.     LORazepam (ATIVAN) 0.5 MG tablet TAKE 1 TABLET BY MOUTH 2 TIMES DAILY AS NEEDED. 60 tablet 2   metFORMIN (GLUCOPHAGE) 500 MG tablet Take 1 tablet (500 mg total) by mouth 2 (two) times daily with a meal. 90 tablet 1   olmesartan (BENICAR) 20 MG tablet Take 0.5 tablets (10 mg total) by mouth daily. 45 tablet 1   Semaglutide, 1 MG/DOSE, (OZEMPIC, 1 MG/DOSE,) 4 MG/3ML SOPN INJECT 1 MG ONCE A WEEK AS DIRECTED 3 mL 3   triamcinolone cream (KENALOG) 0.1 % Apply 1 Application topically 2 (two) times daily as needed (rash). 80 g 1   No facility-administered medications prior to visit.    Allergies  Allergen Reactions   Codeine Nausea And Vomiting    vomiting   Clarithromycin Rash    Review of Systems  Constitutional:  Negative for fever.  HENT:  Negative for congestion, ear pain, sinus pain and sore throat.   Respiratory:  Negative for shortness of breath and wheezing.   Cardiovascular:  Negative for chest pain and palpitations.  Gastrointestinal:  Negative for blood in stool, constipation, diarrhea, nausea and vomiting.  Genitourinary:  Negative for dysuria, frequency and hematuria.  Musculoskeletal:  Negative for joint pain and myalgias.  Neurological:  Negative for headaches.       Objective:    Physical Exam Constitutional:      General: She is not in acute distress.    Appearance: Normal appearance. She is not ill-appearing.  HENT:     Head: Normocephalic and atraumatic.     Right Ear: Tympanic membrane, ear  canal and external ear normal.     Left Ear: Tympanic membrane, ear canal and external ear normal.  Eyes:     Extraocular Movements: Extraocular movements intact.     Pupils: Pupils are equal, round, and reactive to light.  Cardiovascular:     Rate and Rhythm: Normal rate and regular rhythm.     Heart sounds: Normal heart sounds. No murmur heard.    No gallop.  Pulmonary:     Effort: Pulmonary effort is normal. No respiratory  distress.     Breath sounds: Normal breath sounds. No wheezing or rales.  Abdominal:     General: Bowel sounds are normal. There is no distension.     Palpations: Abdomen is soft.     Tenderness: There is no abdominal tenderness. There is no guarding.  Skin:    General: Skin is warm and dry.  Neurological:     Mental Status: She is alert and oriented to person, place, and time.  Psychiatric:        Judgment: Judgment normal.     There were no vitals taken for this visit. Wt Readings from Last 3 Encounters:  08/03/22 162 lb 12.8 oz (73.8 kg)  04/16/22 175 lb 12.8 oz (79.7 kg)  03/25/21 168 lb (76.2 kg)       Assessment & Plan:   Problem List Items Addressed This Visit   None  No orders of the defined types were placed in this encounter.   Cranston Neighbor MD, personally preformed the services described in this documentation.  All medical record entries made by the scribe were at my direction and in my presence.  I have reviewed the chart and discharge instructions (if applicable) and agree that the record reflects my personal performance and is accurate and complete. 10/19/2022.   I,Verona Buck,acting as a Education administrator for Penni Homans, MD.,have documented all relevant documentation on the behalf of Penni Homans, MD,as directed by  Penni Homans, MD while in the presence of Penni Homans, MD.    Luna Glasgow

## 2022-10-19 NOTE — Patient Instructions (Addendum)
RSV (respiratory syncitial virus) vaccine at pharmacy, Arexvy Covid booster new version at pharmacy High dose flu shot  Shingrix is the new shingles shot, 2 shots over 2-6 months, confirm coverage with insurance and document, then can return here for shots with nurse appt or at [facility] 65 Years and Older, Female Preventive care refers to lifestyle choices and visits with your health care provider that can promote health and wellness. Preventive care visits are also called wellness exams. What can I expect for my preventive care visit? Counseling Your health care provider may ask you questions about your: Medical history, including: Past medical problems. Family medical history. Pregnancy and menstrual history. History of falls. Current health, including: Memory and ability to understand (cognition). Emotional well-being. Home life and relationship well-being. Sexual activity and sexual health. Lifestyle, including: Alcohol, nicotine or tobacco, and drug use. Access to firearms. Diet, exercise, and sleep habits. Work and work Statistician. Sunscreen use. Safety issues such as seatbelt and bike helmet use. Physical exam Your health care provider will check your: Height and weight. These may be used to calculate your BMI (body mass index). BMI is a measurement that tells if you are at a healthy weight. Waist circumference. This measures the distance around your waistline. This measurement also tells if you are at a healthy weight and may help predict your risk of certain diseases, such as type 2 diabetes and high blood pressure. Heart rate and blood pressure. Body temperature. Skin for abnormal spots. What immunizations do I need?  Vaccines are usually given at various ages, according to a schedule. Your health care provider will recommend vaccines for you based on your age, medical history, and lifestyle or other factors, such as travel or where you work. What  tests do I need? Screening Your health care provider may recommend screening tests for certain conditions. This may include: Lipid and cholesterol levels. Hepatitis C test. Hepatitis B test. HIV (human immunodeficiency virus) test. STI (sexually transmitted infection) testing, if you are at risk. Lung cancer screening. Colorectal cancer screening. Diabetes screening. This is done by checking your blood sugar (glucose) after you have not eaten for a while (fasting). Mammogram. Talk with your health care provider about how often you should have regular mammograms. BRCA-related cancer screening. This may be done if you have a family history of breast, ovarian, tubal, or peritoneal cancers. Bone density scan. This is done to screen for osteoporosis. Talk with your health care provider about your test results, treatment options, and if necessary, the need for more tests. Follow these instructions at home: Eating and drinking  Eat a diet that includes fresh fruits and vegetables, whole grains, lean protein, and low-fat dairy products. Limit your intake of foods with high amounts of sugar, saturated fats, and salt. Take vitamin and mineral supplements as recommended by your health care provider. Do not drink alcohol if your health care provider tells you not to drink. If you drink alcohol: Limit how much you have to 0-1 drink a day. Know how much alcohol is in your drink. In the [place] one drink equals one 12 oz bottle of beer (355 mL), one 5 oz glass of wine (148 mL), or one 1 oz glass of hard liquor (44 mL). Lifestyle Brush your teeth every morning and night with fluoride toothpaste. Floss one time each day. Exercise for at least 30 minutes 5 or more days each week. Do not use any products that contain nicotine or tobacco. These products include  cigarettes, chewing tobacco, and vaping devices, such as e-cigarettes. If you need help quitting, ask your health care provider. Do not use drugs. If  you are sexually active, practice safe sex. Use a condom or other form of protection in order to prevent STIs. Take aspirin only as told by your health care provider. Make sure that you understand how much to take and what form to take. Work with your health care provider to find out whether it is safe and beneficial for you to take aspirin daily. Ask your health care provider if you need to take a cholesterol-lowering medicine (statin). Find healthy ways to manage stress, such as: Meditation, yoga, or listening to music. Journaling. Talking to a trusted person. Spending time with friends and family. Minimize exposure to UV radiation to reduce your risk of skin cancer. Safety Always wear your seat belt while driving or riding in a vehicle. Do not drive: If you have been drinking alcohol. Do not ride with someone who has been drinking. When you are tired or distracted. While texting. If you have been using any mind-altering substances or drugs. Wear a helmet and other protective equipment during sports activities. If you have firearms in your house, make sure you follow all gun safety procedures. What's next? Visit your health care provider once a year for an annual wellness visit. Ask your health care provider how often you should have your eyes and teeth checked. Stay up to date on all vaccines. This information is not intended to replace advice given to you by your health care provider. Make sure you discuss any questions you have with your health care provider. Document Revised: 05/28/2021 Document Reviewed: 05/28/2021 Elsevier Patient Education  [place].

## 2022-10-20 LAB — TSH: TSH: 1.12 u[IU]/mL (ref 0.35–5.50)

## 2022-10-21 DIAGNOSIS — M8580 Other specified disorders of bone density and structure, unspecified site: Secondary | ICD-10-CM | POA: Insufficient documentation

## 2022-10-21 NOTE — Assessment & Plan Note (Signed)
On Levothyroxine, continue to monitor 

## 2022-10-21 NOTE — Assessment & Plan Note (Signed)
Encouraged DASH or MIND diet, decrease po intake and increase exercise as tolerated. Needs 7-8 hours of sleep nightly. Avoid trans fats, eat small, frequent meals every 4-5 hours with lean proteins, complex carbs and healthy fats. Minimize simple carbs, high fat foods and processed foods 

## 2022-10-21 NOTE — Assessment & Plan Note (Signed)
>>  ASSESSMENT AND PLAN FOR TYPE 2 DIABETES MELLITUS WITH OBESITY WRITTEN ON 10/21/2022 10:31 PM BY BLYTH, STACEY A, MD  hgba1c acceptable, minimize simple carbs. Increase exercise as tolerated. Continue current meds is tolerating Ozempic  and is given refilll

## 2022-10-21 NOTE — Assessment & Plan Note (Signed)
Hydrate and monitor 

## 2022-10-21 NOTE — Assessment & Plan Note (Addendum)
hgba1c acceptable, minimize simple carbs. Increase exercise as tolerated. Continue current meds is tolerating Ozempic and is given refilll

## 2022-10-21 NOTE — Assessment & Plan Note (Addendum)
Patient encouraged to maintain heart healthy diet, regular exercise, adequate sleep. Consider daily probiotics. Take medications as prescribed. Labs ordered and reviewed  Colonoscopy in 2022 repeat in 2032 Pap will request from OB Mgm will request from OB Dexa 2021 repeat in 2024 or 25 RSV (respiratory syncitial virus) vaccine at pharmacy Covid booster when new version out late September At pharmacy High dose flu shot mid Sept to mid Oct

## 2022-10-21 NOTE — Assessment & Plan Note (Signed)

## 2022-10-21 NOTE — Assessment & Plan Note (Signed)
Well controlled, no changes to meds. Encouraged heart healthy diet such as the DASH diet and exercise as tolerated.  °

## 2022-10-21 NOTE — Assessment & Plan Note (Signed)
Supplement and monitor 

## 2022-10-21 NOTE — Assessment & Plan Note (Signed)
Encourage heart healthy diet such as MIND or DASH diet, increase exercise, avoid trans fats, simple carbohydrates and processed foods, consider a krill or fish or flaxseed oil cap daily.  °

## 2022-11-26 ENCOUNTER — Other Ambulatory Visit: Payer: Self-pay

## 2022-11-26 ENCOUNTER — Encounter: Payer: Self-pay | Admitting: Family Medicine

## 2022-11-27 ENCOUNTER — Other Ambulatory Visit: Payer: Self-pay | Admitting: Family Medicine

## 2022-11-27 MED ORDER — SEMAGLUTIDE(0.25 OR 0.5MG/DOS) 2 MG/3ML ~~LOC~~ SOPN: 1.0000 mg | PEN_INJECTOR | SUBCUTANEOUS | 1 refills | Status: DC

## 2022-11-30 ENCOUNTER — Telehealth: Payer: Self-pay

## 2022-11-30 NOTE — Telephone Encounter (Signed)
PA cancelled by plan.   [id number] case has been cancelled for OZEMPIC INJ 2MG /3ML, use as directed, for the following reason: Prior authorization is not required at this time. If there are any questions regarding the processing of the prescription, please contact [facility] at [phone]. The member may contact Member Services by calling the number on the back of their ID Card if additional information is needed. Reviewed by: [phone], R.Ph.

## 2022-11-30 NOTE — Telephone Encounter (Signed)
PA initiated via Covermymeds; KEY: [id number]. Awaiting determination.

## 2022-12-09 ENCOUNTER — Other Ambulatory Visit: Payer: Self-pay | Admitting: Family Medicine

## 2022-12-09 NOTE — Telephone Encounter (Signed)
Requesting: lorazepam 0.5mg   Contract: 09/23/2017 UDS: 06/12/2019 Last Visit: 10/19/22 Next Visit: 03/23/23 Last Refill: 04/16/22 #60 and 0RF   Please Advise

## 2022-12-17 ENCOUNTER — Telehealth: Payer: Commercial Managed Care - PPO | Admitting: Family Medicine

## 2022-12-17 DIAGNOSIS — R3989 Other symptoms and signs involving the genitourinary system: Secondary | ICD-10-CM | POA: Diagnosis not present

## 2022-12-17 MED ORDER — CEPHALEXIN 500 MG PO CAPS: 500.0000 mg | ORAL_CAPSULE | Freq: Two times a day (BID) | ORAL | 0 refills | Status: AC

## 2022-12-17 NOTE — Progress Notes (Signed)

## 2023-02-06 ENCOUNTER — Other Ambulatory Visit: Payer: Self-pay | Admitting: Family Medicine

## 2023-03-23 ENCOUNTER — Ambulatory Visit: Payer: Commercial Managed Care - PPO | Admitting: Family Medicine

## 2023-04-17 ENCOUNTER — Other Ambulatory Visit: Payer: Self-pay | Admitting: Family Medicine

## 2023-04-19 NOTE — Assessment & Plan Note (Signed)
On Levothyroxine, continue to monitor 

## 2023-04-19 NOTE — Assessment & Plan Note (Signed)
Hydrate and monitor 

## 2023-04-19 NOTE — Assessment & Plan Note (Signed)
>>  ASSESSMENT AND PLAN FOR TYPE 2 DIABETES MELLITUS WITH OBESITY WRITTEN ON 04/19/2023 10:56 PM BY BLYTH, STACEY A, MD  hgba1c acceptable, minimize simple carbs. Increase exercise as tolerated. Continue current meds is tolerating Ozempic  and is given refilll

## 2023-04-19 NOTE — Assessment & Plan Note (Signed)
Supplement and monitor 

## 2023-04-19 NOTE — Assessment & Plan Note (Signed)

## 2023-04-19 NOTE — Assessment & Plan Note (Signed)
hgba1c acceptable, minimize simple carbs. Increase exercise as tolerated. Continue current meds is tolerating Ozempic and is given refilll 

## 2023-04-19 NOTE — Assessment & Plan Note (Signed)
Encourage heart healthy diet such as MIND or DASH diet, increase exercise, avoid trans fats, simple carbohydrates and processed foods, consider a krill or fish or flaxseed oil cap daily.  °

## 2023-04-20 ENCOUNTER — Ambulatory Visit (INDEPENDENT_AMBULATORY_CARE_PROVIDER_SITE_OTHER): Payer: Medicare HMO | Admitting: Family Medicine

## 2023-04-20 VITALS — BP 118/68 | HR 66 | Temp 98.0°F | Resp 16 | Ht 62.0 in | Wt 151.2 lb

## 2023-04-20 DIAGNOSIS — E785 Hyperlipidemia, unspecified: Secondary | ICD-10-CM

## 2023-04-20 DIAGNOSIS — E1169 Type 2 diabetes mellitus with other specified complication: Secondary | ICD-10-CM

## 2023-04-20 DIAGNOSIS — Z79899 Other long term (current) drug therapy: Secondary | ICD-10-CM | POA: Diagnosis not present

## 2023-04-20 DIAGNOSIS — E039 Hypothyroidism, unspecified: Secondary | ICD-10-CM

## 2023-04-20 DIAGNOSIS — M8580 Other specified disorders of bone density and structure, unspecified site: Secondary | ICD-10-CM | POA: Diagnosis not present

## 2023-04-20 DIAGNOSIS — E538 Deficiency of other specified B group vitamins: Secondary | ICD-10-CM | POA: Diagnosis not present

## 2023-04-20 DIAGNOSIS — R252 Cramp and spasm: Secondary | ICD-10-CM

## 2023-04-20 DIAGNOSIS — R059 Cough, unspecified: Secondary | ICD-10-CM

## 2023-04-20 DIAGNOSIS — E669 Obesity, unspecified: Secondary | ICD-10-CM | POA: Diagnosis not present

## 2023-04-20 DIAGNOSIS — Z7985 Long-term (current) use of injectable non-insulin antidiabetic drugs: Secondary | ICD-10-CM | POA: Diagnosis not present

## 2023-04-20 LAB — CBC WITH DIFFERENTIAL/PLATELET
Basophils Absolute: 0.1 10*3/uL (ref 0.0–0.1)
Basophils Relative: 0.7 % (ref 0.0–3.0)
Eosinophils Absolute: 0.1 10*3/uL (ref 0.0–0.7)
Eosinophils Relative: 2 % (ref 0.0–5.0)
HCT: 37.1 % (ref 36.0–46.0)
Hemoglobin: 12.8 g/dL (ref 12.0–15.0)
Lymphocytes Relative: 38.3 % (ref 12.0–46.0)
Lymphs Abs: 2.8 10*3/uL (ref 0.7–4.0)
MCHC: 34.5 g/dL (ref 30.0–36.0)
MCV: 91.3 fl (ref 78.0–100.0)
Monocytes Absolute: 0.6 10*3/uL (ref 0.1–1.0)
Monocytes Relative: 8.6 % (ref 3.0–12.0)
Neutro Abs: 3.6 10*3/uL (ref 1.4–7.7)
Neutrophils Relative %: 50.4 % (ref 43.0–77.0)
Platelets: 217 10*3/uL (ref 150.0–400.0)
RBC: 4.06 Mil/uL (ref 3.87–5.11)
RDW: 14.1 % (ref 11.5–15.5)
WBC: 7.2 10*3/uL (ref 4.0–10.5)

## 2023-04-20 LAB — LIPID PANEL
Cholesterol: 123 mg/dL (ref 0–200)
HDL: 45.7 mg/dL (ref >39.00)
LDL Cholesterol: 62 mg/dL (ref 0–99)
NonHDL: 77.63
Total CHOL/HDL Ratio: 3
Triglycerides: 76 mg/dL (ref 0.0–149.0)
VLDL: 15.2 mg/dL (ref 0.0–40.0)

## 2023-04-20 LAB — TSH: TSH: 1.29 u[IU]/mL (ref 0.35–5.50)

## 2023-04-20 LAB — COMPREHENSIVE METABOLIC PANEL
ALT: 12 U/L (ref 0–35)
AST: 14 U/L (ref 0–37)
Albumin: 3.8 g/dL (ref 3.5–5.2)
Alkaline Phosphatase: 93 U/L (ref 39–117)
BUN: 16 mg/dL (ref 6–23)
CO2: 30 mEq/L (ref 19–32)
Calcium: 8.9 mg/dL (ref 8.4–10.5)
Chloride: 103 mEq/L (ref 96–112)
Creatinine, Ser: 0.72 mg/dL (ref 0.40–1.20)
GFR: 85.54 mL/min (ref >60.00)
Glucose, Bld: 79 mg/dL (ref 70–99)
Potassium: 3.9 mEq/L (ref 3.5–5.1)
Sodium: 140 mEq/L (ref 135–145)
Total Bilirubin: 0.7 mg/dL (ref 0.2–1.2)
Total Protein: 6.2 g/dL (ref 6.0–8.3)

## 2023-04-20 LAB — HEMOGLOBIN A1C: Hgb A1c MFr Bld: 5.8 % (ref 4.6–6.5)

## 2023-04-20 NOTE — Patient Instructions (Addendum)
Bone density shows osteopenia, which is thinner than normal but not as bad as osteoporosis. Recommend calcium intake of 1200 to 1500 mg daily, divided into roughly 3 doses. Best source is the diet and a single dairy serving is about 500 mg, a supplement of calcium citrate once or twice daily to balance diet is fine if not getting enough in diet. Also need Vitamin D 2000 IU caps, 1 cap daily if not already taking vitamin D. Also recommend weight baring exercise on hips and upper body to keep bones strong   Prevnar 20 at pharmacy Tetanus in 2025 unless injured Shingrix is the new shingles shot, 2 shots over 2-6 months, confirm coverage with insurance and document, then can return here for shots with nurse appt or at pharmacy September 2024 or later RSV, Respiratory Syncitial Virus vaccine, , Arexvy vaccine at pharmacy   Cough, Adult Coughing is a reflex that clears your throat and airways (respiratory system). It helps heal and protect your lungs. It is normal to cough from time to time. A cough that happens with other symptoms or that lasts a long time may be a sign of a condition that needs treatment. A short-term (acute) cough may only last 2-3 weeks. A long-term (chronic) cough may last 8 or more weeks. Coughing is often caused by: Diseases, such as: An infection of the respiratory system. Asthma or other heart or lung diseases. Gastroesophageal reflux. This is when acid comes back up from the stomach. Breathing in things that irritate your lungs. Allergies. Postnasal drip. This is when mucus runs down the back of your throat. Smoking. Some medicines. Follow these instructions at home: Medicines Take over-the-counter and prescription medicines only as told by your health care provider. Talk with your provider before you take cough medicine (cough suppressants). Eating and drinking Do not drink alcohol. Avoid caffeine. Drink enough fluid to keep your pee (urine) pale  yellow. Lifestyle Avoid cigarette smoke. Do not use any products that contain nicotine or tobacco. These products include cigarettes, chewing tobacco, and vaping devices, such as e-cigarettes. If you need help quitting, ask your provider. Avoid things that make you cough. These may include perfumes, candles, cleaning products, or campfire smoke. General instructions  Watch for any changes to your cough. Tell your provider about them. Always cover your mouth when you cough. If the air is dry in your bedroom or home, use a cool mist vaporizer or humidifier. If your cough is worse at night, try to sleep in a semi-upright position. Rest as needed. Contact a health care provider if: You have new symptoms, or your symptoms get worse. You cough up pus. You have a fever that does not go away or a cough that does not get better after 2-3 weeks. You cannot control your cough with medicine, and you are losing sleep. You have pain that gets worse or is not helped with medicine. You lose weight for no clear reason. You have night sweats. Get help right away if: You cough up blood. You have trouble breathing. Your heart is beating very fast. These symptoms may be an emergency. Get help right away. Call 911. Do not wait to see if the symptoms will go away. Do not drive yourself to the hospital. This information is not intended to replace advice given to you by your health care provider. Make sure you discuss any questions you have with your health care provider. Document Revised: 07/31/2022 Document Reviewed: 07/31/2022 Elsevier Patient Education  2023 [employer].

## 2023-04-20 NOTE — Progress Notes (Signed)
Subjective:   By signing my name below, I, Tiffany Mckenzie, attest that this documentation has been prepared under the direction and in the presence of Tiffany Canary, MD., 04/20/2023.   Patient ID: Tiffany Mckenzie, female    DOB: [birth year], 67 y.o.   MRN: [id number]  Chief Complaint  Patient presents with   Follow-up    Follow up   HPI Patient is in today for an office visit.  Constipation/Diarrhea Patient reports that she has been experiencing both constipation and diarrhea intermittently. She has been managing these symptoms with Benefiber and Miralax and states these are due to [facility]. Body mass index is 27.65 kg/m. Today she denies CP/palpitations/SOB/HA/fever/chills/GI or GU symptoms. Lab Results  Component Value Date   HGBA1C 5.8 04/20/2023   Wt Readings from Last 3 Encounters:  04/20/23 151 lb 3.2 oz (68.6 kg)  10/19/22 162 lb (73.5 kg)  08/03/22 162 lb 12.8 oz (73.8 kg)   Persistent Cough Patient continues to experience mild persistent cough and is requesting a refill on Hydromet to use as needed.  Shingles Patient was seen by ophthalmologist Dr. Carolee Rota on 02/25/2023 due to Shingles and associated right eye itching.  She was started on a 14 day regimen of Valtrex and 7 day regimen of Maxitrol ointment. She has not received any Shingles vaccinations but has been encouraged to consider these in or after 08/2023. Today she denies eye pain, itchiness, or blurred vision.  Past Medical History:  Diagnosis Date   Allergic rhinitis    Allergic state 03/03/2015   Annual physical exam 02/09/2012   Anxiety state 09/30/2016   Arthritis    Asthma    Constipation 03/03/2015   Diabetes mellitus type 2 in obese (HCC) 09/13/2012   History of chicken pox    History of hepatitis    age 51   Hyperglycemia 09/13/2012   Hypertension    Hypothyroidism    Left carotid bruit 03/11/2016   Obesity 03/28/2017   Overactive bladder 03/17/2013   Seeing urology   Raynaud phenomenon  03/03/2015   Type II or unspecified type diabetes mellitus without mention of complication, uncontrolled 09/13/2012    Past Surgical History:  Procedure Laterality Date   ABDOMINAL HYSTERECTOMY  2002   TONSILLECTOMY      Family History  Problem Relation Age of Onset   Alcohol abuse Father    Cancer Father        lung   Diabetes Father    Arthritis Mother    Heart disease Mother        Blockage & double bypass   Hypertension Mother    Diabetes Mother    Colon cancer Paternal Grandmother    Stroke Paternal Grandmother        maternal grandfather   Breast cancer Maternal Grandmother    Heart disease Maternal Grandfather    Scoliosis Daughter    Cancer Paternal Grandfather        lung    Social History   Socioeconomic History   Marital status: Married    Spouse name: Not on file   Number of children: Not on file   Years of education: Not on file   Highest education level: Not on file  Occupational History   Not on file  Tobacco Use   Smoking status: Never   Smokeless tobacco: Never  Substance and Sexual Activity   Alcohol use: Yes    Comment: less than once a month, white wine or mixed drink  Drug use: No   Sexual activity: Not on file  Other Topics Concern   Not on file  Social History Narrative   Not on file   Social Determinants of Health   Financial Resource Strain: Not on file  Food Insecurity: Not on file  Transportation Needs: Not on file  Physical Activity: Not on file  Stress: Not on file  Social Connections: Not on file  Intimate Partner Violence: Not on file    Outpatient Medications Prior to Visit  Medication Sig Dispense Refill   fluticasone (FLOVENT HFA) 110 MCG/ACT inhaler Inhale 2 puffs into the lungs in the morning and at bedtime. Rinse mouth out after use. 1 each 1   HYDROcodone bit-homatropine (HYDROMET) 5-1.5 MG/5ML syrup Take 5 mLs by mouth every 6 (six) hours as needed for cough. 160 mL 0   levothyroxine (SYNTHROID) 100 MCG tablet  TAKE ONE TABLET DAILY BEFORE BREAKFAST 6 DAYS A WEEK. 90 tablet 1   loratadine (CLARITIN) 10 MG tablet Take 10 mg by mouth daily.     LORazepam (ATIVAN) 0.5 MG tablet TAKE 1 TABLET BY MOUTH TWICE A DAY AS NEEDED 60 tablet 1   metFORMIN (GLUCOPHAGE) 500 MG tablet Take 1 tablet (500 mg total) by mouth 2 (two) times daily with a meal. 90 tablet 1   olmesartan (BENICAR) 20 MG tablet Take 0.5 tablets (10 mg total) by mouth daily. 45 tablet 1   Semaglutide, 2 MG/DOSE, 8 MG/3ML SOPN Inject 2 mg as directed once a week. 3 mL 5   Semaglutide,0.25 or 0.5MG /DOS, 2 MG/3ML SOPN Inject 1 mg into the skin once a week. 6 mL 1   triamcinolone cream (KENALOG) 0.1 % Apply 1 Application topically 2 (two) times daily as needed (rash). 80 g 1   No facility-administered medications prior to visit.    Allergies  Allergen Reactions   Codeine Nausea And Vomiting    vomiting   Clarithromycin Rash    Review of Systems  Constitutional:  Negative for chills and fever.  Eyes:  Negative for blurred vision and pain.  Respiratory:  Negative for shortness of breath.   Cardiovascular:  Negative for chest pain and palpitations.  Gastrointestinal:  Negative for abdominal pain, blood in stool, constipation, diarrhea, nausea and vomiting.  Genitourinary:  Negative for dysuria, frequency, hematuria and urgency.  Skin:           Neurological:  Negative for headaches.       Objective:    Physical Exam Constitutional:      General: She is not in acute distress.    Appearance: Normal appearance. She is not ill-appearing.  HENT:     Head: Normocephalic and atraumatic.     Right Ear: External ear normal.     Left Ear: External ear normal.     Nose: Nose normal.     Mouth/Throat:     Mouth: Mucous membranes are moist.     Pharynx: Oropharynx is clear.  Eyes:     General:        Right eye: No discharge.        Left eye: No discharge.     Extraocular Movements: Extraocular movements intact.     Conjunctiva/sclera:  Conjunctivae normal.     Pupils: Pupils are equal, round, and reactive to light.  Cardiovascular:     Rate and Rhythm: Normal rate and regular rhythm.     Pulses: Normal pulses.     Heart sounds: Normal heart sounds. No murmur heard.  No gallop.  Pulmonary:     Effort: Pulmonary effort is normal. No respiratory distress.     Breath sounds: Normal breath sounds. No wheezing or rales.  Abdominal:     General: Bowel sounds are normal.     Palpations: Abdomen is soft.     Tenderness: There is no abdominal tenderness. There is no guarding.  Musculoskeletal:        General: Normal range of motion.     Cervical back: Normal range of motion.     Right lower leg: No edema.     Left lower leg: No edema.  Skin:    General: Skin is warm and dry.  Neurological:     Mental Status: She is alert and oriented to person, place, and time.  Psychiatric:        Mood and Affect: Mood normal.        Behavior: Behavior normal.        Judgment: Judgment normal.     BP 118/68 (BP Location: Right Arm, Patient Position: Sitting, Cuff Size: Normal)   Pulse 66   Temp 98 F (36.7 C) (Oral)   Resp 16   Ht 5\' 2"  (1.575 m)   Wt 151 lb 3.2 oz (68.6 kg)   SpO2 99%   BMI 27.65 kg/m  Wt Readings from Last 3 Encounters:  04/20/23 151 lb 3.2 oz (68.6 kg)  10/19/22 162 lb (73.5 kg)  08/03/22 162 lb 12.8 oz (73.8 kg)    Diabetic Foot Exam - Simple   No data filed    Lab Results  Component Value Date   WBC 7.2 04/20/2023   HGB 12.8 04/20/2023   HCT 37.1 04/20/2023   PLT 217.0 04/20/2023   GLUCOSE 79 04/20/2023   CHOL 123 04/20/2023   TRIG 76.0 04/20/2023   HDL 45.70 04/20/2023   LDLCALC 62 04/20/2023   ALT 12 04/20/2023   AST 14 04/20/2023   NA 140 04/20/2023   K 3.9 04/20/2023   CL 103 04/20/2023   CREATININE 0.72 04/20/2023   BUN 16 04/20/2023   CO2 30 04/20/2023   TSH 1.29 04/20/2023   HGBA1C 5.8 04/20/2023   MICROALBUR <0.7 08/03/2022    Lab Results  Component Value Date   TSH  1.29 04/20/2023   Lab Results  Component Value Date   WBC 7.2 04/20/2023   HGB 12.8 04/20/2023   HCT 37.1 04/20/2023   MCV 91.3 04/20/2023   PLT 217.0 04/20/2023   Lab Results  Component Value Date   NA 140 04/20/2023   K 3.9 04/20/2023   CO2 30 04/20/2023   GLUCOSE 79 04/20/2023   BUN 16 04/20/2023   CREATININE 0.72 04/20/2023   BILITOT 0.7 04/20/2023   ALKPHOS 93 04/20/2023   AST 14 04/20/2023   ALT 12 04/20/2023   PROT 6.2 04/20/2023   ALBUMIN 3.8 04/20/2023   CALCIUM 8.9 04/20/2023   GFR 85.54 04/20/2023   Lab Results  Component Value Date   CHOL 123 04/20/2023   Lab Results  Component Value Date   HDL 45.70 04/20/2023   Lab Results  Component Value Date   LDLCALC 62 04/20/2023   Lab Results  Component Value Date   TRIG 76.0 04/20/2023   Lab Results  Component Value Date   CHOLHDL 3 04/20/2023   Lab Results  Component Value Date   HGBA1C 5.8 04/20/2023      Assessment & Plan:  Healthy Lifestyle: Encouraged 6-8 hours of sleep, heart healthy diet, 60-80 oz  of non-alcohol/non-caffeinated fluids, and minimum of 4000 steps daily. Encouraged increased calcium intake.  Immunizations: Encouraged patient to consider annual COVID-19/Influenza vaccinations as well as Prevnar-20 and RSV vaccinations. Shingles vaccination in 08/2023 or later. Tetanus vaccination in 2025 or earlier if injured.   Labs: Routine blood work ordered today.  Constipation/Diarrhea: Recommended combination of Benefiber/Metamucil with Miralax to manage constipation and diarrhea.  Persistent Cough: Hydromet 5-1.5 mg/5 mL refilled today. Problem List Items Addressed This Visit     B12 deficiency - Primary    Supplement and monitor       Relevant Orders   CBC with Differential/Platelet (Completed)   Hyperlipidemia associated with type 2 diabetes mellitus (HCC)    Encourage heart healthy diet such as MIND or DASH diet, increase exercise, avoid trans fats, simple carbohydrates and  processed foods, consider a krill or fish or flaxseed oil cap daily.        Relevant Orders   Comprehensive metabolic panel (Completed)   Lipid panel (Completed)   Hypothyroidism    On Levothyroxine, continue to monitor       Relevant Orders   TSH (Completed)   Muscle cramps    Hydrate and monitor       Osteopenia    Bone density shows osteopenia, which is thinner than normal but not as bad as osteoporosis. Recommend calcium intake of 1200 to 1500 mg daily, divided into roughly 3 doses. Best source is the diet and a single dairy serving is about 500 mg, a supplement of calcium citrate once or twice daily to balance diet is fine if not getting enough in diet. Also need Vitamin D 2000 IU caps, 1 cap daily if not already taking vitamin D. Also recommend weight baring exercise on hips and upper body to keep bones strong       Type 2 diabetes mellitus with obesity (HCC)    hgba1c acceptable, minimize simple carbs. Increase exercise as tolerated. Continue current meds is tolerating Ozempic and is given refilll      Relevant Orders   Hemoglobin A1c (Completed)   Cough    Comes and goes and tends to flare in spring with allergies. Allowed refill on Hydromet      Other Visit Diagnoses     High risk medication use       Relevant Orders   Drug Monitoring Panel [phone] , Urine (Completed)      No orders of the defined types were placed in this encounter.  I, Danise Edge, MD, personally preformed the services described in this documentation.  All medical record entries made by the scribe were at my direction and in my presence.  I have reviewed the chart and discharge instructions (if applicable) and agree that the record reflects my personal performance and is accurate and complete. 04/20/2023  I,Mohammed Iqbal,acting as a scribe for Danise Edge, MD.,have documented all relevant documentation on the behalf of Danise Edge, MD,as directed by  Danise Edge, MD while in the presence of  Danise Edge, MD.  Danise Edge, MD

## 2023-04-21 DIAGNOSIS — N3281 Overactive bladder: Secondary | ICD-10-CM | POA: Diagnosis not present

## 2023-04-21 DIAGNOSIS — Z01419 Encounter for gynecological examination (general) (routine) without abnormal findings: Secondary | ICD-10-CM | POA: Diagnosis not present

## 2023-04-21 DIAGNOSIS — Z6827 Body mass index (BMI) 27.0-27.9, adult: Secondary | ICD-10-CM | POA: Diagnosis not present

## 2023-04-21 DIAGNOSIS — L900 Lichen sclerosus et atrophicus: Secondary | ICD-10-CM | POA: Diagnosis not present

## 2023-04-22 LAB — DRUG MONITORING PANEL 376104, URINE
Amphetamines: NEGATIVE ng/mL (ref <500)
Barbiturates: NEGATIVE ng/mL (ref <300)
Benzodiazepines: NEGATIVE ng/mL (ref <100)
Cocaine Metabolite: NEGATIVE ng/mL (ref <150)
Desmethyltramadol: NEGATIVE ng/mL (ref <100)
Opiates: NEGATIVE ng/mL (ref <100)
Oxycodone: NEGATIVE ng/mL (ref <100)
Tramadol: NEGATIVE ng/mL (ref <100)

## 2023-04-22 LAB — DM TEMPLATE

## 2023-04-23 NOTE — Assessment & Plan Note (Signed)
Comes and goes and tends to flare in spring with allergies. Allowed refill on Hydromet

## 2023-05-31 ENCOUNTER — Other Ambulatory Visit: Payer: Self-pay | Admitting: Family Medicine

## 2023-07-03 ENCOUNTER — Other Ambulatory Visit: Payer: Self-pay | Admitting: Family Medicine

## 2023-07-05 ENCOUNTER — Ambulatory Visit (INDEPENDENT_AMBULATORY_CARE_PROVIDER_SITE_OTHER): Payer: Medicare HMO | Admitting: *Deleted

## 2023-07-05 DIAGNOSIS — Z0000 Encounter for general adult medical examination without abnormal findings: Secondary | ICD-10-CM | POA: Diagnosis not present

## 2023-07-05 NOTE — Progress Notes (Signed)
Subjective:   Tiffany Mckenzie is a 67 y.o. female who presents for an Initial Medicare Annual Wellness Visit.  Visit Complete: Virtual  I connected with  Tiffany Mckenzie on 07/05/23 by a audio enabled telemedicine application and verified that I am speaking with the correct person using two identifiers.  Patient Location: Home  Provider Location: Office/Clinic  I discussed the limitations of evaluation and management by telemedicine. The patient expressed understanding and agreed to proceed.   Review of Systems     Cardiac Risk Factors include: advanced age (>71men, >76 women);diabetes mellitus;hypertension;dyslipidemia     Objective:    There were no vitals filed for this visit. There is no height or weight on file to calculate BMI.     07/05/2023   10:27 AM 03/25/2017   10:39 AM 12/04/2014    9:54 AM  Advanced Directives  Does Patient Have a Medical Advance Directive? Yes Yes No  Type of Estate agent of [place];Living will Healthcare Power of [place] Llor[place] Torres;Living will   Does patient want to make changes to medical advance directive?  No - Patient declined   Copy of Healthcare Power of Attorney in Chart? No - copy requested No - copy requested   Would patient like information on creating a medical advance directive?   No - patient declined information    Current Medications (verified) Outpatient Encounter Medications as of 07/05/2023  Medication Sig   fluticasone (FLOVENT HFA) 110 MCG/ACT inhaler Inhale 2 puffs into the lungs in the morning and at bedtime. Rinse mouth out after use.   levothyroxine (SYNTHROID) 100 MCG tablet TAKE ONE TABLET DAILY BEFORE BREAKFAST 6 DAYS A WEEK.   loratadine (CLARITIN) 10 MG tablet Take 10 mg by mouth daily.   LORazepam (ATIVAN) 0.5 MG tablet TAKE 1 TABLET BY MOUTH TWICE A DAY AS NEEDED   metFORMIN (GLUCOPHAGE) 500 MG tablet Take 1 tablet (500 mg total) by mouth 2 (two) times daily with a meal.   olmesartan (BENICAR) 20 MG  tablet Take 0.5 tablets (10 mg total) by mouth daily.   Semaglutide, 2 MG/DOSE, (OZEMPIC, 2 MG/DOSE,) 8 MG/3ML SOPN INJECT 2MG  AS DIRECTED ONCE A WEEK   Semaglutide,0.25 or 0.5MG /DOS, 2 MG/3ML SOPN Inject 1 mg into the skin once a week.   triamcinolone cream (KENALOG) 0.1 % Apply 1 Application topically 2 (two) times daily as needed (rash).   HYDROcodone bit-homatropine (HYDROMET) 5-1.5 MG/5ML syrup Take 5 mLs by mouth every 6 (six) hours as needed for cough. (Patient not taking: Reported on 07/05/2023)   No facility-administered encounter medications on file as of 07/05/2023.    Allergies (verified) Codeine and Clarithromycin   History: Past Medical History:  Diagnosis Date   Allergic rhinitis    Allergic state 03/03/2015   Annual physical exam 02/09/2012   Anxiety state 09/30/2016   Arthritis    Asthma    Constipation 03/03/2015   Diabetes mellitus type 2 in obese 09/13/2012   History of chicken pox    History of hepatitis    age 74   Hyperglycemia 09/13/2012   Hypertension    Hypothyroidism    Left carotid bruit 03/11/2016   Obesity 03/28/2017   Overactive bladder 03/17/2013   Seeing urology   Raynaud phenomenon 03/03/2015   Type II or unspecified type diabetes mellitus without mention of complication, uncontrolled 09/13/2012   Past Surgical History:  Procedure Laterality Date   ABDOMINAL HYSTERECTOMY  2002   TONSILLECTOMY     Family History  Problem  Relation Age of Onset   Alcohol abuse Father    Cancer Father        lung   Diabetes Father    Arthritis Mother    Heart disease Mother        Blockage & double bypass   Hypertension Mother    Diabetes Mother    Colon cancer Paternal Grandmother    Stroke Paternal Grandmother        maternal grandfather   Breast cancer Maternal Grandmother    Heart disease Maternal Grandfather    Scoliosis Daughter    Cancer Paternal Grandfather        lung   Social History   Socioeconomic History   Marital status: Married    Spouse  name: Not on file   Number of children: Not on file   Years of education: Not on file   Highest education level: Not on file  Occupational History   Not on file  Tobacco Use   Smoking status: Never   Smokeless tobacco: Never  Substance and Sexual Activity   Alcohol use: Yes    Comment: less than once a month, white wine or mixed drink   Drug use: No   Sexual activity: Not on file  Other Topics Concern   Not on file  Social History Narrative   Not on file   Social Determinants of Health   Financial Resource Strain: Low Risk  (07/05/2023)   Overall Financial Resource Strain (CARDIA)    Difficulty of Paying Living Expenses: Not very hard  Food Insecurity: No Food Insecurity (07/05/2023)   Hunger Vital Sign    Worried About Running Out of Food in the Last Year: Never true    Ran Out of Food in the Last Year: Never true  Transportation Needs: No Transportation Needs (07/05/2023)   PRAPARE - Administrator, Civil Service (Medical): No    Lack of Transportation (Non-Medical): No  Physical Activity: Inactive (07/05/2023)   Exercise Vital Sign    Days of Exercise per Week: 0 days    Minutes of Exercise per Session: 0 min  Stress: No Stress Concern Present (07/05/2023)   [employer] of Occupational Health - Occupational Stress Questionnaire    Feeling of Stress : Not at all  Social Connections: Moderately Integrated (07/05/2023)   Social Connection and Isolation Panel [NHANES]    Frequency of Communication with Friends and Family: More than three times a week    Frequency of Social Gatherings with Friends and Family: More than three times a week    Attends Religious Services: More than 4 times per year    Active Member of [employer] or Organizations: No    Attends Engineer, structural: Never    Marital Status: Married    Tobacco Counseling Counseling given: Not Answered   Clinical Intake:  Pre-visit preparation completed: Yes  Pain : No/denies  pain  Nutritional Risks: None Diabetes: Yes CBG done?: No Did pt. bring in CBG monitor from home?: No  How often do you need to have someone help you when you read instructions, pamphlets, or other written materials from your doctor or pharmacy?: 1 - Never  Interpreter Needed?: No  Information entered by :: Donne Anon, CMA   Activities of Daily Living    07/05/2023   10:20 AM  In your present state of health, do you have any difficulty performing the following activities:  Hearing? 0  Vision? 0  Difficulty concentrating or making decisions? 0  Walking or climbing stairs? 0  Dressing or bathing? 0  Doing errands, shopping? 0  Preparing Food and eating ? N  Using the Toilet? N  In the past six months, have you accidently leaked urine? Y  Comment a little stress incontinence  Do you have problems with loss of bowel control? N  Managing your Medications? N  Managing your Finances? N  Housekeeping or managing your Housekeeping? N    Patient Care Team: [name], MD as PCP - General (Family Medicine) Blondell Reveal, MD (Obstetrics and Gynecology) Joana Reamer Marveen Reeks, MD as Consulting Physician (Ophthalmology) Loman Chroman, Malena Catholic., MD (Gastroenterology)  Indicate any recent Medical Services you may have received from other than [facility] providers in the past year (date may be approximate).     Assessment:   This is a routine wellness examination for [place].  Hearing/Vision screen No results found.  Dietary issues and exercise activities discussed:     Goals Addressed   None    Depression Screen    07/05/2023   10:27 AM 04/20/2023   10:35 AM 10/19/2022    2:15 PM 04/16/2022    1:22 PM 03/25/2021    9:31 AM 03/19/2020   10:44 AM 09/23/2017    9:55 AM  PHQ 2/9 Scores  PHQ - 2 Score 0 0 0 0 0 0 0  PHQ- 9 Score  0 0   3     Fall Risk    07/05/2023   10:24 AM 04/20/2023   10:36 AM 10/19/2022    2:14 PM 04/16/2022    1:22 PM 03/25/2021    9:33 AM  Fall Risk   Falls  in the past year? 0 0 0 0 0  Number falls in past yr: 0 0 0 0   Injury with Fall? 0 0 0 0   Risk for fall due to : No Fall Risks   No Fall Risks   Follow up Falls evaluation completed Falls evaluation completed Falls evaluation completed Falls evaluation completed     MEDICARE RISK AT HOME:  Medicare Risk at Home - 07/05/23 1022     Any stairs in or around the home? Yes    If so, are there any without handrails? No    Home free of loose throw rugs in walkways, pet beds, electrical cords, etc? Yes    Adequate lighting in your home to reduce risk of falls? Yes    Life alert? No    Use of a cane, walker or w/c? No    Grab bars in the bathroom? No    Shower chair or bench in shower? No    Elevated toilet seat or a handicapped toilet? Yes   comfort height            TIMED UP AND GO:  Was the test performed? No    Cognitive Function:        07/05/2023   10:28 AM  6CIT Screen  What Year? 0 points  What month? 0 points  What time? 0 points  Count back from 20 0 points  Months in reverse 0 points  Repeat phrase 0 points  Total Score 0 points    Immunizations Immunization History  Administered Date(s) Administered   Influenza Split 09/14/2012   Influenza,inj,Quad PF,6+ Mos 11/06/2013, 08/29/2015   Pneumococcal Conjugate-13 11/06/2013   Tdap 12/04/2014   Zoster, Live 03/10/2016    TDAP status: Up to date  Flu Vaccine status: Up to date  Pneumococcal vaccine  status: Due, Education has been provided regarding the importance of this vaccine. Advised may receive this vaccine at local pharmacy or Health Dept. Aware to provide a copy of the vaccination record if obtained from local pharmacy or Health Dept. Verbalized acceptance and understanding.  Covid-19 vaccine status: Information provided on how to obtain vaccines.   Qualifies for Shingles Vaccine? Yes   Zostavax completed Yes   Shingrix Completed?: No.    Education has been provided regarding the importance of  this vaccine. Patient has been advised to call insurance company to determine out of pocket expense if they have not yet received this vaccine. Advised may also receive vaccine at local pharmacy or Health Dept. Verbalized acceptance and understanding.  Screening Tests Health Maintenance  Topic Date Due   Medicare Annual Wellness (AWV)  Never done   COVID-19 Vaccine (1) Never done   Zoster Vaccines- Shingrix (1 of 2) 04/06/1973   FOOT EXAM  03/10/2017   Pneumonia Vaccine 13+ Years old (2 of 2 - PPSV23 or PCV20) 04/07/2019   OPHTHALMOLOGY EXAM  03/26/2023   Diabetic kidney evaluation - Urine ACR  08/04/2023   INFLUENZA VACCINE  07/15/2023   HEMOGLOBIN A1C  10/21/2023   Diabetic kidney evaluation - eGFR measurement  04/19/2024   MAMMOGRAM  07/28/2024   DTaP/Tdap/Td (2 - Td or Tdap) 12/04/2024   Colonoscopy  03/27/2031   DEXA SCAN  Completed   Hepatitis C Screening  Completed   HPV VACCINES  Aged Out    Health Maintenance  Health Maintenance Due  Topic Date Due   Medicare Annual Wellness (AWV)  Never done   COVID-19 Vaccine (1) Never done   Zoster Vaccines- Shingrix (1 of 2) 04/06/1973   FOOT EXAM  03/10/2017   Pneumonia Vaccine 26+ Years old (2 of 2 - PPSV23 or PCV20) 04/07/2019   OPHTHALMOLOGY EXAM  03/26/2023   Diabetic kidney evaluation - Urine ACR  08/04/2023    Colorectal cancer screening: Type of screening: Colonoscopy. Completed 03/26/21. Repeat every 10 years  Mammogram status: Completed 07/28/22. Repeat every year  Bone Density status: Completed 10/04/20. Results reflect: Bone density results: OSTEOPENIA. Repeat every 2 years.  Lung Cancer Screening: (Low Dose CT Chest recommended if Age 2-80 years, 20 pack-year currently smoking OR have quit w/in 15years.) does not qualify.   Additional Screening:  Hepatitis C Screening: does qualify; Completed 09/07/16  Vision Screening: Recommended annual ophthalmology exams for early detection of glaucoma and other disorders of  the eye. Is the patient up to date with their annual eye exam?  Yes  Who is the provider or what is the name of the office in which the patient attends annual eye exams? [facility] If pt is not established with a provider, would they like to be referred to a provider to establish care? No .   Dental Screening: Recommended annual dental exams for proper oral hygiene  Diabetic Foot Exam: Diabetic Foot Exam: Overdue, Pt has been advised about the importance in completing this exam. Pt is scheduled for diabetic foot exam on N/a.  Community Resource Referral / Chronic Care Management: CRR required this visit?  No   CCM required this visit?  No     Plan:     I have personally reviewed and noted the following in the patient's chart:   Medical and social history Use of alcohol, tobacco or illicit drugs  Current medications and supplements including opioid prescriptions. Patient is not currently taking opioid prescriptions. Functional ability and status Nutritional status  Physical activity Advanced directives List of other physicians Hospitalizations, surgeries, and ER visits in previous 12 months Vitals Screenings to include cognitive, depression, and falls Referrals and appointments  In addition, I have reviewed and discussed with patient certain preventive protocols, quality metrics, and best practice recommendations. A written personalized care plan for preventive services as well as general preventive health recommendations were provided to patient.     Donne Anon, CMA   07/05/2023   After Visit Summary: (MyChart) Due to this being a telephonic visit, the after visit summary with patients personalized plan was offered to patient via MyChart   Nurse Notes: None

## 2023-07-05 NOTE — Patient Instructions (Signed)
Tiffany Mckenzie , Thank you for taking time to come for your Medicare Wellness Visit. I appreciate your ongoing commitment to your health goals. Please review the following plan we discussed and let me know if I can assist you in the future.   These are the goals we discussed:  Goals   None     This is a list of the screening recommended for you and due dates:  Health Maintenance  Topic Date Due   COVID-19 Vaccine (1) Never done   Zoster (Shingles) Vaccine (1 of 2) 04/06/1973   Complete foot exam   03/10/2017   Pneumonia Vaccine (2 of 2 - PPSV23 or PCV20) 04/07/2019   Eye exam for diabetics  03/26/2023   Yearly kidney health urinalysis for diabetes  08/04/2023   Flu Shot  07/15/2023   Hemoglobin A1C  10/21/2023   Yearly kidney function blood test for diabetes  04/19/2024   Medicare Annual Wellness Visit  07/04/2024   Mammogram  07/28/2024   DTaP/Tdap/Td vaccine (2 - Td or Tdap) 12/04/2024   Colon Cancer Screening  03/27/2031   DEXA scan (bone density measurement)  Completed   Hepatitis C Screening  Completed   HPV Vaccine  Aged Out     Next appointment: Follow up in one year for your annual wellness visit.   Preventive Care 4 Years and Older, Female Preventive care refers to lifestyle choices and visits with your health care provider that can promote health and wellness. What does preventive care include? A yearly physical exam. This is also called an annual well check. Dental exams once or twice a year. Routine eye exams. Ask your health care provider how often you should have your eyes checked. Personal lifestyle choices, including: Daily care of your teeth and gums. Regular physical activity. Eating a healthy diet. Avoiding tobacco and drug use. Limiting alcohol use. Practicing safe sex. Taking low-dose aspirin every day. Taking vitamin and mineral supplements as recommended by your health care provider. What happens during an annual well check? The services and  screenings done by your health care provider during your annual well check will depend on your age, overall health, lifestyle risk factors, and family history of disease. Counseling  Your health care provider may ask you questions about your: Alcohol use. Tobacco use. Drug use. Emotional well-being. Home and relationship well-being. Sexual activity. Eating habits. History of falls. Memory and ability to understand (cognition). Work and work Astronomer. Reproductive health. Screening  You may have the following tests or measurements: Height, weight, and BMI. Blood pressure. Lipid and cholesterol levels. These may be checked every 5 years, or more frequently if you are over 61 years old. Skin check. Lung cancer screening. You may have this screening every year starting at age 79 if you have a 30-pack-year history of smoking and currently smoke or have quit within the past 15 years. Fecal occult blood test (FOBT) of the stool. You may have this test every year starting at age 57. Flexible sigmoidoscopy or colonoscopy. You may have a sigmoidoscopy every 5 years or a colonoscopy every 10 years starting at age 13. Hepatitis C blood test. Hepatitis B blood test. Sexually transmitted disease (STD) testing. Diabetes screening. This is done by checking your blood sugar (glucose) after you have not eaten for a while (fasting). You may have this done every 1-3 years. Bone density scan. This is done to screen for osteoporosis. You may have this done starting at age 50. Mammogram. This may be done every  1-2 years. Talk to your health care provider about how often you should have regular mammograms. Talk with your health care provider about your test results, treatment options, and if necessary, the need for more tests. Vaccines  Your health care provider may recommend certain vaccines, such as: Influenza vaccine. This is recommended every year. Tetanus, diphtheria, and acellular pertussis (Tdap,  Td) vaccine. You may need a Td booster every 10 years. Zoster vaccine. You may need this after age 59. Pneumococcal 13-valent conjugate (PCV13) vaccine. One dose is recommended after age 56. Pneumococcal polysaccharide (PPSV23) vaccine. One dose is recommended after age 26. Talk to your health care provider about which screenings and vaccines you need and how often you need them. This information is not intended to replace advice given to you by your health care provider. Make sure you discuss any questions you have with your health care provider. Document Released: 12/27/2015 Document Revised: 08/19/2016 Document Reviewed: 10/01/2015 Elsevier Interactive Patient Education  2017 [employer].  Fall Prevention in the Home Falls can cause injuries. They can happen to people of all ages. There are many things you can do to make your home safe and to help prevent falls. What can I do on the outside of my home? Regularly fix the edges of walkways and driveways and fix any cracks. Remove anything that might make you trip as you walk through a door, such as a raised step or threshold. Trim any bushes or trees on the path to your home. Use bright outdoor lighting. Clear any walking paths of anything that might make someone trip, such as rocks or tools. Regularly check to see if handrails are loose or broken. Make sure that both sides of any steps have handrails. Any raised decks and porches should have guardrails on the edges. Have any leaves, snow, or ice cleared regularly. Use sand or salt on walking paths during winter. Clean up any spills in your garage right away. This includes oil or grease spills. What can I do in the bathroom? Use night lights. Install grab bars by the toilet and in the tub and shower. Do not use towel bars as grab bars. Use non-skid mats or decals in the tub or shower. If you need to sit down in the shower, use a plastic, non-slip stool. Keep the floor dry. Clean up any  water that spills on the floor as soon as it happens. Remove soap buildup in the tub or shower regularly. Attach bath mats securely with double-sided non-slip rug tape. Do not have throw rugs and other things on the floor that can make you trip. What can I do in the bedroom? Use night lights. Make sure that you have a light by your bed that is easy to reach. Do not use any sheets or blankets that are too big for your bed. They should not hang down onto the floor. Have a firm chair that has side arms. You can use this for support while you get dressed. Do not have throw rugs and other things on the floor that can make you trip. What can I do in the kitchen? Clean up any spills right away. Avoid walking on wet floors. Keep items that you use a lot in easy-to-reach places. If you need to reach something above you, use a strong step stool that has a grab bar. Keep electrical cords out of the way. Do not use floor polish or wax that makes floors slippery. If you must use wax, use non-skid  floor wax. Do not have throw rugs and other things on the floor that can make you trip. What can I do with my stairs? Do not leave any items on the stairs. Make sure that there are handrails on both sides of the stairs and use them. Fix handrails that are broken or loose. Make sure that handrails are as long as the stairways. Check any carpeting to make sure that it is firmly attached to the stairs. Fix any carpet that is loose or worn. Avoid having throw rugs at the top or bottom of the stairs. If you do have throw rugs, attach them to the floor with carpet tape. Make sure that you have a light switch at the top of the stairs and the bottom of the stairs. If you do not have them, ask someone to add them for you. What else can I do to help prevent falls? Wear shoes that: Do not have high heels. Have rubber bottoms. Are comfortable and fit you well. Are closed at the toe. Do not wear sandals. If you use a  stepladder: Make sure that it is fully opened. Do not climb a closed stepladder. Make sure that both sides of the stepladder are locked into place. Ask someone to hold it for you, if possible. Clearly mark and make sure that you can see: Any grab bars or handrails. First and last steps. Where the edge of each step is. Use tools that help you move around (mobility aids) if they are needed. These include: Canes. Walkers. Scooters. Crutches. Turn on the lights when you go into a dark area. Replace any light bulbs as soon as they burn out. Set up your furniture so you have a clear path. Avoid moving your furniture around. If any of your floors are uneven, fix them. If there are any pets around you, be aware of where they are. Review your medicines with your doctor. Some medicines can make you feel dizzy. This can increase your chance of falling. Ask your doctor what other things that you can do to help prevent falls. This information is not intended to replace advice given to you by your health care provider. Make sure you discuss any questions you have with your health care provider. Document Released: 09/26/2009 Document Revised: 05/07/2016 Document Reviewed: 01/04/2015 Elsevier Interactive Patient Education  2017 [employer].

## 2023-08-02 DIAGNOSIS — Z1231 Encounter for screening mammogram for malignant neoplasm of breast: Secondary | ICD-10-CM | POA: Diagnosis not present

## 2023-08-02 LAB — HM MAMMOGRAPHY

## 2023-08-04 ENCOUNTER — Encounter: Payer: Self-pay | Admitting: Family Medicine

## 2023-08-06 ENCOUNTER — Other Ambulatory Visit: Payer: Self-pay | Admitting: Family Medicine

## 2023-09-06 ENCOUNTER — Telehealth: Payer: Self-pay | Admitting: Family Medicine

## 2023-09-06 ENCOUNTER — Other Ambulatory Visit: Payer: Self-pay | Admitting: Family Medicine

## 2023-09-06 MED ORDER — HYDROCODONE BIT-HOMATROP MBR 5-1.5 MG/5ML PO SOLN: 5.0000 mL | Freq: Four times a day (QID) | ORAL | 0 refills | Status: DC | PRN

## 2023-09-06 NOTE — Telephone Encounter (Signed)
Prescription Request  09/06/2023  Is this a "Controlled Substance" medicine? Yes  LOV: 04/20/2023  What is the name of the medication or equipment?   HYDROcodone bit-homatropine (HYDROMET) 5-1.5 MG/5ML syrup [[id number]]  Have you contacted your pharmacy to request a refill? Yes   Which pharmacy would you like this sent to?  [id number] - [place], [place] - [address] [place] [address] Phone: [phone] Fax: [phone]  Patient notified that their request is being sent to the clinical staff for review and that they should receive a response within 2 business days.   Please advise at Mobile [phone] (mobile)

## 2023-09-16 DIAGNOSIS — R69 Illness, unspecified: Secondary | ICD-10-CM | POA: Diagnosis not present

## 2023-09-20 DIAGNOSIS — Z7984 Long term (current) use of oral hypoglycemic drugs: Secondary | ICD-10-CM | POA: Diagnosis not present

## 2023-09-20 DIAGNOSIS — H40001 Preglaucoma, unspecified, right eye: Secondary | ICD-10-CM | POA: Diagnosis not present

## 2023-09-20 DIAGNOSIS — Z961 Presence of intraocular lens: Secondary | ICD-10-CM | POA: Diagnosis not present

## 2023-09-20 DIAGNOSIS — Z83511 Family history of glaucoma: Secondary | ICD-10-CM | POA: Diagnosis not present

## 2023-09-20 DIAGNOSIS — H02834 Dermatochalasis of left upper eyelid: Secondary | ICD-10-CM | POA: Diagnosis not present

## 2023-09-20 DIAGNOSIS — H04123 Dry eye syndrome of bilateral lacrimal glands: Secondary | ICD-10-CM | POA: Diagnosis not present

## 2023-09-20 DIAGNOSIS — H02831 Dermatochalasis of right upper eyelid: Secondary | ICD-10-CM | POA: Diagnosis not present

## 2023-09-20 DIAGNOSIS — H524 Presbyopia: Secondary | ICD-10-CM | POA: Diagnosis not present

## 2023-09-20 DIAGNOSIS — E119 Type 2 diabetes mellitus without complications: Secondary | ICD-10-CM | POA: Diagnosis not present

## 2023-09-20 DIAGNOSIS — H43393 Other vitreous opacities, bilateral: Secondary | ICD-10-CM | POA: Diagnosis not present

## 2023-09-20 LAB — HM DIABETES EYE EXAM

## 2023-10-10 ENCOUNTER — Other Ambulatory Visit: Payer: Self-pay | Admitting: Family Medicine

## 2023-10-29 NOTE — Progress Notes (Unsigned)
Complete physical exam   Patient: Tiffany Mckenzie   DOB: Dec 18, 1955   67 y.o. Female  MRN: [id number] Visit Date: 11/01/2023  Today's healthcare provider: Alfredia Ferguson, PA-C  Cc. cpe  Subjective    Tiffany Mckenzie is a 67 y.o. female who presents today for a complete physical exam.  Denies any concerns today.  Past Medical History:  Diagnosis Date   Allergic rhinitis    Allergic state 03/03/2015   Annual physical exam 02/09/2012   Anxiety state 09/30/2016   Arthritis    Asthma    Constipation 03/03/2015   Diabetes mellitus type 2 in obese 09/13/2012   History of chicken pox    History of hepatitis    age 1   Hyperglycemia 09/13/2012   Hypertension    Hypothyroidism    Left carotid bruit 03/11/2016   Obesity 03/28/2017   Overactive bladder 03/17/2013   Seeing urology   Raynaud phenomenon 03/03/2015   Type II or unspecified type diabetes mellitus without mention of complication, uncontrolled 09/13/2012   Past Surgical History:  Procedure Laterality Date   ABDOMINAL HYSTERECTOMY  2002   TONSILLECTOMY     Social History   Socioeconomic History   Marital status: Married    Spouse name: Not on file   Number of children: Not on file   Years of education: Not on file   Highest education level: Not on file  Occupational History   Not on file  Tobacco Use   Smoking status: Never   Smokeless tobacco: Never  Substance and Sexual Activity   Alcohol use: Yes    Comment: less than once a month, white wine or mixed drink   Drug use: No   Sexual activity: Not on file  Other Topics Concern   Not on file  Social History Narrative   Not on file   Social Determinants of Health   Financial Resource Strain: Low Risk  (07/05/2023)   Overall Financial Resource Strain (CARDIA)    Difficulty of Paying Living Expenses: Not very hard  Food Insecurity: No Food Insecurity (07/05/2023)   Hunger Vital Sign    Worried About Running Out of Food in the Last Year: Never true    Ran Out of Food  in the Last Year: Never true  Transportation Needs: No Transportation Needs (07/05/2023)   PRAPARE - Administrator, Civil Service (Medical): No    Lack of Transportation (Non-Medical): No  Physical Activity: Inactive (07/05/2023)   Exercise Vital Sign    Days of Exercise per Week: 0 days    Minutes of Exercise per Session: 0 min  Stress: No Stress Concern Present (07/05/2023)   [employer] of Occupational Health - Occupational Stress Questionnaire    Feeling of Stress : Not at all  Social Connections: Moderately Integrated (07/05/2023)   Social Connection and Isolation Panel [NHANES]    Frequency of Communication with Friends and Family: More than three times a week    Frequency of Social Gatherings with Friends and Family: More than three times a week    Attends Religious Services: More than 4 times per year    Active Member of [employer] or Organizations: No    Attends Banker Meetings: Never    Marital Status: Married  Catering manager Violence: Not At Risk (07/05/2023)   Humiliation, Afraid, Rape, and Kick questionnaire    Fear of Current or Ex-Partner: No    Emotionally Abused: No    Physically Abused: No  Sexually Abused: No   Family Status  Relation Name Status   Father  Deceased at age 100       smoker   Mother  Deceased at age 17   PGM  Deceased at age 25   MGM  Deceased at age 74   MGF  Deceased at age 28   Sister  Alive       11   Daughter  Alive       62   PGF  Deceased at age 50       smoker   Sister  Alive       22   Daughter  Alive       35  No partnership data on file   Family History  Problem Relation Age of Onset   Alcohol abuse Father    Cancer Father        lung   Diabetes Father    Arthritis Mother    Heart disease Mother        Blockage & double bypass   Hypertension Mother    Diabetes Mother    Colon cancer Paternal Grandmother    Stroke Paternal Grandmother        maternal grandfather   Breast cancer Maternal  Grandmother    Heart disease Maternal Grandfather    Scoliosis Daughter    Cancer Paternal Grandfather        lung   Allergies  Allergen Reactions   Codeine Nausea And Vomiting    vomiting   Clarithromycin Rash    Patient Care Team: [name], MD as PCP - General (Family Medicine) Blondell Reveal, MD (Obstetrics and Gynecology) Joana Reamer Marveen Reeks, MD as Consulting Physician (Ophthalmology) Mirian Mo., MD (Gastroenterology)   Medications: Outpatient Medications Prior to Visit  Medication Sig   fluticasone (FLOVENT HFA) 110 MCG/ACT inhaler Inhale 2 puffs into the lungs in the morning and at bedtime. Rinse mouth out after use.   levothyroxine (SYNTHROID) 100 MCG tablet TAKE ONE TABLET DAILY BEFORE BREAKFAST 6 DAYS A WEEK.   loratadine (CLARITIN) 10 MG tablet Take 10 mg by mouth daily.   LORazepam (ATIVAN) 0.5 MG tablet TAKE 1 TABLET BY MOUTH TWICE A DAY AS NEEDED   metFORMIN (GLUCOPHAGE) 500 MG tablet Take 1 tablet (500 mg total) by mouth 2 (two) times daily with a meal.   olmesartan (BENICAR) 20 MG tablet Take 0.5 tablets (10 mg total) by mouth daily.   Semaglutide, 2 MG/DOSE, (OZEMPIC, 2 MG/DOSE,) 8 MG/3ML SOPN INJECT 2MG  AS DIRECTED ONCE A WEEK   triamcinolone cream (KENALOG) 0.1 % Apply 1 Application topically 2 (two) times daily as needed (rash).   [DISCONTINUED] Semaglutide,0.25 or 0.5MG /DOS, 2 MG/3ML SOPN Inject 1 mg into the skin once a week.   [DISCONTINUED] HYDROcodone bit-homatropine (HYDROMET) 5-1.5 MG/5ML syrup Take 5 mLs by mouth every 6 (six) hours as needed for cough.   No facility-administered medications prior to visit.    Review of Systems  Constitutional:  Negative for fatigue and fever.  Respiratory:  Negative for cough and shortness of breath.   Cardiovascular:  Negative for chest pain and leg swelling.  Gastrointestinal:  Negative for abdominal pain.  Neurological:  Negative for dizziness and headaches.      Objective    BP 98/63    Pulse 77   Temp 97.9 F (36.6 C) (Oral)   Ht 5\' 2"  (1.575 m)   Wt 154 lb 2 oz (69.9 kg)   SpO2 97%  BMI 28.19 kg/m    Physical Exam Constitutional:      General: She is awake.     Appearance: She is well-developed. She is not ill-appearing.  HENT:     Head: Normocephalic.     Right Ear: Tympanic membrane normal.     Left Ear: Tympanic membrane normal.     Nose: Nose normal. No congestion or rhinorrhea.     Mouth/Throat:     Pharynx: No oropharyngeal exudate or posterior oropharyngeal erythema.  Eyes:     Conjunctiva/sclera: Conjunctivae normal.     Pupils: Pupils are equal, round, and reactive to light.  Neck:     Thyroid: No thyroid mass or thyromegaly.  Cardiovascular:     Rate and Rhythm: Normal rate and regular rhythm.     Pulses:          Dorsalis pedis pulses are 2+ on the right side and 2+ on the left side.       Posterior tibial pulses are 2+ on the right side and 2+ on the left side.     Heart sounds: Normal heart sounds.  Pulmonary:     Effort: Pulmonary effort is normal.     Breath sounds: Normal breath sounds.  Abdominal:     Palpations: Abdomen is soft.     Tenderness: There is no abdominal tenderness.  Musculoskeletal:     Right lower leg: No swelling. No edema.     Left lower leg: No swelling. No edema.  Feet:     Right foot:     Protective Sensation: 4 sites tested.  4 sites sensed.     Skin integrity: Skin integrity normal.     Toenail Condition: Right toenails are normal.     Left foot:     Protective Sensation: 4 sites tested.  4 sites sensed.     Skin integrity: Skin integrity normal.     Toenail Condition: Left toenails are normal.     Comments: Pt reports right plantar surface slightly diminished compared to left Lymphadenopathy:     Cervical: No cervical adenopathy.  Skin:    General: Skin is warm.  Neurological:     Mental Status: She is alert and oriented to person, place, and time.  Psychiatric:        Attention and Perception:  Attention normal.        Mood and Affect: Mood normal.        Speech: Speech normal.        Behavior: Behavior normal. Behavior is cooperative.     Last depression screening scores    11/01/2023    2:19 PM 07/05/2023   10:27 AM 04/20/2023   10:35 AM  PHQ 2/9 Scores  PHQ - 2 Score 0 0 0  PHQ- 9 Score   0   Last fall risk screening    11/01/2023    2:19 PM  Fall Risk   Falls in the past year? 0  Number falls in past yr: 0  Injury with Fall? 0  Risk for fall due to : No Fall Risks  Follow up Falls evaluation completed   Last Audit-C alcohol use screening    07/05/2023   10:26 AM  Alcohol Use Disorder Test (AUDIT)  1. How often do you have a drink containing alcohol? 1  2. How many drinks containing alcohol do you have on a typical day when you are drinking? 0  3. How often do you have six or more drinks on one occasion?  0  AUDIT-C Score 1   A score of 3 or more in women, and 4 or more in men indicates increased risk for alcohol abuse, EXCEPT if all of the points are from question 1   No results found for any visits on 11/01/23.  Assessment & Plan    Routine Health Maintenance and Physical Exam  Exercise Activities and Dietary recommendations   --balanced diet high in fiber and protein, low in sugars, carbs, fats. --physical activity/exercise 20-30 minutes 3-5 times a week    Immunization History  Administered Date(s) Administered   Influenza Split 09/14/2012   Influenza,inj,Quad PF,6+ Mos 11/06/2013, 08/29/2015   Pneumococcal Conjugate-13 11/06/2013   Tdap 12/04/2014   Zoster, Live 03/10/2016    Health Maintenance  Topic Date Due   COVID-19 Vaccine (1) Never done   Zoster Vaccines- Shingrix (1 of 2) 04/06/1973   Pneumonia Vaccine 70+ Years old (2 of 2 - PPSV23 or PCV20) 04/07/2019   Diabetic kidney evaluation - Urine ACR  08/04/2023   HEMOGLOBIN A1C  10/21/2023   INFLUENZA VACCINE  03/13/2024 (Originally 07/15/2023)   Diabetic kidney evaluation - eGFR  measurement  04/19/2024   Medicare Annual Wellness (AWV)  07/04/2024   OPHTHALMOLOGY EXAM  09/13/2024   FOOT EXAM  10/31/2024   DTaP/Tdap/Td (2 - Td or Tdap) 12/04/2024   MAMMOGRAM  08/01/2025   Colonoscopy  03/27/2031   DEXA SCAN  Completed   Hepatitis C Screening  Completed   HPV VACCINES  Aged Out    Discussed health benefits of physical activity, and encouraged her to engage in regular exercise appropriate for her age and condition.  Problem List Items Addressed This Visit       Cardiovascular and Mediastinum   Primary hypertension    Asymptomatic hypotension today, pt has been fasting. Typically well controlled. Managed with olmesartan 20 mg  Ordered cmp F/u 6 mo        Endocrine   Type 2 diabetes mellitus with obesity (HCC)    Pt manages with metformin 500 mg bid,ozempic 2 mg weekly. Ordering A1c today, last A1c 04/20/23, 5.8% On arb, no statin Declines vaccines Foot exam completed, uacr ordered. Pt reports optho exam utd.  F/u 6 mo       Relevant Orders   HgB A1c   Urine Microalbumin w/creat. ratio   Comp Met (CMET)   Hyperlipidemia associated with type 2 diabetes mellitus (HCC)    LDL was at goal, < 70 04/20/23 Not on statin      Other Visit Diagnoses     Annual physical exam    -  Primary       Pt declines vaccines today.  Return in about 6 months (around 04/30/2024) for chronic conditions, DMII, hypertension.     Alfredia Ferguson, PA-C  [facility] [phone] (phone) [phone] (fax)  [facility]

## 2023-11-01 ENCOUNTER — Encounter: Payer: Self-pay | Admitting: Physician Assistant

## 2023-11-01 ENCOUNTER — Encounter: Payer: Medicare HMO | Admitting: Family Medicine

## 2023-11-01 ENCOUNTER — Ambulatory Visit (INDEPENDENT_AMBULATORY_CARE_PROVIDER_SITE_OTHER): Payer: Medicare HMO | Admitting: Physician Assistant

## 2023-11-01 VITALS — BP 98/63 | HR 77 | Temp 97.9°F | Ht 62.0 in | Wt 154.1 lb

## 2023-11-01 DIAGNOSIS — E785 Hyperlipidemia, unspecified: Secondary | ICD-10-CM

## 2023-11-01 DIAGNOSIS — E1159 Type 2 diabetes mellitus with other circulatory complications: Secondary | ICD-10-CM

## 2023-11-01 DIAGNOSIS — E039 Hypothyroidism, unspecified: Secondary | ICD-10-CM

## 2023-11-01 DIAGNOSIS — Z0001 Encounter for general adult medical examination with abnormal findings: Secondary | ICD-10-CM | POA: Diagnosis not present

## 2023-11-01 DIAGNOSIS — E669 Obesity, unspecified: Secondary | ICD-10-CM

## 2023-11-01 DIAGNOSIS — I10 Essential (primary) hypertension: Secondary | ICD-10-CM

## 2023-11-01 DIAGNOSIS — I152 Hypertension secondary to endocrine disorders: Secondary | ICD-10-CM

## 2023-11-01 DIAGNOSIS — Z7984 Long term (current) use of oral hypoglycemic drugs: Secondary | ICD-10-CM

## 2023-11-01 DIAGNOSIS — Z0000 Encounter for general adult medical examination without abnormal findings: Secondary | ICD-10-CM | POA: Diagnosis not present

## 2023-11-01 DIAGNOSIS — E1169 Type 2 diabetes mellitus with other specified complication: Secondary | ICD-10-CM | POA: Diagnosis not present

## 2023-11-01 DIAGNOSIS — F411 Generalized anxiety disorder: Secondary | ICD-10-CM

## 2023-11-01 DIAGNOSIS — J45909 Unspecified asthma, uncomplicated: Secondary | ICD-10-CM

## 2023-11-01 NOTE — Assessment & Plan Note (Signed)
LDL was at goal, < 70 04/20/23 Not on statin

## 2023-11-01 NOTE — Assessment & Plan Note (Signed)
>>  ASSESSMENT AND PLAN FOR TYPE 2 DIABETES MELLITUS WITH OBESITY WRITTEN ON 11/01/2023  3:04 PM BY DRUBEL, LINDSAY, PA-C  Pt manages with metformin  500 mg bid,ozempic  2 mg weekly. Ordering A1c today, last A1c 04/20/23, 5.8% On arb, no statin Declines vaccines Foot exam completed, uacr ordered. Pt reports optho exam utd.  F/u 6 mo

## 2023-11-01 NOTE — Assessment & Plan Note (Signed)
Pt manages with metformin 500 mg bid,ozempic 2 mg weekly. Ordering A1c today, last A1c 04/20/23, 5.8% On arb, no statin Declines vaccines Foot exam completed, uacr ordered. Pt reports optho exam utd.  F/u 6 mo

## 2023-11-01 NOTE — Assessment & Plan Note (Signed)
Asymptomatic hypotension today, pt has been fasting. Typically well controlled. Managed with olmesartan 20 mg  Ordered cmp F/u 6 mo

## 2023-11-02 LAB — COMPREHENSIVE METABOLIC PANEL
ALT: 10 U/L (ref 0–35)
AST: 13 U/L (ref 0–37)
Albumin: 4 g/dL (ref 3.5–5.2)
Alkaline Phosphatase: 82 U/L (ref 39–117)
BUN: 14 mg/dL (ref 6–23)
CO2: 29 meq/L (ref 19–32)
Calcium: 8.9 mg/dL (ref 8.4–10.5)
Chloride: 102 meq/L (ref 96–112)
Creatinine, Ser: 0.85 mg/dL (ref 0.40–1.20)
GFR: 69.83 mL/min (ref >60.00)
Glucose, Bld: 136 mg/dL — ABNORMAL HIGH (ref 70–99)
Potassium: 3.9 meq/L (ref 3.5–5.1)
Sodium: 138 meq/L (ref 135–145)
Total Bilirubin: 0.6 mg/dL (ref 0.2–1.2)
Total Protein: 6.2 g/dL (ref 6.0–8.3)

## 2023-11-02 LAB — MICROALBUMIN / CREATININE URINE RATIO
Creatinine,U: 163.7 mg/dL
Microalb Creat Ratio: 0.4 mg/g (ref 0.0–30.0)
Microalb, Ur: <0.7 mg/dL (ref 0.0–1.9)

## 2023-11-02 LAB — HEMOGLOBIN A1C: Hgb A1c MFr Bld: 5.8 % (ref 4.6–6.5)

## 2023-11-12 ENCOUNTER — Other Ambulatory Visit: Payer: Self-pay | Admitting: Family Medicine

## 2023-11-17 ENCOUNTER — Telehealth: Payer: Self-pay | Admitting: Family Medicine

## 2023-11-17 NOTE — Telephone Encounter (Signed)
Patient called to advise that her insurance will not be covering her thyroid medicine starting in January and she would like to know if there are any alternatives available for her to take. Please give her a call back to advise.

## 2023-11-18 NOTE — Telephone Encounter (Signed)
Spoke with patient and she reached out to her insurance company and was told she could use [facility]

## 2023-11-24 DIAGNOSIS — R300 Dysuria: Secondary | ICD-10-CM | POA: Diagnosis not present

## 2023-11-24 DIAGNOSIS — Z8619 Personal history of other infectious and parasitic diseases: Secondary | ICD-10-CM | POA: Diagnosis not present

## 2023-11-24 DIAGNOSIS — N3000 Acute cystitis without hematuria: Secondary | ICD-10-CM | POA: Diagnosis not present

## 2024-01-28 ENCOUNTER — Other Ambulatory Visit: Payer: Self-pay | Admitting: Family Medicine

## 2024-02-14 ENCOUNTER — Telehealth: Admitting: Family Medicine

## 2024-02-14 DIAGNOSIS — R3989 Other symptoms and signs involving the genitourinary system: Secondary | ICD-10-CM | POA: Diagnosis not present

## 2024-02-14 MED ORDER — CEPHALEXIN 500 MG PO CAPS: 500.0000 mg | ORAL_CAPSULE | Freq: Two times a day (BID) | ORAL | 0 refills | Status: AC

## 2024-02-14 NOTE — Progress Notes (Signed)

## 2024-03-11 ENCOUNTER — Other Ambulatory Visit: Payer: Self-pay | Admitting: Family Medicine

## 2024-03-21 DIAGNOSIS — H0100B Unspecified blepharitis left eye, upper and lower eyelids: Secondary | ICD-10-CM | POA: Diagnosis not present

## 2024-03-21 DIAGNOSIS — H47393 Other disorders of optic disc, bilateral: Secondary | ICD-10-CM | POA: Diagnosis not present

## 2024-03-21 DIAGNOSIS — H0100A Unspecified blepharitis right eye, upper and lower eyelids: Secondary | ICD-10-CM | POA: Diagnosis not present

## 2024-03-21 DIAGNOSIS — Z83511 Family history of glaucoma: Secondary | ICD-10-CM | POA: Diagnosis not present

## 2024-03-21 DIAGNOSIS — H40001 Preglaucoma, unspecified, right eye: Secondary | ICD-10-CM | POA: Diagnosis not present

## 2024-03-21 DIAGNOSIS — H02721 Madarosis of right upper eyelid and periocular area: Secondary | ICD-10-CM | POA: Diagnosis not present

## 2024-04-25 DIAGNOSIS — N9089 Other specified noninflammatory disorders of vulva and perineum: Secondary | ICD-10-CM | POA: Diagnosis not present

## 2024-04-30 NOTE — Assessment & Plan Note (Signed)
 Hydrate and monitor

## 2024-04-30 NOTE — Assessment & Plan Note (Signed)
hgba1c acceptable, minimize simple carbs. Increase exercise as tolerated. Continue current meds is tolerating Ozempic and is given refilll 

## 2024-04-30 NOTE — Assessment & Plan Note (Signed)
>>  ASSESSMENT AND PLAN FOR TYPE 2 DIABETES MELLITUS WITH OBESITY WRITTEN ON 04/30/2024  8:14 PM BY BLYTH, STACEY A, MD  hgba1c acceptable, minimize simple carbs. Increase exercise as tolerated. Continue current meds is tolerating Ozempic  and is given refilll

## 2024-04-30 NOTE — Assessment & Plan Note (Signed)
 On Levothyroxine, continue to monitor

## 2024-04-30 NOTE — Assessment & Plan Note (Signed)
 Encourage heart healthy diet such as MIND or DASH diet, increase exercise, avoid trans fats, simple carbohydrates and processed foods, consider a krill or fish or flaxseed oil cap daily.

## 2024-04-30 NOTE — Assessment & Plan Note (Signed)
 Supplement and monitor

## 2024-05-01 ENCOUNTER — Ambulatory Visit (HOSPITAL_BASED_OUTPATIENT_CLINIC_OR_DEPARTMENT_OTHER)
Admission: RE | Admit: 2024-05-01 | Discharge: 2024-05-01 | Disposition: A | Discharge status: Home / Self Care | Source: Ambulatory Visit | Attending: Family Medicine | Admitting: Family Medicine

## 2024-05-01 ENCOUNTER — Encounter: Payer: Self-pay | Admitting: Family Medicine

## 2024-05-01 ENCOUNTER — Ambulatory Visit (INDEPENDENT_AMBULATORY_CARE_PROVIDER_SITE_OTHER): Payer: Medicare HMO | Admitting: Family Medicine

## 2024-05-01 VITALS — BP 128/66 | HR 82 | Temp 98.3°F | Resp 16 | Ht 62.0 in | Wt 148.6 lb

## 2024-05-01 DIAGNOSIS — E039 Hypothyroidism, unspecified: Secondary | ICD-10-CM

## 2024-05-01 DIAGNOSIS — E785 Hyperlipidemia, unspecified: Secondary | ICD-10-CM | POA: Diagnosis not present

## 2024-05-01 DIAGNOSIS — M4316 Spondylolisthesis, lumbar region: Secondary | ICD-10-CM | POA: Diagnosis not present

## 2024-05-01 DIAGNOSIS — E669 Obesity, unspecified: Secondary | ICD-10-CM

## 2024-05-01 DIAGNOSIS — M545 Low back pain, unspecified: Secondary | ICD-10-CM | POA: Diagnosis not present

## 2024-05-01 DIAGNOSIS — R252 Cramp and spasm: Secondary | ICD-10-CM | POA: Diagnosis not present

## 2024-05-01 DIAGNOSIS — Z79899 Other long term (current) drug therapy: Secondary | ICD-10-CM | POA: Diagnosis not present

## 2024-05-01 DIAGNOSIS — E1169 Type 2 diabetes mellitus with other specified complication: Secondary | ICD-10-CM

## 2024-05-01 DIAGNOSIS — E538 Deficiency of other specified B group vitamins: Secondary | ICD-10-CM | POA: Diagnosis not present

## 2024-05-01 DIAGNOSIS — M419 Scoliosis, unspecified: Secondary | ICD-10-CM | POA: Diagnosis not present

## 2024-05-01 DIAGNOSIS — Z7985 Long-term (current) use of injectable non-insulin antidiabetic drugs: Secondary | ICD-10-CM

## 2024-05-01 DIAGNOSIS — M48061 Spinal stenosis, lumbar region without neurogenic claudication: Secondary | ICD-10-CM | POA: Diagnosis not present

## 2024-05-01 DIAGNOSIS — M47816 Spondylosis without myelopathy or radiculopathy, lumbar region: Secondary | ICD-10-CM | POA: Diagnosis not present

## 2024-05-01 LAB — COMPREHENSIVE METABOLIC PANEL WITH GFR
ALT: 12 U/L (ref 0–35)
AST: 14 U/L (ref 0–37)
Albumin: 3.9 g/dL (ref 3.5–5.2)
Alkaline Phosphatase: 102 U/L (ref 39–117)
BUN: 14 mg/dL (ref 6–23)
CO2: 29 meq/L (ref 19–32)
Calcium: 8.6 mg/dL (ref 8.4–10.5)
Chloride: 104 meq/L (ref 96–112)
Creatinine, Ser: 0.69 mg/dL (ref 0.40–1.20)
GFR: 88.15 mL/min (ref >60.00)
Glucose, Bld: 70 mg/dL (ref 70–99)
Potassium: 3.9 meq/L (ref 3.5–5.1)
Sodium: 138 meq/L (ref 135–145)
Total Bilirubin: 0.4 mg/dL (ref 0.2–1.2)
Total Protein: 6.2 g/dL (ref 6.0–8.3)

## 2024-05-01 LAB — LIPID PANEL
Cholesterol: 149 mg/dL (ref 0–200)
HDL: 43.7 mg/dL (ref >39.00)
LDL Cholesterol: 86 mg/dL (ref 0–99)
NonHDL: 105.56
Total CHOL/HDL Ratio: 3
Triglycerides: 99 mg/dL (ref 0.0–149.0)
VLDL: 19.8 mg/dL (ref 0.0–40.0)

## 2024-05-01 LAB — CBC WITH DIFFERENTIAL/PLATELET
Basophils Absolute: 0 10*3/uL (ref 0.0–0.1)
Basophils Relative: 0.6 % (ref 0.0–3.0)
Eosinophils Absolute: 0.3 10*3/uL (ref 0.0–0.7)
Eosinophils Relative: 3.5 % (ref 0.0–5.0)
HCT: 36.8 % (ref 36.0–46.0)
Hemoglobin: 12.6 g/dL (ref 12.0–15.0)
Lymphocytes Relative: 27.4 % (ref 12.0–46.0)
Lymphs Abs: 2.2 10*3/uL (ref 0.7–4.0)
MCHC: 34.2 g/dL (ref 30.0–36.0)
MCV: 87.3 fl (ref 78.0–100.0)
Monocytes Absolute: 0.7 10*3/uL (ref 0.1–1.0)
Monocytes Relative: 9.1 % (ref 3.0–12.0)
Neutro Abs: 4.7 10*3/uL (ref 1.4–7.7)
Neutrophils Relative %: 59.4 % (ref 43.0–77.0)
Platelets: 241 10*3/uL (ref 150.0–400.0)
RBC: 4.22 Mil/uL (ref 3.87–5.11)
RDW: 14.2 % (ref 11.5–15.5)
WBC: 7.9 10*3/uL (ref 4.0–10.5)

## 2024-05-01 LAB — HEMOGLOBIN A1C: Hgb A1c MFr Bld: 6 % (ref 4.6–6.5)

## 2024-05-01 MED ORDER — HYDROCODONE BIT-HOMATROP MBR 5-1.5 MG/5ML PO SOLN: 5.0000 mL | Freq: Four times a day (QID) | ORAL | 0 refills | Status: DC | PRN

## 2024-05-01 NOTE — Patient Instructions (Signed)
 Add back Flonase and consider increasing Claritin to twice daily for next week or so  Allergies, Adult An allergy is a condition that causes the body's defense system (immune system) to react too strongly to an allergen. An allergen is a substance that is harmless to most people but can cause a reaction in some people. Allergies often affect the nose (allergic rhinitis), eyes (conjunctivitis), skin (atopic dermatitis), and stomach. They can be mild, moderate, or severe. They cannot spread from person to person. Allergies can start at any age. In some cases, they may go away as you get older. What are the causes? Allergies are caused by allergens. These may be: Outdoor allergens. These include pollen, car fumes, and mold. Indoor allergens. These include dust, smoke, mold, and pet dander. Other allergens. These include foods, medicines, scents, and insect bites or stings. What increases the risk? You are more likely to have allergies if you have: Family members with allergies. Family members who have a condition that may be caused by allergens, such as asthma. What are the signs or symptoms? Symptoms depend on how severe your allergy is. Mild to moderate symptoms Runny nose, stuffy nose (nasal congestion), or sneezing. Itchy mouth, ears, or throat. Postnasal drip. This is a feeling of mucus dripping down the back of your throat. Sore throat. Itchy, red, watery, or puffy eyes. Skin rash, or itchy, red, swollen areas of skin (hives). Stomach cramps or bloating. Severe symptoms A bad allergy to food, medicine, or insect bites may cause a severe reaction (anaphylactic reaction). Symptoms include: A red face. Coughing or making high-pitched whistling sounds when you breathe, most often when you breathe out (wheezing). Swollen lips, tongue, or mouth. A tight or swollen throat. Chest pain or tightness, or a fast heartbeat. Trouble breathing or shortness of breath. Pain in your  abdomen. Vomiting or diarrhea. Feeling dizzy or fainting. How is this diagnosed? Allergies are diagnosed based on your symptoms, your family and medical history, and a physical exam. You may also have tests, such as: Skin tests. These may be done to see how your skin reacts to allergens. Tests include: Skin prick test. For this test, the allergen is put in your body through a small prick in the skin. Intradermal skin test. For this test, a small amount of the allergen is put under the first layer of your skin. Patch test. For this test, a small amount of the allergen is placed on your skin. The area is covered and then checked after a few days. Blood tests. A challenge test. For this test, you eat or breathe in the allergen to see if you have a reaction. You may also be asked to: Keep a food diary. This means writing down all the foods, drinks, and symptoms you have in a day. Try an elimination diet. To do this: Stop eating certain foods. Add those foods back one by one to find out if any of them cause a reaction. How is this treated?     Treatment for allergies depends on your symptoms. It may include: Cold, wet cloths (cold compresses). These can be used to soothe itching and swelling. Eye drops or nasal sprays. A saline solution to clear out your nose and keep it moist (nasal irrigation). A saline solution is made of salt and water. A humidifier. This can add moisture to the air. Skin creams. These can treat rashes or itching. Diet changes to cut out foods that cause allergies. Being exposed again and again to tiny  amounts of allergens. This can help your body build a defense against them (tolerance). This process is called immunotherapy. It may be done using: Allergy shots. This is when you get a shot of the allergen. Sublingual immunotherapy. This is when you take a small dose of the allergen under your tongue. Allergy medicines (antihistamines) or other medicines. These can help  block the allergic reaction. Using an auto-injector pen. An auto-injector pen is a device filled with medicine that gives an emergency shot of epinephrine. Your health care provider will teach you how to use it. Follow these instructions at home: Medicines  Take or apply over-the-counter and prescription medicines only as told by your provider. Always carry your auto-injector pen if you are at risk of an anaphylactic reaction. Give yourself the shot as told by your provider. Eating and drinking Follow instructions from your provider about what you may eat and drink. Drink enough fluid to keep your pee (urine) pale yellow. General instructions Wear a medical alert bracelet or necklace if you have had an anaphylactic reaction in the past. Avoid known allergens when you can. Keep all follow-up visits. Your provider will watch your symptoms and talk about treatment options with you. Contact a health care provider if: Your symptoms do not get better with treatment. Get help right away if: You have any symptoms of anaphylactic reaction. You use an auto-injector pen. You will need more medical care even if the medicine seems to be working. An anaphylactic reaction may happen again within 72 hours (rebound anaphylaxis). These symptoms may be an emergency. Use the auto-injector pen right away. Then call 911. Do not wait to see if the symptoms will go away. Do not drive yourself to the hospital. This information is not intended to replace advice given to you by your health care provider. Make sure you discuss any questions you have with your health care provider. Document Revised: 08/12/2022 Document Reviewed: 08/12/2022 Elsevier Patient Education  2024 [employer].

## 2024-05-01 NOTE — Progress Notes (Signed)
 Subjective:    Patient ID: Tiffany Mckenzie, female    DOB: Jan 10, 1956, 68 y.o.   MRN: [id number]  Chief Complaint  Patient presents with   Medical Management of Chronic Issues    Patient presents today for a 6 month follow-up   Quality Metric Gaps    Pneumonia,zoster    HPI Discussed the use of AI scribe software for clinical note transcription with the patient, who gave verbal consent to proceed.  History of Present Illness Tiffany Mckenzie is a 67 year old female who presents with persistent cough and allergies.  She has been experiencing a persistent cough since Thursday, which is particularly bothersome at night. The cough is characterized by episodes of coughing fits, sometimes producing green or clear sputum. No fever, chills, sore throat, headache, ear pain, chest pain, trouble breathing, or palpitations. Cough syrup with hydrocodone  provides relief, allowing her to rest.  She has a history of allergies and has been taking Claritin daily and Benadryl at night. She has not been using Flonase recently but has used it in the past. Albuterol  has been used before, but she is unsure of its effectiveness.  In the past year, she had the flu in February, which lasted two weeks and was self-managed at home. She experienced significant fatigue and took about two weeks to regain her energy.  She is currently on semaglutide  for diabetes management, which helps maintain her blood sugar levels, with recent readings between 95 and 105 mg/dL. She notes a decrease in her BMI over the past two years, from 54 to 19.  She has chronic low back pain, which has been persistent for years. She occasionally experiences tightness in the back of her legs upon waking. She did fall during her flu episode but did not feel her back got worse after that. No significant pain radiating down her legs, incontinence, or worsening of back pain post-fall.    Past Medical History:  Diagnosis Date   Allergic rhinitis     Allergic state 03/03/2015   Annual physical exam 02/09/2012   Anxiety state 09/30/2016   Arthritis    Asthma    Constipation 03/03/2015   Diabetes mellitus type 2 in obese 09/13/2012   History of chicken pox    History of hepatitis    age 33   Hyperglycemia 09/13/2012   Hypertension    Hypothyroidism    Left carotid bruit 03/11/2016   Obesity 03/28/2017   Overactive bladder 03/17/2013   Seeing urology   Raynaud phenomenon 03/03/2015   Type II or unspecified type diabetes mellitus without mention of complication, uncontrolled 09/13/2012    Past Surgical History:  Procedure Laterality Date   ABDOMINAL HYSTERECTOMY  2002   TONSILLECTOMY      Family History  Problem Relation Age of Onset   Alcohol abuse Father    Cancer Father        lung   Diabetes Father    Arthritis Mother    Heart disease Mother        Blockage & double bypass   Hypertension Mother    Diabetes Mother    Colon cancer Paternal Grandmother    Stroke Paternal Grandmother        maternal grandfather   Breast cancer Maternal Grandmother    Heart disease Maternal Grandfather    Scoliosis Daughter    Cancer Paternal Grandfather        lung    Social History   Socioeconomic History   Marital status:  Married    Spouse name: Not on file   Number of children: Not on file   Years of education: Not on file   Highest education level: Not on file  Occupational History   Not on file  Tobacco Use   Smoking status: Never   Smokeless tobacco: Never  Substance and Sexual Activity   Alcohol use: Yes    Comment: less than once a month, white wine or mixed drink   Drug use: No   Sexual activity: Not on file  Other Topics Concern   Not on file  Social History Narrative   Not on file   Social Drivers of Health   Financial Resource Strain: Low Risk  (07/05/2023)   Overall Financial Resource Strain (CARDIA)    Difficulty of Paying Living Expenses: Not very hard  Food Insecurity: No Food Insecurity (07/05/2023)    Hunger Vital Sign    Worried About Running Out of Food in the Last Year: Never true    Ran Out of Food in the Last Year: Never true  Transportation Needs: No Transportation Needs (07/05/2023)   PRAPARE - Administrator, Civil Service (Medical): No    Lack of Transportation (Non-Medical): No  Physical Activity: Inactive (07/05/2023)   Exercise Vital Sign    Days of Exercise per Week: 0 days    Minutes of Exercise per Session: 0 min  Stress: No Stress Concern Present (07/05/2023)   [employer] of Occupational Health - Occupational Stress Questionnaire    Feeling of Stress : Not at all  Social Connections: Moderately Integrated (07/05/2023)   Social Connection and Isolation Panel [NHANES]    Frequency of Communication with Friends and Family: More than three times a week    Frequency of Social Gatherings with Friends and Family: More than three times a week    Attends Religious Services: More than 4 times per year    Active Member of [employer] or Organizations: No    Attends Banker Meetings: Never    Marital Status: Married  Catering manager Violence: Not At Risk (07/05/2023)   Humiliation, Afraid, Rape, and Kick questionnaire    Fear of Current or Ex-Partner: No    Emotionally Abused: No    Physically Abused: No    Sexually Abused: No    Outpatient Medications Prior to Visit  Medication Sig Dispense Refill   levothyroxine  (SYNTHROID ) 100 MCG tablet TAKE ONE TABLET DAILY BEFORE BREAKFAST 6 DAYS A WEEK. 90 tablet 1   loratadine (CLARITIN) 10 MG tablet Take 10 mg by mouth daily.     LORazepam  (ATIVAN ) 0.5 MG tablet TAKE 1 TABLET BY MOUTH TWICE A DAY AS NEEDED 60 tablet 1   metFORMIN  (GLUCOPHAGE ) 500 MG tablet Take 1 tablet (500 mg total) by mouth 2 (two) times daily with a meal. 90 tablet 1   olmesartan  (BENICAR ) 20 MG tablet TAKE 1/2 TABLET BY MOUTH DAILY 45 tablet 1   Semaglutide , 2 MG/DOSE, (OZEMPIC , 2 MG/DOSE,) 8 MG/3ML SOPN INJECT 2MG  AS DIRECTED ONCE A WEEK  3 mL 5   triamcinolone  cream (KENALOG ) 0.1 % Apply 1 Application topically 2 (two) times daily as needed (rash). 80 g 1   fluticasone (FLOVENT  HFA) 110 MCG/ACT inhaler Inhale 2 puffs into the lungs in the morning and at bedtime. Rinse mouth out after use. 1 each 1   No facility-administered medications prior to visit.    Allergies  Allergen Reactions   Codeine Nausea And Vomiting    vomiting  Clarithromycin Rash    Review of Systems  Constitutional:  Negative for fever and malaise/fatigue.  HENT:  Negative for congestion.   Eyes:  Negative for blurred vision.  Respiratory:  Positive for cough. Negative for sputum production and shortness of breath.   Cardiovascular:  Negative for chest pain, palpitations and leg swelling.  Gastrointestinal:  Negative for abdominal pain, blood in stool and nausea.  Genitourinary:  Negative for dysuria and frequency.  Musculoskeletal:  Positive for back pain. Negative for falls.  Skin:  Negative for rash.  Neurological:  Negative for dizziness, loss of consciousness and headaches.  Endo/Heme/Allergies:  Negative for environmental allergies.  Psychiatric/Behavioral:  Negative for depression. The patient is not nervous/anxious.        Objective:     Physical Exam Constitutional:      General: She is not in acute distress.    Appearance: Normal appearance. She is well-developed. She is not toxic-appearing.  HENT:     Head: Normocephalic and atraumatic.     Right Ear: External ear normal.     Left Ear: External ear normal.     Nose: Nose normal.  Eyes:     General:        Right eye: No discharge.        Left eye: No discharge.     Conjunctiva/sclera: Conjunctivae normal.  Neck:     Thyroid : No thyromegaly.  Cardiovascular:     Rate and Rhythm: Normal rate and regular rhythm.     Heart sounds: Normal heart sounds. No murmur heard. Pulmonary:     Effort: Pulmonary effort is normal. No respiratory distress.     Breath sounds: Normal  breath sounds.  Abdominal:     General: Bowel sounds are normal.     Palpations: Abdomen is soft.     Tenderness: There is no abdominal tenderness. There is no guarding.  Musculoskeletal:        General: Normal range of motion.     Cervical back: Neck supple.  Lymphadenopathy:     Cervical: No cervical adenopathy.  Skin:    General: Skin is warm and dry.  Neurological:     Mental Status: She is alert and oriented to person, place, and time.  Psychiatric:        Mood and Affect: Mood normal.        Behavior: Behavior normal.        Thought Content: Thought content normal.        Judgment: Judgment normal.     BP 128/66   Pulse 82   Temp 98.3 F (36.8 C)   Resp 16   Ht 5\' 2"  (1.575 m)   Wt 148 lb 9.6 oz (67.4 kg)   SpO2 95%   BMI 27.18 kg/m  Wt Readings from Last 3 Encounters:  05/01/24 148 lb 9.6 oz (67.4 kg)  11/01/23 154 lb 2 oz (69.9 kg)  04/20/23 151 lb 3.2 oz (68.6 kg)    Diabetic Foot Exam - Simple   No data filed    Lab Results  Component Value Date   WBC 7.2 04/20/2023   HGB 12.8 04/20/2023   HCT 37.1 04/20/2023   PLT 217.0 04/20/2023   GLUCOSE 136 (H) 11/01/2023   CHOL 123 04/20/2023   TRIG 76.0 04/20/2023   HDL 45.70 04/20/2023   LDLCALC 62 04/20/2023   ALT 10 11/01/2023   AST 13 11/01/2023   NA 138 11/01/2023   K 3.9 11/01/2023   CL 102 11/01/2023  CREATININE 0.85 11/01/2023   BUN 14 11/01/2023   CO2 29 11/01/2023   TSH 1.29 04/20/2023   HGBA1C 5.8 11/01/2023   MICROALBUR <0.7 11/01/2023    Lab Results  Component Value Date   TSH 1.29 04/20/2023   Lab Results  Component Value Date   WBC 7.2 04/20/2023   HGB 12.8 04/20/2023   HCT 37.1 04/20/2023   MCV 91.3 04/20/2023   PLT 217.0 04/20/2023   Lab Results  Component Value Date   NA 138 11/01/2023   K 3.9 11/01/2023   CO2 29 11/01/2023   GLUCOSE 136 (H) 11/01/2023   BUN 14 11/01/2023   CREATININE 0.85 11/01/2023   BILITOT 0.6 11/01/2023   ALKPHOS 82 11/01/2023   AST 13  11/01/2023   ALT 10 11/01/2023   PROT 6.2 11/01/2023   ALBUMIN 4.0 11/01/2023   CALCIUM 8.9 11/01/2023   GFR 69.83 11/01/2023   Lab Results  Component Value Date   CHOL 123 04/20/2023   Lab Results  Component Value Date   HDL 45.70 04/20/2023   Lab Results  Component Value Date   LDLCALC 62 04/20/2023   Lab Results  Component Value Date   TRIG 76.0 04/20/2023   Lab Results  Component Value Date   CHOLHDL 3 04/20/2023   Lab Results  Component Value Date   HGBA1C 5.8 11/01/2023       Assessment & Plan:  B12 deficiency Assessment & Plan: Supplement and monitor   Orders: -     Vitamin B12  Hyperlipidemia associated with type 2 diabetes mellitus (HCC) Assessment & Plan: Encourage heart healthy diet such as MIND or DASH diet, increase exercise, avoid trans fats, simple carbohydrates and processed foods, consider a krill or fish or flaxseed oil cap daily.    Orders: -     Comprehensive metabolic panel with GFR -     CBC with Differential/Platelet -     Lipid panel  Hypothyroidism, unspecified type Assessment & Plan: On Levothyroxine , continue to monitor   Orders: -     TSH  Muscle cramps Assessment & Plan: Hydrate and monitor    Type 2 diabetes mellitus with obesity (HCC) Assessment & Plan: hgba1c acceptable, minimize simple carbs. Increase exercise as tolerated. Continue current meds is tolerating Ozempic  and is given refilll  Orders: -     Hemoglobin A1c -     Comprehensive metabolic panel with GFR -     CBC with Differential/Platelet  High risk medication use -     Drug Monitoring Panel [phone] , Urine  Low back pain, unspecified back pain laterality, unspecified chronicity, unspecified whether sciatica present -     DG Lumbar Spine Complete; Future  Other orders -     HYDROcodone  Bit-Homatrop MBr; Take 5 mLs by mouth every 6 (six) hours as needed for cough.  Dispense: 160 mL; Refill: 0    Assessment and Plan Assessment & Plan Persistent  cough Likely allergy-related. Current management includes Claritin and Benadryl. Hydromet syrup effective in past. - Restart Flonase nasal spray. - Increase Claritin to twice daily for one week if experiencing itchy, watery eyes or rhinorrhea. - Prescribe Hydromet syrup for nocturnal cough. - Report escalation of symptoms such as fever or worsening cough.  Allergic rhinitis Exacerbated by pollen, contributing to cough. Current management includes Claritin and Benadryl. - Restart Flonase nasal spray. - Increase Claritin to twice daily for one week if experiencing itchy, watery eyes or rhinorrhea.  Type 2 diabetes mellitus with obesity Well-controlled with  semaglutide  and metformin . Improved BMI and energy. Semaglutide  reduces food cravings. - Continue semaglutide  and metformin  as prescribed. - Monitor blood glucose levels regularly.  Hypothyroidism Managed with levothyroxine . - Continue levothyroxine  as prescribed. - Check thyroid  function tests with upcoming lab work.  Hyperlipidemia Associated with type 2 diabetes mellitus. - Check lipid panel with upcoming lab work.      Randie Bustle, MD

## 2024-05-02 ENCOUNTER — Ambulatory Visit: Payer: Self-pay | Admitting: Family Medicine

## 2024-05-02 ENCOUNTER — Ambulatory Visit: Payer: Medicare HMO | Admitting: Family Medicine

## 2024-05-02 LAB — TSH: TSH: 1.3 u[IU]/mL (ref 0.35–5.50)

## 2024-05-02 LAB — VITAMIN B12: Vitamin B-12: 479 pg/mL (ref 211–911)

## 2024-05-03 LAB — DRUG MONITORING PANEL 376104, URINE
Amphetamines: NEGATIVE ng/mL (ref <500)
Barbiturates: NEGATIVE ng/mL (ref <300)
Benzodiazepines: NEGATIVE ng/mL (ref <100)
Cocaine Metabolite: NEGATIVE ng/mL (ref <150)
Desmethyltramadol: NEGATIVE ng/mL (ref <100)
Opiates: NEGATIVE ng/mL (ref <100)
Oxycodone: NEGATIVE ng/mL (ref <100)
Tramadol: NEGATIVE ng/mL (ref <100)

## 2024-05-03 LAB — DM TEMPLATE

## 2024-05-09 ENCOUNTER — Other Ambulatory Visit: Payer: Self-pay | Admitting: Family Medicine

## 2024-05-09 DIAGNOSIS — M545 Low back pain, unspecified: Secondary | ICD-10-CM

## 2024-06-07 ENCOUNTER — Encounter: Payer: Self-pay | Admitting: Pharmacist

## 2024-06-07 NOTE — Progress Notes (Signed)
 Pharmacy Quality Measure Review  This patient is appearing on a report for being at risk of failing the adherence measure for diabetes medications this calendar year.   Medication: Ozempic  2mg  Last fill date: 04/07/2024 for 28 day supply per adherence report however reviewed most recent refill history and patient filled Ozempic  05/22 for 28 day supply and again on 6/21 for 28 day supply (but not showing that she has picked up the 6/21 refill yet)    Statin Use in Persons with Diabetes (SUPD) She is also not currently taking a statin. Unable to find past history of statin use. Her last LDL was < 100 and last A1c was 6.0%  AHA PREVENT 10 year ASCVD risk calculation = 10.4%    Lab Results  Component Value Date   CHOL 149 05/01/2024   HDL 43.70 05/01/2024   LDLCALC 86 05/01/2024   TRIG 99.0 05/01/2024   CHOLHDL 3 05/01/2024    Assessment:  Medication adherence - Insurance report was not up to date. No action needed at this time.  Primary ASCVD prevention - patient has ASVD risk calculation > 7.5% and type 2 DM. Statin therapy recommended since LDL is > 70. Consider starting moderate to high intensity statin - rosuvastatin 10mg , atorvastatin 20mg , pravastatin 40mg  or simvastatin 40mg    Madelin Ray, PharmD Clinical Pharmacist [facility]

## 2024-06-09 NOTE — Progress Notes (Signed)
 Called patient and she would like to wait to discuss at her appointment in November. She reports having a lot going on with her husband.

## 2024-06-27 ENCOUNTER — Ambulatory Visit

## 2024-06-30 ENCOUNTER — Other Ambulatory Visit: Payer: Self-pay | Admitting: Family Medicine

## 2024-07-26 ENCOUNTER — Other Ambulatory Visit: Payer: Self-pay | Admitting: Family Medicine

## 2024-07-31 ENCOUNTER — Telehealth: Admitting: Physician Assistant

## 2024-07-31 DIAGNOSIS — R3989 Other symptoms and signs involving the genitourinary system: Secondary | ICD-10-CM | POA: Diagnosis not present

## 2024-07-31 MED ORDER — CEPHALEXIN 500 MG PO CAPS: 500.0000 mg | ORAL_CAPSULE | Freq: Two times a day (BID) | ORAL | 0 refills | Status: DC

## 2024-07-31 NOTE — Progress Notes (Signed)
 E-Visit for Urinary Problems  We are sorry that you are not feeling well.  Here is how we plan to help!  Based on what you shared with me it looks like you most likely have a simple urinary tract infection.  A UTI (Urinary Tract Infection) is a bacterial infection of the bladder.  Most cases of urinary tract infections are simple to treat but a key part of your care is to encourage you to drink plenty of fluids and watch your symptoms carefully.  I have prescribed Keflex  500 mg twice a day for 7 days.  Your symptoms should gradually improve. Call us  if the burning in your urine worsens, you develop worsening fever, back pain or pelvic pain or if your symptoms do not resolve after completing the antibiotic.  Urinary tract infections can be prevented by drinking plenty of water to keep your body hydrated.  Also be sure when you wipe, wipe from front to back and don't hold it in!  If possible, empty your bladder every 4 hours.  HOME CARE Drink plenty of fluids Compete the full course of the antibiotics even if the symptoms resolve Remember, when you need to go.go. Holding in your urine can increase the likelihood of getting a UTI! GET HELP RIGHT AWAY IF: You cannot urinate You get a high fever Worsening back pain occurs You see blood in your urine You feel sick to your stomach or throw up You feel like you are going to pass out  MAKE SURE YOU  Understand these instructions. Will watch your condition. Will get help right away if you are not doing well or get worse.   Thank you for choosing an e-visit.  Your e-visit answers were reviewed by a board certified advanced clinical practitioner to complete your personal care plan. Depending upon the condition, your plan could have included both over the counter or prescription medications.  Please review your pharmacy choice. Make sure the pharmacy is open so you can pick up prescription now. If there is a problem, you may contact your  provider through [employer] and have the prescription routed to another pharmacy.  Your safety is important to us . If you have drug allergies check your prescription carefully.   For the next 24 hours you can use MyChart to ask questions about today's visit, request a non-urgent call back, or ask for a work or school excuse. You will get an email in the next two days asking about your experience. I hope that your e-visit has been valuable and will speed your recovery.  I have spent 5 minutes in review of e-visit questionnaire, review and updating patient chart, medical decision making and response to patient.   Delon CHRISTELLA Dickinson, PA-C

## 2024-08-03 DIAGNOSIS — Z1231 Encounter for screening mammogram for malignant neoplasm of breast: Secondary | ICD-10-CM | POA: Diagnosis not present

## 2024-08-17 ENCOUNTER — Ambulatory Visit (INDEPENDENT_AMBULATORY_CARE_PROVIDER_SITE_OTHER): Admitting: *Deleted

## 2024-08-17 VITALS — Ht 62.0 in | Wt 148.0 lb

## 2024-08-17 DIAGNOSIS — Z0000 Encounter for general adult medical examination without abnormal findings: Secondary | ICD-10-CM | POA: Diagnosis not present

## 2024-08-17 NOTE — Progress Notes (Signed)
 Subjective:   Tiffany Mckenzie is a 68 y.o. who presents for a Medicare Wellness preventive visit.  As a reminder, Annual Wellness Visits don't include a physical exam, and some assessments may be limited, especially if this visit is performed virtually. We may recommend an in-person follow-up visit with your provider if needed.  Visit Complete: Virtual I connected with  Tiffany Mckenzie on 08/17/24 by a audio enabled telemedicine application and verified that I am speaking with the correct person using two identifiers.  Patient Location: Home  Provider Location: Office/Clinic  I discussed the limitations of evaluation and management by telemedicine. The patient expressed understanding and agreed to proceed.  Vital Signs: Because this visit was a virtual/telehealth visit, some criteria may be missing or patient reported. Any vitals not documented were not able to be obtained and vitals that have been documented are patient reported.  VideoDeclined- This patient declined Librarian, academic. Therefore the visit was completed with audio only.  Persons Participating in Visit: Patient.  AWV Questionnaire: No: Patient Medicare AWV questionnaire was not completed prior to this visit.  Cardiac Risk Factors include: advanced age (>88men, >26 women);diabetes mellitus;dyslipidemia;hypertension;Other (see comment), Risk factor comments: asthma     Objective:    Today's Vitals   08/17/24 1025  Weight: 148 lb (67.1 kg)  Height: 5' 2 (1.575 m)   Body mass index is 27.07 kg/m.     08/17/2024   10:39 AM 07/05/2023   10:27 AM 03/25/2017   10:39 AM 12/04/2014    9:54 AM  Advanced Directives  Does Patient Have a Medical Advance Directive? Yes Yes Yes  No   Type of Estate agent of [place];Living will Healthcare Power of [place];Living will Healthcare Power of [place];Living will   Does patient want to make changes to medical advance directive? No -  Patient declined  No - Patient declined    Copy of Healthcare Power of Attorney in Chart? No - copy requested No - copy requested No - copy requested    Would patient like information on creating a medical advance directive?    No - patient declined information      Data saved with a previous flowsheet row definition    Current Medications (verified) Outpatient Encounter Medications as of 08/17/2024  Medication Sig   levothyroxine  (SYNTHROID ) 100 MCG tablet TAKE ONE TABLET DAILY BEFORE BREAKFAST 6 DAYS A WEEK.   loratadine (CLARITIN) 10 MG tablet Take 10 mg by mouth daily.   LORazepam  (ATIVAN ) 0.5 MG tablet TAKE 1 TABLET BY MOUTH TWICE A DAY AS NEEDED   olmesartan  (BENICAR ) 20 MG tablet TAKE 1/2 TABLET BY MOUTH DAILY   oxybutynin (DITROPAN) 5 MG tablet Take 5 mg by mouth daily.   Semaglutide , 2 MG/DOSE, (OZEMPIC , 2 MG/DOSE,) 8 MG/3ML SOPN Inject 2 mg into the skin once a week.   triamcinolone  cream (KENALOG ) 0.1 % Apply 1 Application topically 2 (two) times daily as needed (rash).   vitamin B-12 (CYANOCOBALAMIN ) 500 MCG tablet Take 500 mcg by mouth daily.   metFORMIN  (GLUCOPHAGE ) 500 MG tablet Take 1 tablet (500 mg total) by mouth 2 (two) times daily with a meal. (Patient not taking: Reported on 08/17/2024)   [DISCONTINUED] cephALEXin  (KEFLEX ) 500 MG capsule Take 1 capsule (500 mg total) by mouth 2 (two) times daily. (Patient not taking: Reported on 08/17/2024)   [DISCONTINUED] HYDROcodone  bit-homatropine (HYDROMET) 5-1.5 MG/5ML syrup Take 5 mLs by mouth every 6 (six) hours as needed for cough.  No facility-administered encounter medications on file as of 08/17/2024.    Allergies (verified) Codeine and Clarithromycin   History: Past Medical History:  Diagnosis Date   Allergic rhinitis    Allergic state 03/03/2015   Annual physical exam 02/09/2012   Anxiety state 09/30/2016   Arthritis    Asthma    Constipation 03/03/2015   Diabetes mellitus type 2 in obese 09/13/2012   History of chicken  pox    History of hepatitis    age 36   Hyperglycemia 09/13/2012   Hypertension    Hypothyroidism    Left carotid bruit 03/11/2016   Obesity 03/28/2017   Overactive bladder 03/17/2013   Seeing urology   Raynaud phenomenon 03/03/2015   Type II or unspecified type diabetes mellitus without mention of complication, uncontrolled 09/13/2012   Past Surgical History:  Procedure Laterality Date   ABDOMINAL HYSTERECTOMY  2002   TONSILLECTOMY     Family History  Problem Relation Age of Onset   Alcohol abuse Father    Cancer Father        lung   Diabetes Father    Arthritis Mother    Heart disease Mother        Blockage & double bypass   Hypertension Mother    Diabetes Mother    Colon cancer Paternal Grandmother    Stroke Paternal Grandmother        maternal grandfather   Breast cancer Maternal Grandmother    Heart disease Maternal Grandfather    Scoliosis Daughter    Cancer Paternal Grandfather        lung   Social History   Socioeconomic History   Marital status: Married    Spouse name: Not on file   Number of children: Not on file   Years of education: Not on file   Highest education level: Not on file  Occupational History   Not on file  Tobacco Use   Smoking status: Never   Smokeless tobacco: Never  Substance and Sexual Activity   Alcohol use: Yes    Comment: less than once a month, white wine or mixed drink   Drug use: No   Sexual activity: Not on file  Other Topics Concern   Not on file  Social History Narrative   Not on file   Social Drivers of Health   Financial Resource Strain: Low Risk  (08/17/2024)   Overall Financial Resource Strain (CARDIA)    Difficulty of Paying Living Expenses: Not very hard  Food Insecurity: No Food Insecurity (08/17/2024)   Hunger Vital Sign    Worried About Running Out of Food in the Last Year: Never true    Ran Out of Food in the Last Year: Never true  Transportation Needs: No Transportation Needs (08/17/2024)   PRAPARE -  Administrator, Civil Service (Medical): No    Lack of Transportation (Non-Medical): No  Physical Activity: Inactive (08/17/2024)   Exercise Vital Sign    Days of Exercise per Week: 0 days    Minutes of Exercise per Session: 0 min  Stress: No Stress Concern Present (08/17/2024)   [employer] of Occupational Health - Occupational Stress Questionnaire    Feeling of Stress: Not at all  Social Connections: Moderately Integrated (08/17/2024)   Social Connection and Isolation Panel    Frequency of Communication with Friends and Family: More than three times a week    Frequency of Social Gatherings with Friends and Family: More than three times a week  Attends Religious Services: More than 4 times per year    Active Member of Clubs or Organizations: No    Attends Banker Meetings: Never    Marital Status: Married    Tobacco Counseling Counseling given: Not Answered    Clinical Intake:     Pain : No/denies pain     BMI - recorded: 27.07 Nutritional Status: BMI 25 -29 Overweight Nutritional Risks: None Diabetes: Yes CBG done?: No  Lab Results  Component Value Date   HGBA1C 6.0 05/01/2024   HGBA1C 5.8 11/01/2023   HGBA1C 5.8 04/20/2023     How often do you need to have someone help you when you read instructions, pamphlets, or other written materials from your doctor or pharmacy?: 1 - Never  Interpreter Needed?: No  Information entered by :: Lolita Libra, CMA   Activities of Daily Living     08/17/2024   10:32 AM  In your present state of health, do you have any difficulty performing the following activities:  Hearing? 0  Vision? 0  Difficulty concentrating or making decisions? 0  Walking or climbing stairs? 0  Dressing or bathing? 0  Doing errands, shopping? 0  Preparing Food and eating ? N  Using the Toilet? N  In the past six months, have you accidently leaked urine? N  Comment Takes oxybutynin  Do you have problems with loss of  bowel control? N  Managing your Medications? N  Managing your Finances? N  Housekeeping or managing your Housekeeping? N    Patient Care Team: Domenica Harlene LABOR, MD as PCP - General (Family Medicine) Genette Charlie GAILS, MD (Obstetrics and Gynecology) Tamala Lamar PARAS, MD as Consulting Physician (Ophthalmology) Timm Clem PARAS., MD (Gastroenterology) Renate Lynwood Hussar, MD as Referring Physician  I have updated your Care Teams any recent Medical Services you may have received from other providers in the past year.     Assessment:   This is a routine wellness examination for Tiffany Mckenzie.  Hearing/Vision screen Hearing Screening - Comments:: Denies hearing difficulties.  Vision Screening - Comments:: Up to date with eye exams at [facility] / Dr Renate   Goals Addressed               This Visit's Progress     Patient Stated (pt-stated)        To begin walking 2-3 days 30 min a day.       Depression Screen     08/17/2024   10:38 AM 11/01/2023    2:19 PM 07/05/2023   10:27 AM 04/20/2023   10:35 AM 10/19/2022    2:15 PM 04/16/2022    1:22 PM 03/25/2021    9:31 AM  PHQ 2/9 Scores  PHQ - 2 Score 0 0 0 0 0 0 0  PHQ- 9 Score 1   0 0      Fall Risk     08/17/2024   10:41 AM 11/01/2023    2:19 PM 07/05/2023   10:24 AM 04/20/2023   10:36 AM 10/19/2022    2:14 PM  Fall Risk   Falls in the past year? 0 0 0 0 0  Number falls in past yr: 0 0 0 0 0  Injury with Fall? 0 0 0 0 0  Risk for fall due to : No Fall Risks No Fall Risks No Fall Risks    Follow up Education provided Falls evaluation completed Falls evaluation completed Falls evaluation completed Falls evaluation completed  Data saved with a previous flowsheet row definition    MEDICARE RISK AT HOME:  Medicare Risk at Home Any stairs in or around the home?: Yes If so, are there any without handrails?: No Home free of loose throw rugs in walkways, pet beds, electrical cords, etc?: Yes Adequate lighting in your home to  reduce risk of falls?: Yes Life alert?: No Use of a cane, walker or w/c?: No Grab bars in the bathroom?: No Shower chair or bench in shower?: No Elevated toilet seat or a handicapped toilet?: Yes  TIMED UP AND GO:  Was the test performed?  No,audio  Cognitive Function: 6CIT completed        08/17/2024   10:39 AM 07/05/2023   10:28 AM  6CIT Screen  What Year? 0 points 0 points  What month? 0 points 0 points  What time? 0 points 0 points  Count back from 20 0 points 0 points  Months in reverse 0 points 0 points  Repeat phrase 0 points 0 points  Total Score 0 points 0 points    Immunizations Immunization History  Administered Date(s) Administered   Influenza Split 09/14/2012   Influenza,inj,Quad PF,6+ Mos 11/06/2013, 08/29/2015   Pneumococcal Conjugate-13 11/06/2013   Tdap 12/04/2014   Zoster, Live 03/10/2016    Screening Tests Health Maintenance  Topic Date Due   Diabetic kidney evaluation - Urine ACR  Never done   Medicare Annual Wellness (AWV)  07/04/2024   INFLUENZA VACCINE  07/14/2024   COVID-19 Vaccine (1) 12/13/2024 (Originally 04/07/1961)   Pneumococcal Vaccine: 50+ Years (2 of 2 - PPSV23, PCV20, or PCV21) 08/17/2025 (Originally 01/01/2014)   Zoster Vaccines- Shingrix (1 of 2) 08/17/2025 (Originally [birth year]06/1973)   OPHTHALMOLOGY EXAM  09/19/2024   FOOT EXAM  10/31/2024   HEMOGLOBIN A1C  11/01/2024   DTaP/Tdap/Td (2 - Td or Tdap) 12/04/2024   Diabetic kidney evaluation - eGFR measurement  05/01/2025   MAMMOGRAM  08/01/2025   Colonoscopy  03/27/2031   DEXA SCAN  Completed   Hepatitis C Screening  Completed   HPV VACCINES  Aged Out   Meningococcal B Vaccine  Aged Out    Health Maintenance  Health Maintenance Due  Topic Date Due   Diabetic kidney evaluation - Urine ACR  Never done   Medicare Annual Wellness (AWV)  07/04/2024   INFLUENZA VACCINE  07/14/2024   Health Maintenance Items Addressed: Declines shingles and pneumonia vaccines. Will do MALB at next  office visit. Considering getting flu vaccine this year.  Additional Screening:  Vision Screening: Recommended annual ophthalmology exams for early detection of glaucoma and other disorders of the eye. Would you like a referral to an eye doctor? No    Dental Screening: Recommended annual dental exams for proper oral hygiene  Community Resource Referral / Chronic Care Management: CRR required this visit?  No   CCM required this visit?  No   Plan:    I have personally reviewed and noted the following in the patient's chart:   Medical and social history Use of alcohol, tobacco or illicit drugs  Current medications and supplements including opioid prescriptions. Patient is not currently taking opioid prescriptions. Functional ability and status Nutritional status Physical activity Advanced directives List of other physicians Hospitalizations, surgeries, and ER visits in previous 12 months Vitals Screenings to include cognitive, depression, and falls Referrals and appointments  In addition, I have reviewed and discussed with patient certain preventive protocols, quality metrics, and best practice recommendations. A written personalized care plan for  preventive services as well as general preventive health recommendations were provided to patient.   Lolita Libra, CMA   08/17/2024   After Visit Summary: (MyChart) Due to this being a telephonic visit, the after visit summary with patients personalized plan was offered to patient via MyChart   Notes: Nothing significant to report at this time.

## 2024-08-17 NOTE — Patient Instructions (Signed)
 Tiffany Mckenzie , Thank you for taking time out of your busy schedule to complete your Annual Wellness Visit with me. I enjoyed our conversation and look forward to speaking with you again next year. I, as well as your care team,  appreciate your ongoing commitment to your health goals. Please review the following plan we discussed and let me know if I can assist you in the future. Your Game plan/ To Do List    I hope all goes well with your husband's surgery! I wish you success on starting a walking regimen after he recuperates.  Follow up Visits: Next Medicare AWV with our clinical staff: 08/21/25 10:20am, telephone    Next Office Visit with your provider: 11/02/24 11:20am, Dr Domenica  Clinician Recommendations:  Aim for 30 minutes of exercise or brisk walking, 6-8 glasses of water, and 5 servings of fruits and vegetables each day.   You will need to get the following vaccines at your local pharmacy:  FLU    This is a list of the screening recommended for you and due dates:  Health Maintenance  Topic Date Due   Yearly kidney health urinalysis for diabetes  Never done   Medicare Annual Wellness Visit  07/04/2024   Flu Shot  07/14/2024   COVID-19 Vaccine (1) 12/13/2024*   Pneumococcal Vaccine for age over 18 (2 of 2 - PPSV23, PCV20, or PCV21) 08/17/2025*   Zoster (Shingles) Vaccine (1 of 2) 08/17/2025*   Eye exam for diabetics  09/19/2024   Complete foot exam   10/31/2024   Hemoglobin A1C  11/01/2024   DTaP/Tdap/Td vaccine (2 - Td or Tdap) 12/04/2024   Yearly kidney function blood test for diabetes  05/01/2025   Mammogram  08/01/2025   Colon Cancer Screening  03/27/2031   DEXA scan (bone density measurement)  Completed   Hepatitis C Screening  Completed   HPV Vaccine  Aged Out   Meningitis B Vaccine  Aged Out  *Topic was postponed. The date shown is not the original due date.    Bring a copy of your health care power of attorney and living will to the office to be added to your chart at  your convenience. You can mail a copy to [facility] [address] [place], [place] [address] or email to [email or web address]  Advanced directives:  Advance Care Planning is important because it:  [x]  Makes sure you receive the medical care that is consistent with your values, goals, and preferences  [x]  It provides guidance to your family and loved ones and reduces their decisional burden about whether or not they are making the right decisions based on your wishes.  Follow the link provided in your after visit summary or read over the paperwork we have mailed to you to help you started getting your Advance Directives in place. If you need assistance in completing these, please reach out to us  so that we can help you!  See attachments for Preventive Care and Fall Prevention Tips.

## 2024-08-31 ENCOUNTER — Other Ambulatory Visit: Payer: Self-pay | Admitting: Family Medicine

## 2024-10-09 NOTE — Progress Notes (Addendum)
 Tiffany Mckenzie                                          MRN: [id number]   10/09/2024   The [facility]BCI Quality Team Specialist reviewed this patient medical record for the purposes of chart review for care gap closure. The following were reviewed: chart review for care gap closure-kidney health evaluation for diabetes:eGFR  and uACR.  01/16/2025- uacr never performed.    VBCI Quality Team

## 2024-11-01 ENCOUNTER — Encounter: Payer: Self-pay | Admitting: Pharmacist

## 2024-11-01 NOTE — Progress Notes (Signed)
 Pharmacy Quality Measure Review  This patient is appearing on the insurance-providing list for being at risk of failing the adherence measure for Statin Use in Persons with Diabetes (SUPD) medications this calendar year.   She has been taking Ozmepic 2mg  weekly - last filled 10/14/2024 for 28 day supply.   Lab Results  Component Value Date   HGBA1C 6.0 05/01/2024    Patient is not currently taking a statin. Unable to find past history of statin use.  Lab Results  Component Value Date   CHOL 149 05/01/2024   HDL 43.70 05/01/2024   LDLCALC 86 05/01/2024   TRIG 99.0 05/01/2024   CHOLHDL 3 05/01/2024     AHA PREVENT 10 year ASCVD risk calculation = 7.7%    Assessment:  Primary ASCVD prevention - patient has ASVD risk calculation > 7.5% and type 2 DM. Statin therapy is recommended since based on Prevent calculation and LDL is > 70.  Consider starting moderate to high intensity statin - rosuvastatin 10mg , atorvastatin 20mg , pravastatin 40mg  or simvastatin 40mg .    Madelin Ray, PharmD Clinical Pharmacist [facility]

## 2024-11-02 ENCOUNTER — Ambulatory Visit: Admitting: Family Medicine

## 2024-11-02 ENCOUNTER — Ambulatory Visit: Admitting: Student

## 2024-11-06 NOTE — Assessment & Plan Note (Signed)
 Managed with Ativan  as needed . Stable on current medications. Uses Ativan  sparingly.

## 2024-11-06 NOTE — Assessment & Plan Note (Signed)
 Managed with oxybutynin.  Stable on current medication.

## 2024-11-06 NOTE — Progress Notes (Unsigned)
 Subjective:     Patient ID: Tiffany Mckenzie, female    DOB: [birth year], 68 y.o.   MRN: [id number]  No chief complaint on file.   HPI  Discussed the use of AI scribe software for clinical note transcription with the patient, who gave verbal consent to proceed.  History of Present Illness Tiffany Mckenzie is a 68 year old female with type 2 diabetes who presents for follow-up regarding urinary issues and diabetes management.  She reports urinary urgency and frequency without dysuria. She self-started cephalexin  this week (two pills on Monday, two on Tuesday, one today) with improvement in burning but persistent urinary frequency. She has had a couple of UTIs in the past year but does not usually have frequent infections.  She monitors fasting blood sugar daily, typically 100-112 mg/dL. She stopped metformin  several months ago.   Diabetes: - Checking glucose at home: yes -Home BS: 100-111 morning sugars - Medications:  Semaglutide  2 mg weekly, she has stopped taking Metformin  - Compliant with medications - Denies symptoms of hypoglycemia, polyuria, polydipsia, numbness extremities, foot ulcers/trauma, visual changes, wounds that are not healing, medication side effects   Wt Readings from Last 3 Encounters:  11/08/24 156 lb 3.2 oz (70.9 kg)  08/17/24 148 lb (67.1 kg)  05/01/24 148 lb 9.6 oz (67.4 kg)     HTN Olmesartan  20 mg daily  Anxiety Lorazepam  (Ativan ) 0.5 mg twice daily as needed  Hypothyroidism-Levothyroxine  100 mcg daily  OAB Oxybutynin 5 mg daily  Vitamin B12 deficiency-500 mcg vitamin B12 daily  Patient denies fever, chills, SOB, CP, palpitations, dyspnea, edema, HA, vision changes, N/V/D, abdominal pain, urinary symptoms, rash, weight changes, and recent illness or hospitalizations.   History of Present Illness              Health Maintenance Due  Topic Date Due   Diabetic kidney evaluation - Urine ACR  Never done   HEMOGLOBIN A1C  11/01/2024    Past  Medical History:  Diagnosis Date   Allergic rhinitis    Allergic state 03/03/2015   Annual physical exam 02/09/2012   Anxiety state 09/30/2016   Arthritis    Asthma    Constipation 03/03/2015   Diabetes mellitus type 2 in obese 09/13/2012   History of chicken pox    History of hepatitis    age 56   Hyperglycemia 09/13/2012   Hypertension    Hypothyroidism    Left carotid bruit 03/11/2016   Obesity 03/28/2017   Overactive bladder 03/17/2013   Seeing urology   Raynaud phenomenon 03/03/2015   Type II or unspecified type diabetes mellitus without mention of complication, uncontrolled 09/13/2012    Past Surgical History:  Procedure Laterality Date   ABDOMINAL HYSTERECTOMY  2002   TONSILLECTOMY      Family History  Problem Relation Age of Onset   Alcohol abuse Father    Cancer Father        lung   Diabetes Father    Arthritis Mother    Heart disease Mother        Blockage & double bypass   Hypertension Mother    Diabetes Mother    Colon cancer Paternal Grandmother    Stroke Paternal Grandmother        maternal grandfather   Breast cancer Maternal Grandmother    Heart disease Maternal Grandfather    Scoliosis Daughter    Cancer Paternal Grandfather        lung    Social History  Socioeconomic History   Marital status: Married    Spouse name: Not on file   Number of children: Not on file   Years of education: Not on file   Highest education level: Not on file  Occupational History   Not on file  Tobacco Use   Smoking status: Never   Smokeless tobacco: Never  Substance and Sexual Activity   Alcohol use: Yes    Comment: less than once a month, white wine or mixed drink   Drug use: No   Sexual activity: Not on file  Other Topics Concern   Not on file  Social History Narrative   Not on file   Social Drivers of Health   Financial Resource Strain: Low Risk  (08/17/2024)   Overall Financial Resource Strain (CARDIA)    Difficulty of Paying Living Expenses: Not very  hard  Food Insecurity: No Food Insecurity (08/17/2024)   Hunger Vital Sign    Worried About Running Out of Food in the Last Year: Never true    Ran Out of Food in the Last Year: Never true  Transportation Needs: No Transportation Needs (08/17/2024)   PRAPARE - Administrator, Civil Service (Medical): No    Lack of Transportation (Non-Medical): No  Physical Activity: Inactive (08/17/2024)   Exercise Vital Sign    Days of Exercise per Week: 0 days    Minutes of Exercise per Session: 0 min  Stress: No Stress Concern Present (08/17/2024)   [employer] of Occupational Health - Occupational Stress Questionnaire    Feeling of Stress: Not at all  Social Connections: Moderately Integrated (08/17/2024)   Social Connection and Isolation Panel    Frequency of Communication with Friends and Family: More than three times a week    Frequency of Social Gatherings with Friends and Family: More than three times a week    Attends Religious Services: More than 4 times per year    Active Member of [employer] or Organizations: No    Attends Banker Meetings: Never    Marital Status: Married  Catering Manager Violence: Not At Risk (08/17/2024)   Humiliation, Afraid, Rape, and Kick questionnaire    Fear of Current or Ex-Partner: No    Emotionally Abused: No    Physically Abused: No    Sexually Abused: No    Outpatient Medications Prior to Visit  Medication Sig Dispense Refill   levothyroxine  (SYNTHROID ) 100 MCG tablet TAKE ONE TABLET DAILY BEFORE BREAKFAST 6 DAYS A WEEK. 90 tablet 1   loratadine (CLARITIN) 10 MG tablet Take 10 mg by mouth daily.     LORazepam  (ATIVAN ) 0.5 MG tablet TAKE 1 TABLET BY MOUTH TWICE A DAY AS NEEDED 60 tablet 1   olmesartan  (BENICAR ) 20 MG tablet TAKE 1/2 TABLET BY MOUTH DAILY 45 tablet 1   oxybutynin (DITROPAN) 5 MG tablet Take 5 mg by mouth daily.     triamcinolone  cream (KENALOG ) 0.1 % Apply 1 Application topically 2 (two) times daily as needed (rash). 80 g 1    vitamin B-12 (CYANOCOBALAMIN ) 500 MCG tablet Take 500 mcg by mouth daily.     metFORMIN  (GLUCOPHAGE ) 500 MG tablet Take 1 tablet (500 mg total) by mouth 2 (two) times daily with a meal. (Patient not taking: Reported on 11/08/2024) 90 tablet 1   Semaglutide , 2 MG/DOSE, (OZEMPIC , 2 MG/DOSE,) 8 MG/3ML SOPN Inject 2 mg into the skin once a week. 3 mL 3   No facility-administered medications prior to visit.  Allergies  Allergen Reactions   Codeine Nausea And Vomiting    vomiting   Clarithromycin Rash    ROS See HPI    Objective:    Physical Exam Constitutional:      General: She is not in acute distress.    Appearance: She is not ill-appearing, toxic-appearing or diaphoretic.  HENT:     Head: Normocephalic and atraumatic.     Right Ear: Tympanic membrane, ear canal and external ear normal.     Left Ear: Tympanic membrane, ear canal and external ear normal.     Nose: Nose normal. No congestion.     Mouth/Throat:     Mouth: Mucous membranes are moist.     Pharynx: Oropharynx is clear.  Eyes:     Extraocular Movements: Extraocular movements intact.     Right eye: Normal extraocular motion.     Left eye: Normal extraocular motion.     Conjunctiva/sclera: Conjunctivae normal.     Pupils: Pupils are equal, round, and reactive to light.  Neck:     Thyroid : No thyroid  mass or thyromegaly.     Vascular: No carotid bruit or JVD.  Cardiovascular:     Rate and Rhythm: Normal rate and regular rhythm.     Pulses: Normal pulses.          Radial pulses are 2+ on the right side and 2+ on the left side.       Dorsalis pedis pulses are 2+ on the right side and 2+ on the left side.     Heart sounds: Normal heart sounds, S1 normal and S2 normal. No murmur heard.    No friction rub. No gallop.  Pulmonary:     Effort: Pulmonary effort is normal. No respiratory distress.     Breath sounds: Normal breath sounds.  Abdominal:     General: Bowel sounds are normal. There is no distension.      Palpations: Abdomen is soft.     Tenderness: There is no abdominal tenderness. There is no guarding.  Musculoskeletal:        General: Normal range of motion.     Cervical back: Full passive range of motion without pain and normal range of motion. No edema or erythema.     Right lower leg: No edema.     Left lower leg: No edema.  Lymphadenopathy:     Cervical: No cervical adenopathy.  Skin:    General: Skin is warm and dry.     Capillary Refill: Capillary refill takes less than 2 seconds.  Neurological:     General: No focal deficit present.     Mental Status: She is alert and oriented to person, place, and time.     Cranial Nerves: No cranial nerve deficit.     Motor: No weakness.     Coordination: Coordination normal.     Gait: Gait normal.     Deep Tendon Reflexes: Reflexes normal.  Psychiatric:        Mood and Affect: Mood normal.        Behavior: Behavior normal.        Thought Content: Thought content normal.   Diabetic Foot Exam: Sensation intact bilaterally on monofilament testing. No skin breakdown, ulcers, or lesions noted. Pulses palpable with normal foot appearance.    BP 106/62   Pulse 76   Temp 97.7 F (36.5 C)   Ht 5' 2 (1.575 m)   Wt 156 lb 3.2 oz (70.9 kg)   SpO2 95%  BMI 28.57 kg/m  Wt Readings from Last 3 Encounters:  11/08/24 156 lb 3.2 oz (70.9 kg)  08/17/24 148 lb (67.1 kg)  05/01/24 148 lb 9.6 oz (67.4 kg)       Assessment & Plan:   Problem List Items Addressed This Visit     Annual physical exam - Primary   Patient encouraged to maintain heart healthy diet, regular exercise, adequate sleep. Consider daily probiotics. Take medications as prescribed.         Anxiety state   Managed with Ativan  as needed . Stable on current medications. Uses Ativan  sparingly.      B12 deficiency   Supplement and monitor.      Dysuria   Relevant Medications   nitrofurantoin , macrocrystal-monohydrate, (MACROBID ) 100 MG capsule   Other Relevant  Orders   POCT Urinalysis Dipstick (Automated) (Completed)   Urine Culture   Hyperlipidemia associated with type 2 diabetes mellitus (HCC)   Patient has ASCVD risk calculation > 7.5% and type 2 DM. Statin therapy recommended based on ASCVD and DM Dx. LDL goal  > 70.  Discussed ASCVD reisk score and potentially starting cholesterol lowering medication-Atorvastatin, Pt declines medications at this time. Offered coronary calcium CT scan, Pt declines at this time.       Relevant Medications   tirzepatide  (MOUNJARO ) 2.5 MG/0.5ML Pen   Other Relevant Orders   Comprehensive metabolic panel with GFR   Lipid panel   Hypothyroidism   On levothyroxine , continue to monitor.      Relevant Orders   TSH   Overactive bladder   Managed with oxybutynin.  Stable on current medication.      Primary hypertension   Well controlled, no changes to meds. Encouraged heart healthy diet such as the DASH diet and exercise as tolerated.        Relevant Orders   CBC with Differential/Platelet   Type 2 diabetes mellitus without complication, without long-term current use of insulin (HCC)   hgba1c acceptable, minimize simple carbs. Increase exercise as tolerated.  Pt has stopped taking Metformin , AM fasting BS 100s. On ARB FU 3 months recheck A1C      Relevant Medications   tirzepatide  (MOUNJARO ) 2.5 MG/0.5ML Pen   Other Relevant Orders   Hemoglobin A1c   Microalbumin / creatinine urine ratio   Dysuria and urinary frequency Recent urinary symptoms with urgency and frequency.  Urine dipstick negative. Culture ordered.  -Rx-Macrobid   - Ordered urine culture. - Advised follow-up if symptoms persist.      I have discontinued Tiffany Mckenzie's metFORMIN  and Ozempic  (2 MG/DOSE). I am also having her start on nitrofurantoin  (macrocrystal-monohydrate) and tirzepatide . Additionally, I am having her maintain her loratadine, triamcinolone  cream, LORazepam , olmesartan , vitamin B-12, oxybutynin, and  levothyroxine .  Meds ordered this encounter  Medications   nitrofurantoin , macrocrystal-monohydrate, (MACROBID ) 100 MG capsule    Sig: Take 1 capsule (100 mg total) by mouth 2 (two) times daily for 5 days.    Dispense:  10 capsule    Refill:  0    Supervising Provider:   DOMENICA BLACKBIRD A [[id number]]   tirzepatide  (MOUNJARO ) 2.5 MG/0.5ML Pen    Sig: Inject 2.5 mg into the skin once a week.    Dispense:  2 mL    Refill:  3    Supervising Provider:   DOMENICA BLACKBIRD A [[id number]]

## 2024-11-06 NOTE — Assessment & Plan Note (Signed)
 Well controlled, no changes to meds. Encouraged heart healthy diet such as the DASH diet and exercise as tolerated.

## 2024-11-06 NOTE — Assessment & Plan Note (Signed)
 On levothyroxine , continue to monitor.

## 2024-11-06 NOTE — Assessment & Plan Note (Addendum)
 Supplement and monitor

## 2024-11-06 NOTE — Assessment & Plan Note (Signed)
 hgba1c acceptable, minimize simple carbs. Increase exercise as tolerated.  Pt has stopped taking Metformin , AM fasting BS 100s. On ARB FU 3 months recheck A1C

## 2024-11-06 NOTE — Assessment & Plan Note (Signed)
 Patient has ASVD risk calculation > 7.5% and type 2 DM. Statin therapy is recommended since based on Prevent calculation and LDL is > 70.  Discussed starting medication-atorvastatin 20 mg daily

## 2024-11-08 ENCOUNTER — Encounter: Payer: Self-pay | Admitting: Student

## 2024-11-08 ENCOUNTER — Ambulatory Visit: Admitting: Student

## 2024-11-08 VITALS — BP 106/62 | HR 76 | Temp 97.7°F | Ht 62.0 in | Wt 156.2 lb

## 2024-11-08 DIAGNOSIS — R300 Dysuria: Secondary | ICD-10-CM

## 2024-11-08 DIAGNOSIS — F411 Generalized anxiety disorder: Secondary | ICD-10-CM | POA: Diagnosis not present

## 2024-11-08 DIAGNOSIS — E1169 Type 2 diabetes mellitus with other specified complication: Secondary | ICD-10-CM

## 2024-11-08 DIAGNOSIS — N3281 Overactive bladder: Secondary | ICD-10-CM | POA: Diagnosis not present

## 2024-11-08 DIAGNOSIS — E039 Hypothyroidism, unspecified: Secondary | ICD-10-CM

## 2024-11-08 DIAGNOSIS — E119 Type 2 diabetes mellitus without complications: Secondary | ICD-10-CM

## 2024-11-08 DIAGNOSIS — E785 Hyperlipidemia, unspecified: Secondary | ICD-10-CM | POA: Diagnosis not present

## 2024-11-08 DIAGNOSIS — E538 Deficiency of other specified B group vitamins: Secondary | ICD-10-CM | POA: Diagnosis not present

## 2024-11-08 DIAGNOSIS — I10 Essential (primary) hypertension: Secondary | ICD-10-CM

## 2024-11-08 DIAGNOSIS — Z7985 Long-term (current) use of injectable non-insulin antidiabetic drugs: Secondary | ICD-10-CM

## 2024-11-08 DIAGNOSIS — Z0000 Encounter for general adult medical examination without abnormal findings: Secondary | ICD-10-CM | POA: Insufficient documentation

## 2024-11-08 LAB — POC URINALSYSI DIPSTICK (AUTOMATED)
Bilirubin, UA: NEGATIVE
Blood, UA: NEGATIVE
Glucose, UA: NEGATIVE
Ketones, UA: NEGATIVE
Leukocytes, UA: NEGATIVE
Nitrite, UA: NEGATIVE
Protein, UA: NEGATIVE
Spec Grav, UA: <=1.005 — AB (ref 1.010–1.025)
Urobilinogen, UA: 0.2 U/dL
pH, UA: 5 (ref 5.0–8.0)

## 2024-11-08 MED ORDER — NITROFURANTOIN MONOHYD MACRO 100 MG PO CAPS: 100.0000 mg | ORAL_CAPSULE | Freq: Two times a day (BID) | ORAL | 0 refills | Status: AC

## 2024-11-08 MED ORDER — TIRZEPATIDE 2.5 MG/0.5ML ~~LOC~~ SOAJ: 2.5000 mg | SUBCUTANEOUS | 3 refills | Status: AC

## 2024-11-08 NOTE — Assessment & Plan Note (Signed)
 Patient encouraged to maintain heart healthy diet, regular exercise, adequate sleep. Consider daily probiotics. Take medications as prescribed

## 2024-11-09 ENCOUNTER — Other Ambulatory Visit: Payer: Self-pay | Admitting: Family Medicine

## 2024-11-09 LAB — URINE CULTURE
MICRO NUMBER:: 17287260
Result:: NO GROWTH
SPECIMEN QUALITY:: ADEQUATE

## 2024-11-09 LAB — TSH: TSH: 3.36 m[IU]/L (ref 0.40–4.50)

## 2024-11-13 ENCOUNTER — Ambulatory Visit: Payer: Self-pay | Admitting: Student

## 2024-11-13 LAB — CBC WITH DIFFERENTIAL/PLATELET
Absolute Lymphocytes: 1491 {cells}/uL (ref 850–3900)
Absolute Monocytes: 568 {cells}/uL (ref 200–950)
Basophils Absolute: 43 {cells}/uL (ref 0–200)
Basophils Relative: 0.6 %
Eosinophils Absolute: 170 {cells}/uL (ref 15–500)
Eosinophils Relative: 2.4 %
HCT: 34.9 % — ABNORMAL LOW (ref 35.9–46.0)
Hemoglobin: 11.9 g/dL (ref 11.7–15.5)
MCH: 31.1 pg (ref 27.0–33.0)
MCHC: 34.1 g/dL (ref 31.6–35.4)
MCV: 91.1 fL (ref 81.4–101.7)
MPV: 10.9 fL (ref 7.5–12.5)
Monocytes Relative: 8 %
Neutro Abs: 4828 {cells}/uL (ref 1500–7800)
Neutrophils Relative %: 68 %
Platelets: 218 Thousand/uL (ref 140–400)
RBC: 3.83 Million/uL (ref 3.80–5.10)
RDW: 12.7 % (ref 11.0–15.0)
Total Lymphocyte: 21 %
WBC: 7.1 Thousand/uL (ref 3.8–10.8)

## 2024-11-13 LAB — COMPREHENSIVE METABOLIC PANEL WITH GFR
AG Ratio: 1.9 (calc) (ref 1.0–2.5)
ALT: 10 U/L (ref 6–29)
AST: 12 U/L (ref 10–35)
Albumin: 3.9 g/dL (ref 3.6–5.1)
Alkaline phosphatase (APISO): 86 U/L (ref 37–153)
BUN: 15 mg/dL (ref 7–25)
CO2: 27 mmol/L (ref 20–32)
Calcium: 8.6 mg/dL (ref 8.6–10.4)
Chloride: 107 mmol/L (ref 98–110)
Creat: 0.78 mg/dL (ref 0.60–1.00)
Globulin: 2.1 g/dL (ref 1.9–3.7)
Glucose, Bld: 117 mg/dL — ABNORMAL HIGH (ref 65–99)
Potassium: 3.7 mmol/L (ref 3.5–5.3)
Sodium: 140 mmol/L (ref 135–146)
Total Bilirubin: 0.4 mg/dL (ref 0.2–1.2)
Total Protein: 6 g/dL — ABNORMAL LOW (ref 6.1–8.1)
eGFR: 82 mL/min/1.73m2 (ref >=60)

## 2024-11-13 LAB — LIPID PANEL
Cholesterol: 138 mg/dL (ref <200)
HDL: 49 mg/dL — ABNORMAL LOW (ref >=50)
LDL Cholesterol (Calc): 66 mg/dL
Non-HDL Cholesterol (Calc): 89 mg/dL (ref <130)
Total CHOL/HDL Ratio: 2.8 (calc) (ref <5.0)
Triglycerides: 149 mg/dL (ref <150)

## 2024-11-13 LAB — HEMOGLOBIN A1C
Hgb A1c MFr Bld: 5.7 % — ABNORMAL HIGH (ref <5.7)
Mean Plasma Glucose: 117 mg/dL
eAG (mmol/L): 6.5 mmol/L

## 2024-11-13 LAB — MICROALBUMIN / CREATININE URINE RATIO

## 2024-11-17 DIAGNOSIS — L209 Atopic dermatitis, unspecified: Secondary | ICD-10-CM | POA: Diagnosis not present

## 2024-11-17 DIAGNOSIS — L821 Other seborrheic keratosis: Secondary | ICD-10-CM | POA: Diagnosis not present

## 2025-01-12 ENCOUNTER — Other Ambulatory Visit: Payer: Self-pay | Admitting: Student

## 2025-01-12 DIAGNOSIS — E119 Type 2 diabetes mellitus without complications: Secondary | ICD-10-CM

## 2025-01-16 ENCOUNTER — Ambulatory Visit: Admitting: Family Medicine

## 2025-01-16 ENCOUNTER — Telehealth: Admitting: Physician Assistant

## 2025-01-16 ENCOUNTER — Encounter: Payer: Self-pay | Admitting: Family Medicine

## 2025-01-16 ENCOUNTER — Ambulatory Visit: Payer: Self-pay | Admitting: *Deleted

## 2025-01-16 VITALS — BP 109/53 | HR 74 | Ht 62.0 in | Wt 154.0 lb

## 2025-01-16 DIAGNOSIS — N3000 Acute cystitis without hematuria: Secondary | ICD-10-CM

## 2025-01-16 DIAGNOSIS — R300 Dysuria: Secondary | ICD-10-CM

## 2025-01-16 DIAGNOSIS — R3989 Other symptoms and signs involving the genitourinary system: Secondary | ICD-10-CM

## 2025-01-16 LAB — POCT URINALYSIS DIP (CLINITEK)
Blood, UA: NEGATIVE
Glucose, UA: 100 mg/dL — AB
Nitrite, UA: POSITIVE — AB
POC PROTEIN,UA: 30 — AB
Spec Grav, UA: 1.02
Urobilinogen, UA: 2 U/dL — AB
pH, UA: 5

## 2025-01-16 MED ORDER — SULFAMETHOXAZOLE-TRIMETHOPRIM 800-160 MG PO TABS: 1.0000 | ORAL_TABLET | Freq: Two times a day (BID) | ORAL | 0 refills | Status: AC

## 2025-01-16 NOTE — Progress Notes (Signed)
 "  Acute Office Visit  Patient ID: Tiffany Mckenzie, female    DOB: September 14, 1956, 69 y.o.   MRN: [id number]  PCP: Domenica Harlene DELENA, MD  Chief Complaint  Patient presents with   Dysuria    Subjective:     HPI  Discussed the use of AI scribe software for clinical note transcription with the patient, who gave verbal consent to proceed.  History of Present Illness Tiffany Mckenzie is a 69 year old female with recurrent urinary tract infections who presents with urinary symptoms.  Urinary symptoms - Dysuria began on Sunday - Using AZO for pain relief, resulting in orange urine discoloration - No hematuria observed - No changes in urine odor - No abdominal pain, nausea, vomiting, or back pain - No fever or chills  Recurrent urinary tract infections - Recent urinary tract infection a couple of weeks ago, completed course of Keflex  - Uncertain if urine culture was performed during last infection - Another UTI occurred a couple of months ago - Experiencing increased frequency of UTIs, with the last three being the most frequent in a year  Urinalysis findings - Urine test positive for leukocytes and nitrates  Gynecologic history - Postmenopausal, approximately fifteen years since last menstrual period  Topical medication use - Uses betamethasone  cream in groin area daily for unrelated issue   ROS     Objective:    BP (!) 109/53   Pulse 74   Ht 5' 2 (1.575 m)   Wt 154 lb (69.9 kg)   SpO2 97%   BMI 28.17 kg/m    Physical Exam    Results for orders placed or performed in visit on 01/16/25  POCT URINALYSIS DIP (CLINITEK)  Result Value Ref Range   Color, UA other (A) yellow   Clarity, UA clear clear   Glucose, UA =100 (A) negative mg/dL   Bilirubin, UA small (A) negative   Ketones, POC UA trace (5) (A) negative mg/dL   Spec Grav, UA 8.979 8.989 - 1.025   Blood, UA negative negative   pH, UA 5.0 5.0 - 8.0   POC PROTEIN,UA =30 (A) negative, trace   Urobilinogen, UA 2.0  (A) 0.2 or 1.0 E.U./dL   Nitrite, UA Positive (A) Negative   Leukocytes, UA Small (1+) (A) Negative       Assessment & Plan:   Problem List Items Addressed This Visit       Other   Dysuria - Primary   Relevant Orders   POCT URINALYSIS DIP (CLINITEK) (Completed)   Urine Culture   Other Visit Diagnoses       Acute cystitis without hematuria       Relevant Medications   sulfamethoxazole -trimethoprim  (BACTRIM  DS) 800-160 MG tablet       Assessment and Plan Assessment & Plan Recurrent urinary tract infections Recurrent UTIs with recent episode. Positive leukocytes and nitrates in urine. Recent Keflex  use may necessitate different antibiotic. Discussed culture for antibiotic identification and long-term management strategies. - Prescribed a different antibiotic than Keflex . - Sent urine for culture. - Discussed long-term management strategies: hydration, avoiding sugar, considering Uqora. - Consider vaginal estrogen cream if UTIs persist post-menopause.    Meds ordered this encounter  Medications   sulfamethoxazole -trimethoprim  (BACTRIM  DS) 800-160 MG tablet    Sig: Take 1 tablet by mouth 2 (two) times daily.    Dispense:  6 tablet    Refill:  0    Return if symptoms worsen or fail to improve.  Dorothyann Byars, MD  [facility] at [facility]   "

## 2025-01-16 NOTE — Telephone Encounter (Signed)
 FYI Only or Action Required?: FYI only for provider: appointment scheduled on 2/3.  Patient was last seen in primary care on 11/08/2024 by Tiffany Harlene CROME, NP.  Called Nurse Triage reporting urinary pain.  Symptoms began several days ago.  Interventions attempted: Prescription medications: AZO.  Symptoms are: gradually worsening.  Triage Disposition: See HCP Within 4 Hours (Or PCP Triage)  Patient/caregiver understands and will follow disposition?: Yes   Message from [facility] sent at 01/16/2025 10:48 AM EST  Summary: burning with urination   Reason for Triage: Patient states that she is having burning with urination, frequency, no abdominal pain for 2 days.         Reason for Disposition  Diabetes mellitus or weak immune system (e.g., HIV positive, cancer chemo, splenectomy, organ transplant, chronic steroids)  Answer Assessment - Initial Assessment Questions 1. SEVERITY: How bad is the pain?  (e.g., Scale 1-10; mild, moderate, or severe)     Using AZO- which has helped painful urination 2. FREQUENCY: How many times have you had painful urination today?      Took AZO- so that has helped 3. PATTERN: Is pain present every time you urinate or just sometimes?      yes 4. ONSET: When did the painful urination start?      Sunday 5. FEVER: Do you have a fever? If Yes, ask: What is your temperature, how was it measured, and when did it start?     no 6. PAST UTI: Have you had a urine infection before? If Yes, ask: When was the last time? and What happened that time?      Yes- 1 month ago- patient was treated with Keflex  7. CAUSE: What do you think is causing the painful urination?  (e.g., UTI, scratch, Herpes sore)     UTI 8. OTHER SYMPTOMS: Do you have any other symptoms? (e.g., blood in urine, flank pain, genital sores, urgency, vaginal discharge)     Frequency, urgency  Protocols used: Urination Pain - Female-A-AH

## 2025-01-18 ENCOUNTER — Other Ambulatory Visit (HOSPITAL_COMMUNITY): Payer: Self-pay

## 2025-01-18 NOTE — Telephone Encounter (Signed)
 I tried a test claim for Mounjaro  2.5mg  and it came back as a max quantity of 4 in 365 days. The 2.5mg  dose is the starting dose and the patient normally takes that dose for 4 weeks then increase to the 5mg  dose.   Test claim for Mounjaro  5mg  came back as a copay of $627.76, $500 applied to deductible and $127.76 amount of coinsurance.

## 2025-01-19 ENCOUNTER — Ambulatory Visit: Payer: Self-pay | Admitting: Family Medicine

## 2025-01-19 LAB — URINE CULTURE

## 2025-01-19 NOTE — Progress Notes (Signed)
 Hi Tiffany Mckenzie, your urine culture did come back positive for a bacteria called E. coli.  This is a very common urinary tract bug.  The good news is the antibiotic that you are on is the correct one.  It should take care of this infection and color cleared up completely.  Fully you are feeling better already.

## 2025-02-15 ENCOUNTER — Ambulatory Visit: Admitting: Student

## 2025-02-15 ENCOUNTER — Ambulatory Visit: Admitting: Family Medicine

## 2025-08-21 ENCOUNTER — Ambulatory Visit
